# Patient Record
Sex: Female | Born: 1960 | Race: White | Hispanic: No | Marital: Married | Smoking: Never smoker
Health system: Southern US, Community
[De-identification: ages and names within clinical notes are randomized; demographics above are authoritative.]

## PROBLEM LIST (undated history)

## (undated) DIAGNOSIS — K219 Gastro-esophageal reflux disease without esophagitis: Secondary | ICD-10-CM

## (undated) DIAGNOSIS — E538 Deficiency of other specified B group vitamins: Secondary | ICD-10-CM

## (undated) DIAGNOSIS — C541 Malignant neoplasm of endometrium: Secondary | ICD-10-CM

---

## 2010-11-10 ENCOUNTER — Encounter (INDEPENDENT_AMBULATORY_CARE_PROVIDER_SITE_OTHER): Payer: BC Managed Care – PPO | Admitting: Ophthalmology

## 2010-11-10 DIAGNOSIS — H35439 Paving stone degeneration of retina, unspecified eye: Secondary | ICD-10-CM

## 2010-11-10 DIAGNOSIS — H2510 Age-related nuclear cataract, unspecified eye: Secondary | ICD-10-CM

## 2010-11-10 DIAGNOSIS — H43819 Vitreous degeneration, unspecified eye: Secondary | ICD-10-CM

## 2013-10-28 DIAGNOSIS — C541 Malignant neoplasm of endometrium: Secondary | ICD-10-CM | POA: Insufficient documentation

## 2017-10-04 ENCOUNTER — Encounter: Payer: Self-pay | Admitting: Emergency Medicine

## 2017-10-04 ENCOUNTER — Other Ambulatory Visit: Payer: Self-pay

## 2017-10-04 ENCOUNTER — Emergency Department
Admission: EM | Admit: 2017-10-04 | Discharge: 2017-10-04 | Disposition: A | Discharge status: Home / Self Care | Payer: Worker's Compensation | Attending: Emergency Medicine | Admitting: Emergency Medicine

## 2017-10-04 ENCOUNTER — Emergency Department: Payer: Worker's Compensation

## 2017-10-04 DIAGNOSIS — S8991XA Unspecified injury of right lower leg, initial encounter: Secondary | ICD-10-CM | POA: Diagnosis present

## 2017-10-04 DIAGNOSIS — W010XXA Fall on same level from slipping, tripping and stumbling without subsequent striking against object, initial encounter: Secondary | ICD-10-CM | POA: Insufficient documentation

## 2017-10-04 DIAGNOSIS — Y939 Activity, unspecified: Secondary | ICD-10-CM | POA: Diagnosis not present

## 2017-10-04 DIAGNOSIS — Y999 Unspecified external cause status: Secondary | ICD-10-CM | POA: Insufficient documentation

## 2017-10-04 DIAGNOSIS — G8911 Acute pain due to trauma: Secondary | ICD-10-CM | POA: Insufficient documentation

## 2017-10-04 DIAGNOSIS — S86911A Strain of unspecified muscle(s) and tendon(s) at lower leg level, right leg, initial encounter: Secondary | ICD-10-CM

## 2017-10-04 DIAGNOSIS — Z79899 Other long term (current) drug therapy: Secondary | ICD-10-CM | POA: Insufficient documentation

## 2017-10-04 DIAGNOSIS — S8391XA Sprain of unspecified site of right knee, initial encounter: Secondary | ICD-10-CM | POA: Diagnosis not present

## 2017-10-04 DIAGNOSIS — Y929 Unspecified place or not applicable: Secondary | ICD-10-CM | POA: Insufficient documentation

## 2017-10-04 DIAGNOSIS — M79651 Pain in right thigh: Secondary | ICD-10-CM | POA: Diagnosis not present

## 2017-10-04 DIAGNOSIS — R52 Pain, unspecified: Secondary | ICD-10-CM

## 2017-10-04 MED ORDER — TRAMADOL HCL 50 MG PO TABS: 50.0000 mg | ORAL_TABLET | Freq: Four times a day (QID) | ORAL | 0 refills | Status: DC | PRN

## 2017-10-04 NOTE — ED Triage Notes (Signed)
Brought in via ems s/p fall    States she developed a pulled a muscle in July  States was feel better  this am her right leg gave out and she fell  States pain is posterior and  Moves from hip into knee   Took 2 ibu PTA

## 2017-10-04 NOTE — ED Provider Notes (Signed)
[facility] Emergency Department Provider Note  ____________________________________________   First MD Initiated Contact with Patient 10/04/17 0900     (approximate)  I have reviewed the triage vital signs and the nursing notes.   HISTORY  Chief Complaint No chief complaint on file.    HPI Emily Soto is a 57 y.o. female presents to the emergency department via EMS after a fall.  Patient states that she pulled a muscle in July to her right leg.  She states that today her leg gave out from under her causing her to fall.  She states that she has pain posteriorly that moves from her hip into her knee.  Patient took 2 ibuprofen prior to arrival.  She denies any head injury or loss of consciousness during this event.  She rates her pain as a 10/10 and states she is unable to bear weight.  Reports that she was sitting in a chair on their arrival to the scene.   History reviewed. No pertinent past medical history.  There are no active problems to display for this patient.   History reviewed. No pertinent surgical history.  Prior to Admission medications   Medication Sig Start Date End Date Taking? Authorizing Provider  cholecalciferol (VITAMIN D) 1000 units tablet Take 1,000 Units by mouth daily.   Yes [provider]  esomeprazole (NEXIUM) 40 MG capsule Take 40 mg by mouth daily at 12 noon.   Yes [provider]  ranitidine (ZANTAC) 150 MG tablet Take 150 mg by mouth 2 (two) times daily.   Yes [provider]  traMADol (ULTRAM) 50 MG tablet Take 1 tablet (50 mg total) by mouth every 6 (six) hours as needed for moderate pain. 10/04/17   Johnn Hai, PA-C    Allergies Prednisone; Avelox [moxifloxacin hcl]; Epinephrine; and Morphine and related  No family history on file.  Social History Social History   Tobacco Use  . Smoking status: Never Smoker  . Smokeless tobacco: Never Used  Substance Use Topics  . Alcohol use:  Never    Frequency: Never  . Drug use: Never    Review of Systems Constitutional: No fever/chills Eyes: No visual changes. ENT: No sore throat. Cardiovascular: Denies chest pain. Respiratory: Denies shortness of breath. Gastrointestinal: No abdominal pain.  No nausea, no vomiting.  No diarrhea.  No constipation. Genitourinary: Negative for dysuria. Musculoskeletal: Negative for back pain. Skin: Negative for rash. Neurological: Negative for headaches, focal weakness or numbness.   ____________________________________________   PHYSICAL EXAM:  VITAL SIGNS: ED Triage Vitals  Enc Vitals Group     BP      Pulse      Resp      Temp      Temp src      SpO2      Weight      Height      Head Circumference      Peak Flow      Pain Score      Pain Loc      Pain Edu?      Excl. in [place]?    Constitutional: Alert and oriented. Well appearing and in no acute distress. Eyes: Conjunctivae are normal. PERRL. EOMI. Head: Atraumatic. Neck: No stridor.   Cardiovascular: Normal rate, regular rhythm. Grossly normal heart sounds.  Good peripheral circulation. Respiratory: Normal respiratory effort.  No retractions. Lungs CTAB. Musculoskeletal: On examination of the right hip and knee there is no gross deformity however there is large  body habitus which makes it difficult to completely evaluate.  There is no appreciated effusion of her right knee.  Range of motion is difficult to evaluate secondary to patient's pain.  Ligaments appear to be stable bilaterally.  No edema or deformity noted of the ankle and palpation of the anterior tib-fib is unremarkable.  Gait was not tested secondary to patient's pain. Neurologic:  Normal speech and language. No gross focal neurologic deficits are appreciated.  Skin:  Skin is warm, dry and intact.  No ecchymosis or abrasions were seen. Psychiatric: Mood and affect are normal. Speech and behavior are normal.  ____________________________________________     LABS (all labs ordered are listed, but only abnormal results are displayed)  Labs Reviewed - No data to display  RADIOLOGY   Official radiology report(s): No results found.  ____________________________________________   PROCEDURES  Procedure(s) performed: None  Procedures  Critical Care performed: No  ____________________________________________   INITIAL IMPRESSION / ASSESSMENT AND PLAN / ED COURSE  As part of my medical decision making, I reviewed the following data within the electronic MEDICAL RECORD NUMBER Notes from prior ED visits and [place] Controlled Substance Database  Patient was placed in a knee immobilizer and given crutches.  She was offered a tramadol in the emergency department which she refused stating that she has strange reactions to medications and would prefer to take the first 1 at home.  Her husband also tried to encourage her to take it here in the emergency room as she appeared to be in a great deal of pain but patient still refused.  Patient is to follow-up with Dr. Marry Guan who is on-call for orthopedics if any continued problems.  Is encouraged to ice and elevate and wear the knee immobilizer.  ____________________________________________   FINAL CLINICAL IMPRESSION(S) / ED DIAGNOSES  Final diagnoses:  Strain of right knee and leg, initial encounter  Acute pain     ED Discharge Orders         Ordered    traMADol (ULTRAM) 50 MG tablet  Every 6 hours PRN     10/04/17 1015           Note:  This document was prepared using Dragon voice recognition software and may include unintentional dictation errors.    Johnn Hai, PA-C 10/07/17 1032    Darel Hong, MD 10/07/17 1743

## 2017-10-04 NOTE — Discharge Instructions (Addendum)
Ice and elevate as needed for pain.  You may also continue taking ibuprofen/Advil 3 times a day with food.  Tylenol can be used for additional pain relief.  Wear knee immobilizer for support and use crutches when walking to prevent further injury. Follow-up with Dr. Marry Guan who is the orthopedist on call.  You will need to call make an appointment with his office.

## 2018-12-09 ENCOUNTER — Inpatient Hospital Stay: Payer: BC Managed Care – PPO

## 2018-12-09 ENCOUNTER — Observation Stay (HOSPITAL_BASED_OUTPATIENT_CLINIC_OR_DEPARTMENT_OTHER)
Admit: 2018-12-09 | Discharge: 2018-12-09 | Disposition: A | Discharge status: Home / Self Care | Payer: BC Managed Care – PPO | Attending: Internal Medicine | Admitting: Internal Medicine

## 2018-12-09 ENCOUNTER — Emergency Department: Payer: BC Managed Care – PPO

## 2018-12-09 ENCOUNTER — Other Ambulatory Visit: Payer: Self-pay

## 2018-12-09 ENCOUNTER — Observation Stay: Payer: BC Managed Care – PPO

## 2018-12-09 ENCOUNTER — Encounter: Payer: Self-pay | Admitting: Internal Medicine

## 2018-12-09 ENCOUNTER — Inpatient Hospital Stay
Admission: EM | Admit: 2018-12-09 | Discharge: 2018-12-10 | DRG: 065 | Disposition: A | Discharge status: Home / Self Care | Payer: BC Managed Care – PPO | Attending: Internal Medicine | Admitting: Internal Medicine

## 2018-12-09 DIAGNOSIS — E785 Hyperlipidemia, unspecified: Secondary | ICD-10-CM | POA: Diagnosis present

## 2018-12-09 DIAGNOSIS — K219 Gastro-esophageal reflux disease without esophagitis: Secondary | ICD-10-CM | POA: Diagnosis present

## 2018-12-09 DIAGNOSIS — Z20828 Contact with and (suspected) exposure to other viral communicable diseases: Secondary | ICD-10-CM | POA: Diagnosis present

## 2018-12-09 DIAGNOSIS — Z6841 Body Mass Index (BMI) 40.0 and over, adult: Secondary | ICD-10-CM | POA: Diagnosis not present

## 2018-12-09 DIAGNOSIS — G8194 Hemiplegia, unspecified affecting left nondominant side: Secondary | ICD-10-CM | POA: Diagnosis present

## 2018-12-09 DIAGNOSIS — E876 Hypokalemia: Secondary | ICD-10-CM | POA: Diagnosis present

## 2018-12-09 DIAGNOSIS — R29898 Other symptoms and signs involving the musculoskeletal system: Secondary | ICD-10-CM | POA: Diagnosis present

## 2018-12-09 DIAGNOSIS — R29700 NIHSS score 0: Secondary | ICD-10-CM | POA: Diagnosis present

## 2018-12-09 DIAGNOSIS — R918 Other nonspecific abnormal finding of lung field: Secondary | ICD-10-CM | POA: Diagnosis present

## 2018-12-09 DIAGNOSIS — Z8542 Personal history of malignant neoplasm of other parts of uterus: Secondary | ICD-10-CM

## 2018-12-09 DIAGNOSIS — I6389 Other cerebral infarction: Secondary | ICD-10-CM | POA: Diagnosis not present

## 2018-12-09 DIAGNOSIS — Z880 Allergy status to penicillin: Secondary | ICD-10-CM | POA: Diagnosis not present

## 2018-12-09 DIAGNOSIS — Z801 Family history of malignant neoplasm of trachea, bronchus and lung: Secondary | ICD-10-CM | POA: Diagnosis not present

## 2018-12-09 DIAGNOSIS — E669 Obesity, unspecified: Secondary | ICD-10-CM | POA: Diagnosis present

## 2018-12-09 DIAGNOSIS — I639 Cerebral infarction, unspecified: Secondary | ICD-10-CM | POA: Diagnosis present

## 2018-12-09 HISTORY — DX: Deficiency of other specified B group vitamins: E53.8

## 2018-12-09 HISTORY — DX: Gastro-esophageal reflux disease without esophagitis: K21.9

## 2018-12-09 HISTORY — DX: Malignant neoplasm of endometrium: C54.1

## 2018-12-09 LAB — LIPID PANEL
Cholesterol: 223 mg/dL — ABNORMAL HIGH (ref 0–200)
HDL: 51 mg/dL (ref >40)
LDL Cholesterol: 160 mg/dL — ABNORMAL HIGH (ref 0–99)
Total CHOL/HDL Ratio: 4.4 RATIO
Triglycerides: 60 mg/dL (ref <150)
VLDL: 12 mg/dL (ref 0–40)

## 2018-12-09 LAB — URINALYSIS, COMPLETE (UACMP) WITH MICROSCOPIC
Bilirubin Urine: NEGATIVE
Glucose, UA: NEGATIVE mg/dL
Hgb urine dipstick: NEGATIVE
Ketones, ur: NEGATIVE mg/dL
Nitrite: NEGATIVE
Protein, ur: NEGATIVE mg/dL
Specific Gravity, Urine: 1.004 — ABNORMAL LOW (ref 1.005–1.030)
pH: 6 (ref 5.0–8.0)

## 2018-12-09 LAB — ETHANOL: Alcohol, Ethyl (B): <10 mg/dL (ref <10)

## 2018-12-09 LAB — CBC WITH DIFFERENTIAL/PLATELET
Abs Immature Granulocytes: 0.02 10*3/uL (ref 0.00–0.07)
Basophils Absolute: 0.1 10*3/uL (ref 0.0–0.1)
Basophils Relative: 1 %
Eosinophils Absolute: 0.3 10*3/uL (ref 0.0–0.5)
Eosinophils Relative: 3 %
HCT: 43.4 % (ref 36.0–46.0)
Hemoglobin: 14.3 g/dL (ref 12.0–15.0)
Immature Granulocytes: 0 %
Lymphocytes Relative: 40 %
Lymphs Abs: 3.5 10*3/uL (ref 0.7–4.0)
MCH: 29.2 pg (ref 26.0–34.0)
MCHC: 32.9 g/dL (ref 30.0–36.0)
MCV: 88.8 fL (ref 80.0–100.0)
Monocytes Absolute: 0.5 10*3/uL (ref 0.1–1.0)
Monocytes Relative: 6 %
Neutro Abs: 4.6 10*3/uL (ref 1.7–7.7)
Neutrophils Relative %: 50 %
Platelets: 211 10*3/uL (ref 150–400)
RBC: 4.89 MIL/uL (ref 3.87–5.11)
RDW: 13.6 % (ref 11.5–15.5)
WBC: 8.9 10*3/uL (ref 4.0–10.5)
nRBC: 0 % (ref 0.0–0.2)

## 2018-12-09 LAB — URINE DRUG SCREEN, QUALITATIVE (ARMC ONLY)
Amphetamines, Ur Screen: NOT DETECTED
Barbiturates, Ur Screen: NOT DETECTED
Benzodiazepine, Ur Scrn: NOT DETECTED
Cannabinoid 50 Ng, Ur ~~LOC~~: NOT DETECTED
Cocaine Metabolite,Ur ~~LOC~~: NOT DETECTED
MDMA (Ecstasy)Ur Screen: NOT DETECTED
Methadone Scn, Ur: NOT DETECTED
Opiate, Ur Screen: NOT DETECTED
Phencyclidine (PCP) Ur S: NOT DETECTED
Tricyclic, Ur Screen: NOT DETECTED

## 2018-12-09 LAB — PROTIME-INR
INR: 0.9 (ref 0.8–1.2)
Prothrombin Time: 12.4 seconds (ref 11.4–15.2)

## 2018-12-09 LAB — SARS CORONAVIRUS 2 (TAT 6-24 HRS): SARS Coronavirus 2: NEGATIVE

## 2018-12-09 LAB — COMPREHENSIVE METABOLIC PANEL
ALT: 10 U/L (ref 0–44)
AST: 19 U/L (ref 15–41)
Albumin: 4.2 g/dL (ref 3.5–5.0)
Alkaline Phosphatase: 71 U/L (ref 38–126)
Anion gap: 11 (ref 5–15)
BUN: 16 mg/dL (ref 6–20)
CO2: 24 mmol/L (ref 22–32)
Calcium: 9 mg/dL (ref 8.9–10.3)
Chloride: 107 mmol/L (ref 98–111)
Creatinine, Ser: 0.87 mg/dL (ref 0.44–1.00)
GFR calc Af Amer: >60 mL/min (ref >60)
GFR calc non Af Amer: >60 mL/min (ref >60)
Glucose, Bld: 131 mg/dL — ABNORMAL HIGH (ref 70–99)
Potassium: 3.2 mmol/L — ABNORMAL LOW (ref 3.5–5.1)
Sodium: 142 mmol/L (ref 135–145)
Total Bilirubin: 0.6 mg/dL (ref 0.3–1.2)
Total Protein: 7.2 g/dL (ref 6.5–8.1)

## 2018-12-09 LAB — HEMOGLOBIN A1C
Hgb A1c MFr Bld: 5.5 % (ref 4.8–5.6)
Mean Plasma Glucose: 111.15 mg/dL

## 2018-12-09 LAB — ECHOCARDIOGRAM COMPLETE
Height: 67 in
Weight: 4257.6 oz

## 2018-12-09 LAB — HIV ANTIBODY (ROUTINE TESTING W REFLEX): HIV Screen 4th Generation wRfx: NONREACTIVE

## 2018-12-09 MED ORDER — ATORVASTATIN CALCIUM 20 MG PO TABS
40.0000 mg | ORAL_TABLET | Freq: Every day | ORAL | Status: DC
  Administered 2018-12-09: 40 mg via ORAL
  Filled 2018-12-09: qty 2

## 2018-12-09 MED ORDER — PANTOPRAZOLE SODIUM 40 MG PO TBEC
40.0000 mg | DELAYED_RELEASE_TABLET | Freq: Every day | ORAL | Status: DC
  Administered 2018-12-09 – 2018-12-10 (×2): 40 mg via ORAL
  Filled 2018-12-09 (×2): qty 1

## 2018-12-09 MED ORDER — ACETAMINOPHEN 325 MG PO TABS: 650.0000 mg | ORAL_TABLET | ORAL | Status: DC | PRN

## 2018-12-09 MED ORDER — ASPIRIN 325 MG PO TABS
325.0000 mg | ORAL_TABLET | Freq: Every day | ORAL | Status: DC
  Administered 2018-12-10: 325 mg via ORAL
  Filled 2018-12-09: qty 1

## 2018-12-09 MED ORDER — IOHEXOL 350 MG/ML SOLN
75.0000 mL | Freq: Once | INTRAVENOUS | Status: AC | PRN
  Administered 2018-12-09: 16:00:00 75 mL via INTRAVENOUS

## 2018-12-09 MED ORDER — STROKE: EARLY STAGES OF RECOVERY BOOK
Freq: Once | Status: AC
  Administered 2018-12-09: 09:00:00

## 2018-12-09 MED ORDER — ENOXAPARIN SODIUM 40 MG/0.4ML ~~LOC~~ SOLN
40.0000 mg | Freq: Two times a day (BID) | SUBCUTANEOUS | Status: DC
  Administered 2018-12-09 – 2018-12-10 (×2): 40 mg via SUBCUTANEOUS
  Filled 2018-12-09 (×3): qty 0.4

## 2018-12-09 MED ORDER — ASPIRIN 81 MG PO CHEW
324.0000 mg | CHEWABLE_TABLET | Freq: Once | ORAL | Status: AC
  Administered 2018-12-09: 324 mg via ORAL
  Filled 2018-12-09: qty 4

## 2018-12-09 MED ORDER — ONDANSETRON HCL 4 MG/2ML IJ SOLN: 4.0000 mg | Freq: Four times a day (QID) | INTRAMUSCULAR | Status: DC | PRN

## 2018-12-09 MED ORDER — POTASSIUM CHLORIDE CRYS ER 20 MEQ PO TBCR
40.0000 meq | EXTENDED_RELEASE_TABLET | Freq: Once | ORAL | Status: AC
  Administered 2018-12-09: 40 meq via ORAL
  Filled 2018-12-09: qty 2

## 2018-12-09 MED ORDER — ACETAMINOPHEN 650 MG RE SUPP: 650.0000 mg | RECTAL | Status: DC | PRN

## 2018-12-09 MED ORDER — ASPIRIN 300 MG RE SUPP: 300.0000 mg | Freq: Every day | RECTAL | Status: DC

## 2018-12-09 MED ORDER — ACETAMINOPHEN 160 MG/5ML PO SOLN
650.0000 mg | ORAL | Status: DC | PRN
  Filled 2018-12-09: qty 20.3

## 2018-12-09 NOTE — Evaluation (Signed)
Occupational Therapy Evaluation Patient Details Name: Emily Soto MRN: [id number] DOB: Jul 30, 1960 Today's Date: 12/09/2018    History of Present Illness 58 y.o. female noticed left-sided numbness when she got up this morning, mild. MRI pending.   Clinical Impression   Pt seen for OT evaluation this date. 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 Currently pt endorses LUE feeling "not right" and demonstrates mild impairments in fine motor coordination impacting her ADL and IADL ability. Pt also noted that over the past 3 months or so, she has experienced increased fatigue and intermittent weakness of bilat UEs and LEs along with associated tingling in the back of her neck. 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 RN notified. Functional mobility assessment limited 2/2 staff in room to take pt down for MRI. Per SLP who saw pt prior to OT, pt ambulating without difficulty. Will continue to assess.  Pt would benefit from skilled OT to address noted impairments and functional limitations (see below for any additional details) in order to maximize safety and independence while minimizing falls risk and caregiver burden.  Upon hospital discharge, recommend pt discharge with OP OT services to address LUE. Will continue to assess.     Follow Up Recommendations  Outpatient OT    Equipment Recommendations  None recommended by OT    Recommendations for Other Services       Precautions / Restrictions Precautions Precautions: None Restrictions Weight Bearing Restrictions: No      Mobility Bed Mobility               General bed mobility comments: deferred, staff came to take pt for MRI  Transfers                 General transfer comment: deferred, staff came to take pt for MRI    Balance Overall balance  assessment: (unable to formally assess 2/2 being taken for MRI, per SLP who saw pt, pt was ambulating without difficulty in room prior to OT's arrival)                                         ADL either performed or assessed with clinical judgement   ADL Overall ADL's : Needs assistance/impaired                                       General ADL Comments: supervision assist when performing while in standing for safety, but generally near baseline (assessment shortened 2/2 being taken out for MRI)     Vision Baseline Vision/History: Wears glasses Wears Glasses: At all times Patient Visual Report: No change from baseline Vision Assessment?: No apparent visual deficits     Perception     Praxis      Pertinent Vitals/Pain Pain Assessment: No/denies pain     Hand Dominance Right   Extremity/Trunk Assessment Upper Extremity Assessment Upper Extremity Assessment: LUE deficits/detail(RUE WFL) LUE Deficits / Details: strength 5/5, mild FMC deficits noted with testing, no drift, no sensory deficits LUE Sensation: WNL LUE Coordination: decreased fine motor   Lower Extremity Assessment Lower Extremity Assessment: Defer to PT evaluation(unable to formally test 2/2  pt going down for MRI)   Cervical / Trunk Assessment Cervical / Trunk Assessment: Other exceptions Cervical / Trunk Exceptions: pt endorses intermittent tingling on the back of her neck over the past 3 months with intermittent bilateral mild weakness noted; RN notified   Communication Communication Communication: No difficulties   Cognition Arousal/Alertness: Awake/alert Behavior During Therapy: WFL for tasks assessed/performed Overall Cognitive Status: Within Functional Limits for tasks assessed                                     General Comments       Exercises Other Exercises Other Exercises: Pt/spouse briefly instructed in [facility] ex for LUE; will bringh [facility] ex  handout for pt   Shoulder Instructions      Home Living Family/patient expects to be discharged to:: Private residence Living Arrangements: Spouse/significant other Available Help at Discharge: Family;Available 24 hours/day Type of Home: House Home Access: Stairs to enter [employer] of Steps: 5-7 Entrance Stairs-Rails: Right;Left Home Layout: Two level;Bed/bath upstairs     Bathroom Shower/Tub: Tub only;Walk-in shower   Bathroom Toilet: Standard     Home Equipment: None          Prior Functioning/Environment Level of Independence: Independent        Comments: Pt indep, working as a Pharmacist, 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, writing, white board work, Data processing manager work), 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        OT Problem List: Decreased coordination      OT Treatment/Interventions: Field seismologist;Therapeutic exercise;Therapeutic activities;Neuromuscular education;DME and/or AE instruction;Patient/family education    OT Goals(Current goals can be found in the care plan section) Acute Rehab OT Goals Patient Stated Goal: 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 Goal Formulation: With patient/family Time For Goal Achievement: 12/23/18 Potential to Achieve Goals: Good ADL Goals Pt/caregiver will Perform Home Exercise Program: Left upper extremity;With written HEP provided;Independently(LUE FMC)  OT Frequency: Min 1X/week   Barriers to D/C:            Co-evaluation              AM-PAC OT "6 Clicks" Daily Activity     Outcome Measure Help from another person eating meals?: None Help from another person taking care of personal grooming?: None Help from another person toileting, which includes using toliet, bedpan, or urinal?: None Help from another person bathing (including washing, rinsing, drying)?: None Help from another person to put on and taking off regular upper body clothing?: None Help from another person to put on and taking off regular lower  body clothing?: None 6 Click Score: 24   End of Session    Activity Tolerance: Patient tolerated treatment well Patient left: in bed;with call bell/phone within reach;with family/visitor present;with nursing/sitter in room  OT Visit Diagnosis: Other abnormalities of gait and mobility (R26.89);Hemiplegia and hemiparesis Hemiplegia - Right/Left: Left Hemiplegia - dominant/non-dominant: Non-Dominant Hemiplegia - caused by: Unspecified                Time: [id number] OT Time Calculation (min): 10 min Charges:  OT General Charges $OT Visit: 1 Visit OT Evaluation $OT Eval Low Complexity: 1 Low  Jeni Salles, MPH, MS, OTR/L ascom [phone] 12/09/18, 10:51 AM

## 2018-12-09 NOTE — Progress Notes (Signed)
*  PRELIMINARY RESULTS* Echocardiogram 2D Echocardiogram has been performed.  Sherrie Sport 12/09/2018, 1:56 PM

## 2018-12-09 NOTE — Evaluation (Signed)
Physical Therapy Evaluation Patient Details Name: Emily Soto MRN: [id number] DOB: [birth year] Today's Date: 12/09/2018   History of Present Illness  From MD H&P:  Pt is a 58 y.o. female with a known history of GERD, B12 deficiency presented with left arm and leg weakness that she noticed on waking up.  MD assessment includes: small right frontoparietal acute infarct, left subclavian artery stenosis, Hypokalemia, and HLD.    Clinical Impression  Pt presented with min deficits in LLE functional strength, gait, balance, and activity tolerance.  No deficits noted during MMT LLE vs RLE but pt reported feeling slightly weaker on the LLE during gait, transfers, and stair training.  No deficits with VOR, visual tracking, or peripheral vision.  Sensation to light touch intact to all extremities. Coordination nearly equal L vs R to UEs and LEs with pt noting that fine motor felt more challenging on the LUE compared to the RUE.  Pt was Ind with all bed mobility tasks and demonstrated good control during transfers.  Pt was able to amb 200' with a combination of using a RW initially and then progressing to using one UE on a rail in the hall.  Pt reported feeling steadier and safer with the RW and ambulated with slow cadence and short B step length with both.  Pt did show some functional weakness descending stairs with only fair eccentric control but was safe with the use of the rails.  Will see patient 2x/wk secondary to pt being at a high level functionally.  Pt will benefit from OPPT services upon discharge to safely address above deficits for decreased caregiver assistance and eventual return to PLOF.        Follow Up Recommendations Outpatient PT    Equipment Recommendations  Rolling walker with 5" wheels    Recommendations for Other Services       Precautions / Restrictions Precautions Precautions: None Restrictions Weight Bearing Restrictions: No      Mobility  Bed Mobility Overal bed  mobility: Independent             General bed mobility comments: Good speed and effort with all bed mobility tasks  Transfers Overall transfer level: Independent               General transfer comment: Good eccentric and concentric control with sit to/from stand transfers  Ambulation/Gait Ambulation/Gait assistance: Supervision Gait Distance (Feet): 200 Feet Assistive device: Rolling walker (2 wheeled) Gait Pattern/deviations: Step-through pattern;Decreased step length - right;Decreased step length - left Gait velocity: decreased   General Gait Details: Cautious cadence with decreased BLE step length but steady without LOB both with the using a RW and with only one UE on a hand rail in the hall  Stairs Stairs: Yes Stairs assistance: Min guard;Supervision Stair Management: Two rails Number of Stairs: 4 General stair comments: Good concentric control ascending stairs and fair eccentric control desending; min verbal and visual cues for proper sequencing  Wheelchair Mobility    Modified Rankin (Stroke Patients Only)       Balance Overall balance assessment: Needs assistance   Sitting balance-Leahy Scale: Normal     Standing balance support: Bilateral upper extremity supported;No upper extremity supported;During functional activity Standing balance-Leahy Scale: Good Standing balance comment: (-) Romberg sign               High Level Balance Comments: Pt steady with feet apart and together with combinations of head turns/head still and eyes open/shut  Pertinent Vitals/Pain Pain Assessment: No/denies pain    Home Living Family/patient expects to be discharged to:: Private residence Living Arrangements: Spouse/significant other Available Help at Discharge: Family;Available 24 hours/day Type of Home: House Home Access: Stairs to enter Entrance Stairs-Rails: Right;Left(can not reach both) Entrance Stairs-Number of Steps: 5-7 Home Layout:  Two level;Bed/bath upstairs Home Equipment: None Additional Comments: Pt said could stay on 1st floor but would only have a couch to sleep on and a 1/2 bath    Prior Function Level of Independence: Independent         Comments: Pt indep, working as a Pharmacist, 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, writing, white board work, Data processing manager work), 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     Hand Dominance   Dominant Hand: Right    Extremity/Trunk Assessment   Upper Extremity Assessment Upper Extremity Assessment: Overall WFL for tasks assessed;RUE deficits/detail;LUE deficits/detail RUE Deficits / Details: Strenth 5/5 RUE Sensation: WNL RUE Coordination: WNL LUE Deficits / Details: Strength 5/5; Pt reports feeling that finger to nose test was more challenging on the LUE but visually was WNL and only minimally slower than her dominant R hand LUE Sensation: WNL LUE Coordination: WNL    Lower Extremity Assessment Lower Extremity Assessment: RLE deficits/detail;LLE deficits/detail RLE Deficits / Details: Strength 5/5 RLE Sensation: WNL RLE Coordination: WNL LLE Deficits / Details: Strength 5/5 LLE Sensation: WNL LLE Coordination: WNL       Communication   Communication: No difficulties  Cognition Arousal/Alertness: Awake/alert Behavior During Therapy: WFL for tasks assessed/performed Overall Cognitive Status: Within Functional Limits for tasks assessed                                        General Comments      Exercises Other Exercises Other Exercises: Pt steady with feet apart and together with combinations of head turns/head still and eyes open/shut   Assessment/Plan    PT Assessment Patient needs continued PT services  PT Problem List Decreased knowledge of use of DME;Decreased balance       PT Treatment Interventions DME instruction;Gait training;Stair training;Functional mobility training;Therapeutic activities;Therapeutic exercise;Balance training;Patient/family education    PT  Goals (Current goals can be found in the Care Plan section)  Acute Rehab PT Goals Patient Stated Goal: "I want my left arm and leg to get back to normal" PT Goal Formulation: With patient Time For Goal Achievement: 12/22/18 Potential to Achieve Goals: Good    Frequency Min 2X/week   Barriers to discharge        Co-evaluation               AM-PAC PT "6 Clicks" Mobility  Outcome Measure Help needed turning from your back to your side while in a flat bed without using bedrails?: None Help needed moving from lying on your back to sitting on the side of a flat bed without using bedrails?: None Help needed moving to and from a bed to a chair (including a wheelchair)?: None Help needed standing up from a chair using your arms (e.g., wheelchair or bedside chair)?: None Help needed to walk in hospital room?: A Little Help needed climbing 3-5 steps with a railing? : A Little 6 Click Score: 22    End of Session Equipment Utilized During Treatment: Gait belt Activity Tolerance: Patient tolerated treatment well Patient left: in bed;with nursing/sitter in room;with bed alarm set;with call bell/phone within reach Nurse Communication: Mobility status  PT Visit Diagnosis: Difficulty in walking, not elsewhere classified (R26.2);Unsteadiness on feet (R26.81)    Time: [id number] PT Time Calculation (min) (ACUTE ONLY): 46 min   Charges:   PT Evaluation $PT Eval Moderate Complexity: 1 Mod PT Treatments $Gait Training: 8-22 mins $Therapeutic Exercise: 8-22 mins        D. Scott Filippo Puls PT, DPT 12/09/18, 5:20 PM

## 2018-12-09 NOTE — Progress Notes (Signed)
SLP Cancellation Note  Patient Details Name: Emily Soto MRN: [id number] DOB: 1960-12-16   Cancelled treatment:       Reason Eval/Treat Not Completed: SLP screened, no needs identified, will sign off(chart reviewed; consulted NSG then met w/ pt/spouse). Pt denied any difficulty swallowing and is currently on a regular diet; tolerates swallowing pills w/ water per NSG. Pt conversed at conversational level w/out deficits noted; pt and spouse denied any speech-language deficits. Pt independently used personal cell phone to call [employer] for a breakfast meal while in room.  No further skilled ST services indicated as pt appears at her baseline. Pt agreed. NSG to reconsult if any change in status while admitted.    Orinda Kenner, MS, CCC-SLP Queena Monrreal 12/09/2018, 11:05 AM

## 2018-12-09 NOTE — Consult Note (Signed)
Referring Physician: Sudini    Chief Complaint: Left arm weakness  HPI: Emily Soto is an 58 y.o. female with a history of B12 deficiency who reports going to bed at baseline on yesterday.  Awakened today and noted difficulty using the left hand and difficulty walking.  Symptoms did not resolve and patient presented for evaluation.  Initial NIHSS of 0.    Date last known well: Date: 12/08/2018 Time last known well: Time: 22:30 tPA Given: No: Outside time window  Past Medical History:  Diagnosis Date  . B12 deficiency   . Endometrial cancer ([place])   . GERD (gastroesophageal reflux disease)     History reviewed. No pertinent surgical history.  Family History  Problem Relation Age of Onset  . Lung cancer Father    Social History:  reports that she has never smoked. She has never used smokeless tobacco. She reports that she does not drink alcohol or use drugs.  Allergies:  Allergies  Allergen Reactions  . Penicillins Hives  . Prednisone Other (See Comments)    Causes tingling to face and increased heart rate  . Avelox [Moxifloxacin Hcl] Palpitations  . Epinephrine Palpitations  . Morphine And Related Palpitations    Medications:  I have reviewed the patient's current medications. Prior to Admission:  Medications Prior to Admission  Medication Sig Dispense Refill Last Dose  . esomeprazole (NEXIUM) 40 MG capsule Take 40 mg by mouth daily at 12 noon.   Past Week at Unknown time  . VITAMIN D-VITAMIN K PO Take 2,000 Units by mouth daily.   12/08/2018 at 1000   Scheduled: . [START ON 12/10/2018] aspirin  300 mg Rectal Daily   Or  . [START ON 12/10/2018] aspirin  325 mg Oral Daily  . atorvastatin  40 mg Oral q1800  . enoxaparin (LOVENOX) injection  40 mg Subcutaneous Q12H  . pantoprazole  40 mg Oral Daily    ROS: History obtained from the patient  General ROS: negative for - chills, fatigue, fever, night sweats, weight gain or weight loss Psychological ROS: negative for  - behavioral disorder, hallucinations, memory difficulties, mood swings or suicidal ideation Ophthalmic ROS: negative for - blurry vision, double vision, eye pain or loss of vision ENT ROS: negative for - epistaxis, nasal discharge, oral lesions, sore throat, tinnitus or vertigo Allergy and Immunology ROS: negative for - hives or itchy/watery eyes Hematological and Lymphatic ROS: negative for - bleeding problems, bruising or swollen lymph nodes Endocrine ROS: negative for - galactorrhea, hair pattern changes, polydipsia/polyuria or temperature intolerance Respiratory ROS: negative for - cough, hemoptysis, shortness of breath or wheezing Cardiovascular ROS: negative for - chest pain, dyspnea on exertion, edema or irregular heartbeat Gastrointestinal ROS: negative for - abdominal pain, diarrhea, hematemesis, nausea/vomiting or stool incontinence Genito-Urinary ROS: negative for - dysuria, hematuria, incontinence or urinary frequency/urgency Musculoskeletal ROS: negative for - joint swelling or muscular weakness Neurological ROS: as noted in HPI Dermatological ROS: negative for rash and skin lesion changes  Physical Examination: Blood pressure 113/71, pulse 73, temperature 98.1 F (36.7 C), temperature source Oral, resp. rate 18, height 5\' 7"  (1.702 m), weight 120.7 kg, SpO2 97 %.  HEENT-  Normocephalic, no lesions, without obvious abnormality.  Normal external eye and conjunctiva.  Normal TM's bilaterally.  Normal auditory canals and external ears. Normal external nose, mucus membranes and septum.  Normal pharynx. Cardiovascular- S1, S2 normal, pulses palpable throughout   Lungs- chest clear, no wheezing, rales, normal symmetric air entry Abdomen- soft, non-tender; bowel sounds normal;  no masses,  no organomegaly Extremities- no edema Lymph-no adenopathy palpable Musculoskeletal-no joint tenderness, deformity or swelling Skin-warm and dry, no hyperpigmentation, vitiligo, or suspicious  lesions  Neurological Examination   Mental Status: Alert, oriented, thought content appropriate.  Speech fluent without evidence of aphasia.  Able to follow 3 step commands without difficulty. Cranial Nerves: II: Discs flat bilaterally; Visual fields grossly normal, pupils equal, round, reactive to light and accommodation III,IV, VI: ptosis not present, extra-ocular motions intact bilaterally V,VII: smile symmetric, facial light touch sensation normal bilaterally VIII: hearing normal bilaterally IX,X: gag reflex present XI: bilateral shoulder shrug XII: midline tongue extension Motor: Right : Upper extremity   5/5    Left:     Upper extremity   5-/5 with RUE drift  Lower extremity   5/5     Lower extremity   5/5 Tone and bulk:normal tone throughout; no atrophy noted Sensory: Pinprick and light touch intact throughout, bilaterally Deep Tendon Reflexes: Symmetric throughout Plantars: Right: downgoing   Left: downgoing Cerebellar: Normal finger-to-nose and normal heel-to-shin testing bilaterally Gait: not tested due to safety concerns  Laboratory Studies:  Basic Metabolic Panel: Recent Labs  Lab 12/09/18 0625  NA 142  K 3.2*  CL 107  CO2 24  GLUCOSE 131*  BUN 16  CREATININE 0.87  CALCIUM 9.0    Liver Function Tests: Recent Labs  Lab 12/09/18 0625  AST 19  ALT 10  ALKPHOS 71  BILITOT 0.6  PROT 7.2  ALBUMIN 4.2   No results for input(s): LIPASE, AMYLASE in the last 168 hours. No results for input(s): AMMONIA in the last 168 hours.  CBC: Recent Labs  Lab 12/09/18 0625  WBC 8.9  NEUTROABS 4.6  HGB 14.3  HCT 43.4  MCV 88.8  PLT 211    Cardiac Enzymes: No results for input(s): CKTOTAL, CKMB, CKMBINDEX, TROPONINI in the last 168 hours.  BNP: Invalid input(s): POCBNP  CBG: No results for input(s): GLUCAP in the last 168 hours.  Microbiology: No results found for this or any previous visit.  Coagulation Studies: Recent Labs    12/09/18 0625   LABPROT 12.4  INR 0.9    Urinalysis:  Recent Labs  Lab 12/09/18 0748  COLORURINE STRAW*  LABSPEC 1.004*  PHURINE 6.0  GLUCOSEU NEGATIVE  HGBUR NEGATIVE  BILIRUBINUR NEGATIVE  KETONESUR NEGATIVE  PROTEINUR NEGATIVE  NITRITE NEGATIVE  LEUKOCYTESUR TRACE*    Lipid Panel:    Component Value Date/Time   CHOL 223 (H) 12/09/2018 0816   TRIG 60 12/09/2018 0816   HDL 51 12/09/2018 0816   CHOLHDL 4.4 12/09/2018 0816   VLDL 12 12/09/2018 0816   LDLCALC 160 (H) 12/09/2018 0816    HgbA1C:  Lab Results  Component Value Date   HGBA1C 5.5 12/09/2018    Urine Drug Screen:      Component Value Date/Time   LABOPIA NONE DETECTED 12/09/2018 0748   COCAINSCRNUR NONE DETECTED 12/09/2018 0748   LABBENZ NONE DETECTED 12/09/2018 0748   AMPHETMU NONE DETECTED 12/09/2018 0748   THCU NONE DETECTED 12/09/2018 0748   LABBARB NONE DETECTED 12/09/2018 0748    Alcohol Level:  Recent Labs  Lab 12/09/18 0625  ETH <10    Other results: EKG: sinus rhythm at 87 bpm.  Imaging: Ct Head Wo Contrast  Result Date: 12/09/2018 CLINICAL DATA:  New onset extremity numbness and weakness during the night. EXAM: CT HEAD WITHOUT CONTRAST TECHNIQUE: Contiguous axial images were obtained from the base of the skull through the vertex without intravenous  contrast. COMPARISON:  None. FINDINGS: Brain: No evidence of acute infarction, hemorrhage, hydrocephalus, extra-axial collection or mass lesion/mass effect. Mild patchy low-density in the cerebral white matter. No specific demyelinating pattern. Normal brain volume. Vascular: No hyperdense vessel or unexpected calcification. Skull: Normal. Negative for fracture or focal lesion. Sinuses/Orbits: No acute finding. IMPRESSION: 1. No acute finding. 2. Mild cerebral white matter disease without specific pattern. Electronically Signed   By: Monte Fantasia M.D.   On: 12/09/2018 07:10   Mr Angio Neck Wo Contrast  Result Date: 12/09/2018 CLINICAL DATA:  Ataxia.  Stroke suspected. Left-sided numbness beginning today. Negative head CT. EXAM: MRI HEAD WITHOUT CONTRAST MRA NECK WITHOUT  CONTRAST TECHNIQUE: Multiplanar, multiecho pulse sequences of the brain and surrounding structures were obtained without intravenous contrast. Angiographic images of the neck were obtained using MRA technique without intravenous contrast. Carotid stenosis measurements (when applicable) are obtained utilizing NASCET criteria, using the distal internal carotid diameter as the denominator. COMPARISON:  Head CT same day FINDINGS: MRI HEAD FINDINGS Brain: Diffusion imaging shows a 1 x 1.5 cm acute infarction in the deep white matter of the right frontoparietal region, possibly serving the precentral gyrus. No large acute infarction. Elsewhere, the brainstem and cerebellum are normal. Cerebral hemispheres show moderate chronic small-vessel ischemic changes the white matter. No mass, hemorrhage, hydrocephalus or extra-axial collection. Vascular: Major vessels at the base of the brain show flow. 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 Both internal carotid arteries are patent to the bifurcation regions. Both carotid bifurcations appear normal without stenosis or irregularity. Both cervical internal carotid arteries are normal. The right vertebral artery is a large vessel widely patent through the cervical region, through the foramen magnum to the basilar. Left vertebral artery is not visible with antegrade flow. This could be due to either occlusion or retrograde flow due to stenosis or occlusion of the left subclavian artery. IMPRESSION: Background pattern of moderate chronic small-vessel ischemic change of the white matter. 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 No swelling or  hemorrhage. Noncontrast neck MR angiography was ordered and performed. I think there is a right aortic arch with mirror image branching. No carotid bifurcation disease is seen. No flow is seen in the left subclavian artery or the left vertebral artery. This could be due to occlusion or severe stenosis/retrograde flow. This would probably be better evaluated by CT angiography with contrast. Alternatively, MR angiography with contrast could be utilized. Electronically Signed   By: Nelson Chimes M.D.   On: 12/09/2018 11:29   Mr Brain Wo Contrast  Result Date: 12/09/2018 CLINICAL DATA:  Ataxia. Stroke suspected. Left-sided numbness beginning today. Negative head CT. EXAM: MRI HEAD WITHOUT CONTRAST MRA NECK WITHOUT  CONTRAST TECHNIQUE: Multiplanar, multiecho pulse sequences of the brain and surrounding structures were obtained without intravenous contrast. Angiographic images of the neck were obtained using MRA technique without intravenous contrast. Carotid stenosis measurements (when applicable) are obtained utilizing NASCET criteria, using the distal internal carotid diameter as the denominator. COMPARISON:  Head CT same day FINDINGS: MRI HEAD FINDINGS Brain: Diffusion imaging shows a 1 x 1.5 cm acute infarction in the deep white matter of the right frontoparietal region, possibly serving the precentral gyrus. No large acute infarction. Elsewhere, the brainstem and cerebellum are normal. Cerebral hemispheres show moderate chronic small-vessel ischemic changes the white matter.  No mass, hemorrhage, hydrocephalus or extra-axial collection. Vascular: Major vessels at the base of the brain show flow. 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 Both internal carotid arteries are patent to the bifurcation regions. Both carotid bifurcations appear normal without stenosis or irregularity. Both cervical internal  carotid arteries are normal. The right vertebral artery is a large vessel widely patent through the cervical region, through the foramen magnum to the basilar. Left vertebral artery is not visible with antegrade flow. This could be due to either occlusion or retrograde flow due to stenosis or occlusion of the left subclavian artery. IMPRESSION: Background pattern of moderate chronic small-vessel ischemic change of the white matter. 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 No swelling or hemorrhage. Noncontrast neck MR angiography was ordered and performed. I think there is a right aortic arch with mirror image branching. No carotid bifurcation disease is seen. No flow is seen in the left subclavian artery or the left vertebral artery. This could be due to occlusion or severe stenosis/retrograde flow. This would probably be better evaluated by CT angiography with contrast. Alternatively, MR angiography with contrast could be utilized. Electronically Signed   By: Nelson Chimes M.D.   On: 12/09/2018 11:29    Assessment: 58 y.o. female with history of B12 deficiency presenting with LUE weakness.  MRI of the brain reviewed and shows a small right frontoparietal, acute infarct.  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  Infarct may be secondary to emboli from more proximal disease.  Further work up recommended.  A1c 5.5.  LDL 160  Stroke Risk Factors - hyperlipidemia  Plan: 1. CTA of the head and neck 2. PT consult, OT consult, Speech consult 3. Echocardiogram with bubble study pending 4. Prophylactic therapy-Dual antiplatelet therapy with ASA 81mg  and Plavix 75mg  for three weeks with change to ASA 81mg  alone as monotherapy after that time. 5. NPO until RN stroke swallow screen 6. Telemetry monitoring 7. Frequent neuro checks 8.  Statin for lipid management with target LDL<70.   Alexis Goodell, MD Neurology [phone] 12/09/2018, 3:19 PM

## 2018-12-09 NOTE — ED Triage Notes (Signed)
Patient reports went to sleep around 10:30 pm feeling fine, woke at 5:15 am with numb feel left arm and left leg.  Reports thought it was because that was the side she slept on so took shower and later realized feeling not going away and that left arm and left felt weaker than right and that grip was different.  Patient denies facial droop or other concerns.

## 2018-12-09 NOTE — H&P (Signed)
[place] [facility] [place] NAME: Emily Soto    MR#:  [id number]  DATE OF BIRTH:  03-Oct-1960  DATE OF ADMISSION:  12/09/2018  PRIMARY CARE PHYSICIAN: Rusty Aus, MD   REQUESTING/REFERRING PHYSICIAN: Dr. Jacqualine Code  CHIEF COMPLAINT:   Chief Complaint  Patient presents with  . Numbness    HISTORY OF PRESENT ILLNESS:  Emily Soto  is a 58 y.o. female with a known history of GERD, B12 deficiency presented with left arm and leg weakness that she noticed on waking up at 5.30 AM. Slept at 10.30 PM . Difficulty walking. Afebrile Ct head normal. No pain or fever  PAST MEDICAL HISTORY:   Past Medical History:  Diagnosis Date  . B12 deficiency   . Endometrial cancer ([place])   . GERD (gastroesophageal reflux disease)     PAST SURGICAL HISTORY:  No past surgical history on file.  SOCIAL HISTORY:   Social History   Tobacco Use  . Smoking status: Never Smoker  . Smokeless tobacco: Never Used  Substance Use Topics  . Alcohol use: Never    Frequency: Never    FAMILY HISTORY:   Family History  Problem Relation Age of Onset  . Lung cancer Father     DRUG ALLERGIES:   Allergies  Allergen Reactions  . Penicillins Hives  . Prednisone Other (See Comments)    Causes tingling to face and increased heart rate  . Avelox [Moxifloxacin Hcl] Palpitations  . Epinephrine Palpitations  . Morphine And Related Palpitations    REVIEW OF SYSTEMS:   Review of Systems  Constitutional: Positive for malaise/fatigue. Negative for chills and fever.  HENT: Negative for sore throat.   Eyes: Negative for blurred vision, double vision and pain.  Respiratory: Negative for cough, hemoptysis, shortness of breath and wheezing.   Cardiovascular: Negative for chest pain, palpitations, orthopnea and leg swelling.  Gastrointestinal: Negative for abdominal pain, constipation, diarrhea, heartburn, nausea and vomiting.  Genitourinary: Negative for dysuria and  hematuria.  Musculoskeletal: Negative for back pain and joint pain.  Skin: Negative for rash.  Neurological: Positive for focal weakness. Negative for sensory change, speech change and headaches.  Endo/Heme/Allergies: Does not bruise/bleed easily.  Psychiatric/Behavioral: Negative for depression. The patient is not nervous/anxious.     MEDICATIONS AT HOME:   Prior to Admission medications   Medication Sig Start Date End Date Taking? Authorizing Provider  esomeprazole (NEXIUM) 40 MG capsule Take 40 mg by mouth daily at 12 noon.   Yes [provider]  VITAMIN D-VITAMIN K PO Take 2,000 Units by mouth daily.   Yes [provider]     VITAL SIGNS:  Blood pressure 105/70, pulse 67, temperature 98.3 F (36.8 C), temperature source Oral, resp. rate 18, height 5\' 7"  (1.702 m), weight 120.7 kg, SpO2 99 %.  PHYSICAL EXAMINATION:  Physical Exam  GENERAL:  58 y.o.-year-old patient lying in the bed with no acute distress.  EYES: Pupils equal, round, reactive to light and accommodation. No scleral icterus. Extraocular muscles intact.  HEENT: Head atraumatic, normocephalic. Oropharynx and nasopharynx clear. No oropharyngeal erythema, moist oral mucosa  NECK:  Supple, no jugular venous distention. No thyroid enlargement, no tenderness.  LUNGS: Normal breath sounds bilaterally, no wheezing, rales, rhonchi. No use of accessory muscles of respiration.  CARDIOVASCULAR: S1, S2 normal. No murmurs, rubs, or gallops.  ABDOMEN: Soft, nontender, nondistended. Bowel sounds present. No organomegaly or mass.  EXTREMITIES: No pedal edema, cyanosis, or clubbing. + 2 pedal &  radial pulses b/l.   NEUROLOGIC: Cranial nerves II through XII are intact. No focal Motor or sensory deficits appreciated b/l PSYCHIATRIC: The patient is alert and oriented x 3. Good affect.  SKIN: No obvious rash, lesion, or ulcer.   LABORATORY PANEL:   CBC Recent Labs  Lab 12/09/18 0625  WBC 8.9  HGB 14.3  HCT 43.4   PLT 211   ------------------------------------------------------------------------------------------------------------------  Chemistries  Recent Labs  Lab 12/09/18 0625  NA 142  K 3.2*  CL 107  CO2 24  GLUCOSE 131*  BUN 16  CREATININE 0.87  CALCIUM 9.0  AST 19  ALT 10  ALKPHOS 71  BILITOT 0.6   ------------------------------------------------------------------------------------------------------------------  Cardiac Enzymes No results for input(s): TROPONINI in the last 168 hours. ------------------------------------------------------------------------------------------------------------------  RADIOLOGY:  Ct Head Wo Contrast  Result Date: 12/09/2018 CLINICAL DATA:  New onset extremity numbness and weakness during the night. EXAM: CT HEAD WITHOUT CONTRAST TECHNIQUE: Contiguous axial images were obtained from the base of the skull through the vertex without intravenous contrast. COMPARISON:  None. FINDINGS: Brain: No evidence of acute infarction, hemorrhage, hydrocephalus, extra-axial collection or mass lesion/mass effect. Mild patchy low-density in the cerebral white matter. No specific demyelinating pattern. Normal brain volume. Vascular: No hyperdense vessel or unexpected calcification. Skull: Normal. Negative for fracture or focal lesion. Sinuses/Orbits: No acute finding. IMPRESSION: 1. No acute finding. 2. Mild cerebral white matter disease without specific pattern. Electronically Signed   By: Monte Fantasia M.D.   On: 12/09/2018 07:10   IMPRESSION AND PLAN:   * Acute right frontal CVA Start ASA, lipitor Check ECHO. MRA neck with nothing acute PT/OT/Speech eval Neuro consulted  *Left subclavian vein occlusion?  Findings concerning on MRA of the neck.  Will need CT a with contrast.  Patient tells me she is very sensitive to medications and has to think about contrast.  Advised to let us know if she wants to get the testing patient will discuss with her primary care  physician for outpatient eval.  *Hypokalemia.  Replace orally.  *Hyperlipidemia.  LDL 160.  Start Lipitor.  *DVT prophylaxis with Lovenox  All the records are reviewed and case discussed with ED provider. Management plans discussed with the patient, family and they are in agreement.  CODE STATUS: FULL CODE  TOTAL TIME TAKING CARE OF THIS PATIENT: 40 minutes.   Leia Alf Arnett Galindez M.D on 12/09/2018 at 9:48 AM  Between 7am to 6pm - Pager - [phone]  After 6pm go to [email or web address] - password [email or web address] [facility]  Office  [phone]  CC: Primary care physician; Rusty Aus, MD  Note: This dictation was prepared with Dragon dictation along with smaller phrase technology. Any transcriptional errors that result from this process are unintentional.

## 2018-12-09 NOTE — ED Provider Notes (Signed)
[facility] Emergency Department Provider Note ____________________________________________   First MD Initiated Contact with Patient 12/09/18 0719     (approximate)  I have reviewed the triage vital signs and the nursing notes.   HISTORY  Chief Complaint Numbness  HPI Emily Soto is a 58 y.o. female noticed left-sided numbness when she got up this morning, mild.  Also some difficulty with the left hand little bit of difficulty with walking.  She last felt well about 1030 last night when she went to bed.  She did go to the bathroom at some point during the night but she is not really sure at all what time.  She knows she was well last at 10:30 PM.  Symptoms were noticed when she woke up.  No pain.  No fevers no chills.  No nausea or vomiting.  No recent illness.  Past Medical History:  Diagnosis Date  . B12 deficiency   . Endometrial cancer ([place])   . GERD (gastroesophageal reflux disease)     There are no active problems to display for this patient.   No past surgical history on file.  Prior to Admission medications   Medication Sig Start Date End Date Taking? Authorizing Provider  cholecalciferol (VITAMIN D) 1000 units tablet Take 1,000 Units by mouth daily.    [provider]  esomeprazole (NEXIUM) 40 MG capsule Take 40 mg by mouth daily at 12 noon.    [provider]  ranitidine (ZANTAC) 150 MG tablet Take 150 mg by mouth 2 (two) times daily.    [provider]  traMADol (ULTRAM) 50 MG tablet Take 1 tablet (50 mg total) by mouth every 6 (six) hours as needed for moderate pain. 10/04/17   Johnn Hai, PA-C    Allergies Penicillins, Prednisone, Avelox [moxifloxacin hcl], Epinephrine, and Morphine and related  Family History  Problem Relation Age of Onset  . Lung cancer Father     Social History Social History   Tobacco Use  . Smoking status: Never Smoker  . Smokeless tobacco: Never Used  Substance Use  Topics  . Alcohol use: Never    Frequency: Never  . Drug use: Never    Review of Systems Constitutional: No fever/chills Eyes: No visual changes. ENT: No sore throat.  Little bit of a tingling in the back of her neck has been present off and on in the past Cardiovascular: Denies chest pain. Respiratory: Denies shortness of breath. Gastrointestinal: No abdominal pain.   Genitourinary: Negative for dysuria. Musculoskeletal: Negative for back pain. Skin: Negative for rash. Neurological: Negative for headaches, areas of focal weakness or numbness.    ____________________________________________   PHYSICAL EXAM:  VITAL SIGNS: ED Triage Vitals [12/09/18 0623]  Enc Vitals Group     BP 138/61     Pulse Rate 89     Resp 12     Temp      Temp src      SpO2 100 %     Weight 260 lb (117.9 kg)     Height 5\' 7"  (1.702 m)     Head Circumference      Peak Flow      Pain Score 0     Pain Loc      Pain Edu?      Excl. in [place]?     Constitutional: Alert and oriented. Well appearing and in no acute distress. Eyes: Conjunctivae are normal. Head: Atraumatic. Nose: No congestion/rhinnorhea. Mouth/Throat: Mucous membranes are moist. Neck: No  stridor.  Cardiovascular: Normal rate, regular rhythm. Grossly normal heart sounds.  Good peripheral circulation. Respiratory: Normal respiratory effort.  No retractions. Lungs CTAB. Gastrointestinal: Soft and nontender. No distention. Musculoskeletal: No lower extremity tenderness nor edema. Neurologic:  Normal speech and language. No gross focal neurologic deficits are appreciated except for slight weakness left grip strength slight discoordination of left hand also severely minimal potential weakness in the left lower extremity leg raise compared to the right but somewhat disc felt to be certain.  Cranial nerves normal.  Extraocular movements normal.  Fields normal. Skin:  Skin is warm, dry and intact. No rash noted. Psychiatric: Mood and affect  are normal. Speech and behavior are normal.  Lucianne Lei negative for LVO ____________________________________________   LABS (all labs ordered are listed, but only abnormal results are displayed)  Labs Reviewed  COMPREHENSIVE METABOLIC PANEL - Abnormal; Notable for the following components:      Result Value   Potassium 3.2 (*)    Glucose, Bld 131 (*)    All other components within normal limits  SARS CORONAVIRUS 2 (TAT 6-24 HRS)  CBC WITH DIFFERENTIAL/PLATELET  ETHANOL  PROTIME-INR  URINE DRUG SCREEN, QUALITATIVE ([facility] ONLY)  URINALYSIS, COMPLETE ([facility]) WITH MICROSCOPIC   ____________________________________________  EKG  Reviewed entered by me at 630 Heart rate 99 QRS 100 QTc 450 Normal sinus rhythm, minimal nonspecific ST depression is extremely minimal seen in precordial leads only, nonspecific ____________________________________________  RADIOLOGY  Ct Head Wo Contrast  Result Date: 12/09/2018 CLINICAL DATA:  New onset extremity numbness and weakness during the night. EXAM: CT HEAD WITHOUT CONTRAST TECHNIQUE: Contiguous axial images were obtained from the base of the skull through the vertex without intravenous contrast. COMPARISON:  None. FINDINGS: Brain: No evidence of acute infarction, hemorrhage, hydrocephalus, extra-axial collection or mass lesion/mass effect. Mild patchy low-density in the cerebral white matter. No specific demyelinating pattern. Normal brain volume. Vascular: No hyperdense vessel or unexpected calcification. Skull: Normal. Negative for fracture or focal lesion. Sinuses/Orbits: No acute finding. IMPRESSION: 1. No acute finding. 2. Mild cerebral white matter disease without specific pattern. Electronically Signed   By: Monte Fantasia M.D.   On: 12/09/2018 07:10    CT reviewed negative for acute findings ____________________________________________   PROCEDURES  Procedure(s) performed: None  Procedures  Critical Care performed: No   ____________________________________________   INITIAL IMPRESSION / ASSESSMENT AND PLAN / ED COURSE  Pertinent labs & imaging results that were available during my care of the patient were reviewed by me and considered in my medical decision making (see chart for details).   Patient last well 1030 last night.  Woke up this morning with left arm slight weakness, slight discoordination, feeling off.  Examination demonstrates slight weakness in the left hand, also a little bit of difficulty with coordination utilizing the left hand.  Also very slight left lower leg weakness compared to the right.  Clinical examination reveals slight discoordination involving the left hand and just slightly perceived weakness in the left grip strength versus right.     Admission discussed with Dr. Darvin Neighbours.  Anticipate patient will need further work-up and neurology consultation, etiology is not yet 100% clear, but I suspect a potentially very small stroke without large vessel occlusion could be a potential possibility. ____________________________________________   FINAL CLINICAL IMPRESSION(S) / ED DIAGNOSES  Final diagnoses:  Left arm weakness        Note:  This document was prepared using Dragon voice recognition software and may include unintentional dictation errors  Delman Kitten, MD 12/09/18 [phone]

## 2018-12-09 NOTE — ED Notes (Signed)
Patient transported to CT 

## 2018-12-10 ENCOUNTER — Inpatient Hospital Stay: Payer: BC Managed Care – PPO

## 2018-12-10 MED ORDER — ASPIRIN EC 81 MG PO TBEC: 81.0000 mg | DELAYED_RELEASE_TABLET | Freq: Every day | ORAL | Status: DC

## 2018-12-10 MED ORDER — CLOPIDOGREL BISULFATE 75 MG PO TABS
75.0000 mg | ORAL_TABLET | Freq: Every day | ORAL | Status: DC
  Administered 2018-12-10: 15:00:00 75 mg via ORAL
  Filled 2018-12-10: qty 1

## 2018-12-10 MED ORDER — ASPIRIN 81 MG PO TBEC: 81.0000 mg | DELAYED_RELEASE_TABLET | Freq: Every day | ORAL | 0 refills | Status: AC

## 2018-12-10 MED ORDER — CLOPIDOGREL BISULFATE 75 MG PO TABS: 75.0000 mg | ORAL_TABLET | Freq: Every day | ORAL | 0 refills | Status: DC

## 2018-12-10 MED ORDER — ATORVASTATIN CALCIUM 40 MG PO TABS: 40.0000 mg | ORAL_TABLET | Freq: Every day | ORAL | 0 refills | Status: DC

## 2018-12-10 MED ORDER — ALPRAZOLAM 0.25 MG PO TABS: 0.2500 mg | ORAL_TABLET | Freq: Two times a day (BID) | ORAL | 0 refills | Status: DC | PRN

## 2018-12-10 MED ORDER — ALPRAZOLAM 0.25 MG PO TABS: 0.2500 mg | ORAL_TABLET | Freq: Two times a day (BID) | ORAL | Status: DC | PRN

## 2018-12-10 NOTE — Progress Notes (Addendum)
Subjective: No new neurological complaints.    Objective: Current vital signs: BP 130/74 (BP Location: Right Arm)   Pulse 85   Temp 98.5 F (36.9 C) (Oral)   Resp 17   Ht 5\' 7"  (1.702 m)   Wt 120.7 kg   SpO2 100%   BMI 41.68 kg/m  Vital signs in last 24 hours: Temp:  [97.7 F (36.5 C)-98.5 F (36.9 C)] 98.5 F (36.9 C) (10/27 1200) Pulse Rate:  [61-85] 85 (10/27 1200) Resp:  [17-18] 17 (10/27 1200) BP: (104-136)/(56-99) 130/74 (10/27 1200) SpO2:  [97 %-100 %] 100 % (10/27 1200)  Intake/Output from previous day: 10/26 0701 - 10/27 0700 In: 240 [P.O.:240] Out: -  Intake/Output this shift: No intake/output data recorded. Nutritional status:  Diet Order            Diet - low sodium heart healthy        Diet Heart Room service appropriate? Yes; Fluid consistency: Thin  Diet effective now              Neurologic Exam: Mental Status: Alert, oriented, thought content appropriate.  Speech fluent without evidence of aphasia.  Able to follow 3 step commands without difficulty. Cranial Nerves: II: Discs flat bilaterally; Visual fields grossly normal, pupils equal, round, reactive to light and accommodation III,IV, VI: ptosis not present, extra-ocular motions intact bilaterally V,VII: smile symmetric, facial light touch sensation normal bilaterally VIII: hearing normal bilaterally IX,X: gag reflex present XI: bilateral shoulder shrug XII: midline tongue extension Motor: Right :  Upper extremity   5/5                                      Left:     Upper extremity   5-/5 with RUE drift             Lower extremity   5/5                                                  Lower extremity   5/5 Tone and bulk:normal tone throughout; no atrophy noted Sensory: Pinprick and light touch intact throughout, bilaterally   Lab Results: Basic Metabolic Panel: Recent Labs  Lab 12/09/18 0625  NA 142  K 3.2*  CL 107  CO2 24  GLUCOSE 131*  BUN 16  CREATININE 0.87  CALCIUM 9.0     Liver Function Tests: Recent Labs  Lab 12/09/18 0625  AST 19  ALT 10  ALKPHOS 71  BILITOT 0.6  PROT 7.2  ALBUMIN 4.2   No results for input(s): LIPASE, AMYLASE in the last 168 hours. No results for input(s): AMMONIA in the last 168 hours.  CBC: Recent Labs  Lab 12/09/18 0625  WBC 8.9  NEUTROABS 4.6  HGB 14.3  HCT 43.4  MCV 88.8  PLT 211    Cardiac Enzymes: No results for input(s): CKTOTAL, CKMB, CKMBINDEX, TROPONINI in the last 168 hours.  Lipid Panel: Recent Labs  Lab 12/09/18 0816  CHOL 223*  TRIG 60  HDL 51  CHOLHDL 4.4  VLDL 12  LDLCALC 160*    CBG: No results for input(s): GLUCAP in the last 168 hours.  Microbiology: Results for orders placed or performed during the hospital encounter of 12/09/18  SARS CORONAVIRUS 2 (TAT  6-24 HRS) Nasopharyngeal Nasopharyngeal Swab     Status: None   Collection Time: 12/09/18  7:48 AM   Specimen: Nasopharyngeal Swab  Result Value Ref Range Status   SARS Coronavirus 2 NEGATIVE NEGATIVE Final    Comment: (NOTE) SARS-CoV-2 target nucleic acids are NOT DETECTED. The SARS-CoV-2 RNA is generally detectable in upper and lower respiratory specimens during the acute phase of infection. Negative results do not preclude SARS-CoV-2 infection, do not rule out co-infections with other pathogens, and should not be used as the sole basis for treatment or other patient management decisions. Negative results must be combined with clinical observations, patient history, and epidemiological information. The expected result is Negative. Fact Sheet for Patients: SugarRoll.be Fact Sheet for Healthcare Providers: https://www.woods-mathews.com/ This test is not yet approved or cleared by the [place] FDA and  has been authorized for detection and/or diagnosis of SARS-CoV-2 by FDA under an Emergency Use Authorization (EUA). This EUA will remain  in effect (meaning this test can be used)  for the duration of the COVID-19 declaration under Section 56 4(b)(1) of the Act, 21 U.S.C. section 360bbb-3(b)(1), unless the authorization is terminated or revoked sooner. Performed at [facility], [place] [address], [place], [place] [address]     Coagulation Studies: Recent Labs    12/09/18 [id number]  LABPROT 12.4  INR 0.9    Imaging: Ct Angio Head W Or Wo Contrast  Addendum Date: 12/09/2018   ADDENDUM REPORT: 12/09/2018 18:14 ADDENDUM: These results were called by telephone at the time of interpretation on 12/09/2018 at 6:13 pm to provider Dr. Mindi Slicker, who verbally acknowledged these results. Electronically Signed   By: Kellie Simmering DO   On: 12/09/2018 18:14   Result Date: 12/09/2018 CLINICAL DATA:  Stroke, follow-up EXAM: CT ANGIOGRAPHY HEAD AND NECK TECHNIQUE: Multidetector CT imaging of the head and neck was performed using the standard protocol during bolus administration of intravenous contrast. Multiplanar CT image reconstructions and MIPs were obtained to evaluate the vascular anatomy. Carotid stenosis measurements (when applicable) are obtained utilizing NASCET criteria, using the distal internal carotid diameter as the denominator. CONTRAST:  68mL OMNIPAQUE IOHEXOL 350 MG/ML SOLN COMPARISON:  Noncontrast head CT 12/09/2018, MRI and MRA neck 12/09/2018 FINDINGS: CT HEAD FINDINGS An acute right frontoparietal white matter infarct was better appreciated on brain MRI performed earlier the same day. No new acute demarcated cortical infarction is identified. No evidence of intracranial hemorrhage. No midline shift or extra-axial fluid collection. Redemonstrated chronic small vessel ischemic disease. Vascular: Reported separately Skull: Normal. Negative for fracture or focal lesion. Sinuses: No significant paranasal sinus disease or mastoid effusion. Orbits: Visualized orbits demonstrate no acute abnormality. Review of the MIP images confirms the above findings CTA NECK FINDINGS Aortic  arch: A right aortic arch is present. Streak artifact from a dense contrast bolus limits assessment of aortic branching. There appears to be a right subclavian artery which gives rise to the right vertebral. There may be a common origin of the common carotid arteries. The proximal left subclavian artery is poorly delineated and may be occluded. There are collateral vessels extending from the distal left subclavian artery inferiorly within the medial left thorax. The left vertebral artery arises from the left subclavian artery. Right carotid system: CCA and ICA widely patent within the neck without stenosis. No significant atherosclerotic disease. Left carotid system: Streak artifact obscures the origin of the left common carotid artery. Otherwise, the CCA and ICA are widely patent within the neck without stenosis Vertebral arteries:  Right vertebral artery dominant. The vertebral arteries are patent within the neck bilaterally without stenosis. Skeleton: No acute bony abnormality. Cervical spondylosis greatest at C4-C5 and C5-C6 where there is moderate/severe disc degeneration and small posterior disc osteophytes. Other neck: No definite cervical lymphadenopathy. Several small calcifications Upper chest: 1.3 cm left upper lobe nodule (series 10, image 72). Additionally, there is an incompletely imaged left upper lobe mass (series 10, images 32-36). 9 mm right thyroid lobe nodule with peripheral calcifications. Review of the MIP images confirms the above findings CTA HEAD FINDINGS Anterior circulation: Mild mixed plaque within the intracranial internal carotid arteries without stenosis. The bilateral middle and anterior cerebral arteries are patent without significant proximal stenosis. No intracranial aneurysm is identified. Posterior circulation: The intracranial vertebral arteries are patent without significant stenosis. Mild atherosclerotic irregularity of the basilar artery without significant stenosis.  Predominantly fetal origin of the bilateral posterior cerebral arteries. The posterior cerebral arteries are patent bilaterally without significant stenosis. Venous sinuses: Within limitations of contrast timing, no convincing thrombus. Dominant right transverse and sigmoid dural venous sinuses. Anatomic variants: As described Review of the MIP images confirms the above findings IMPRESSION: CT head: 1. An acute right frontoparietal white matter infarct was better appreciated on brain MRI performed earlier the same day. 2. Chronic small vessel ischemic disease. CTA head: 1. No intracranial large vessel occlusion or high-grade proximal arterial stenosis. Mild intracranial atherosclerotic disease. 2. Retrograde flow within the non dominant left vertebral artery as detailed under the CTA neck impression section. CTA neck: 1. Right-sided aortic arch. Streak artifact from a dense contrast bolus limits assessment of aortic branching. Suspected aortic branching as described. 2. The proximal left subclavian artery is poorly delineated and may be occluded. Collateral vessels extend from the distal left subclavian artery inferiorly within the medial left thorax. 3. The bilateral vertebral arteries are patent within the neck without stenosis. Given findings on same-day MRA neck, there is retrograde flow within the non dominant left vertebral artery (which arises from the left subclavian artery). 4. Streak artifact limits evaluation of the origin of the left common carotid artery. Otherwise, the common carotid and internal carotid arteries are patent within the neck bilaterally without stenosis. 5. 1.3 cm left upper lobe nodule. Additional incompletely imaged left upper lobe mass. Dedicated chest CT is recommended for further evaluation. Electronically Signed: By: Kellie Simmering DO On: 12/09/2018 17:58   Ct Head Wo Contrast  Result Date: 12/09/2018 CLINICAL DATA:  New onset extremity numbness and weakness during the night.  EXAM: CT HEAD WITHOUT CONTRAST TECHNIQUE: Contiguous axial images were obtained from the base of the skull through the vertex without intravenous contrast. COMPARISON:  None. FINDINGS: Brain: No evidence of acute infarction, hemorrhage, hydrocephalus, extra-axial collection or mass lesion/mass effect. Mild patchy low-density in the cerebral white matter. No specific demyelinating pattern. Normal brain volume. Vascular: No hyperdense vessel or unexpected calcification. Skull: Normal. Negative for fracture or focal lesion. Sinuses/Orbits: No acute finding. IMPRESSION: 1. No acute finding. 2. Mild cerebral white matter disease without specific pattern. Electronically Signed   By: Monte Fantasia M.D.   On: 12/09/2018 07:10   Ct Angio Neck W Or Wo Contrast  Addendum Date: 12/09/2018   ADDENDUM REPORT: 12/09/2018 18:14 ADDENDUM: These results were called by telephone at the time of interpretation on 12/09/2018 at 6:13 pm to provider Dr. Mindi Slicker, who verbally acknowledged these results. Electronically Signed   By: Kellie Simmering DO   On: 12/09/2018 18:14   Result Date: 12/09/2018 CLINICAL  DATA:  Stroke, follow-up EXAM: CT ANGIOGRAPHY HEAD AND NECK TECHNIQUE: Multidetector CT imaging of the head and neck was performed using the standard protocol during bolus administration of intravenous contrast. Multiplanar CT image reconstructions and MIPs were obtained to evaluate the vascular anatomy. Carotid stenosis measurements (when applicable) are obtained utilizing NASCET criteria, using the distal internal carotid diameter as the denominator. CONTRAST:  68mL OMNIPAQUE IOHEXOL 350 MG/ML SOLN COMPARISON:  Noncontrast head CT 12/09/2018, MRI and MRA neck 12/09/2018 FINDINGS: CT HEAD FINDINGS An acute right frontoparietal white matter infarct was better appreciated on brain MRI performed earlier the same day. No new acute demarcated cortical infarction is identified. No evidence of intracranial hemorrhage. No midline shift or  extra-axial fluid collection. Redemonstrated chronic small vessel ischemic disease. Vascular: Reported separately Skull: Normal. Negative for fracture or focal lesion. Sinuses: No significant paranasal sinus disease or mastoid effusion. Orbits: Visualized orbits demonstrate no acute abnormality. Review of the MIP images confirms the above findings CTA NECK FINDINGS Aortic arch: A right aortic arch is present. Streak artifact from a dense contrast bolus limits assessment of aortic branching. There appears to be a right subclavian artery which gives rise to the right vertebral. There may be a common origin of the common carotid arteries. The proximal left subclavian artery is poorly delineated and may be occluded. There are collateral vessels extending from the distal left subclavian artery inferiorly within the medial left thorax. The left vertebral artery arises from the left subclavian artery. Right carotid system: CCA and ICA widely patent within the neck without stenosis. No significant atherosclerotic disease. Left carotid system: Streak artifact obscures the origin of the left common carotid artery. Otherwise, the CCA and ICA are widely patent within the neck without stenosis Vertebral arteries: Right vertebral artery dominant. The vertebral arteries are patent within the neck bilaterally without stenosis. Skeleton: No acute bony abnormality. Cervical spondylosis greatest at C4-C5 and C5-C6 where there is moderate/severe disc degeneration and small posterior disc osteophytes. Other neck: No definite cervical lymphadenopathy. Several small calcifications Upper chest: 1.3 cm left upper lobe nodule (series 10, image 72). Additionally, there is an incompletely imaged left upper lobe mass (series 10, images 32-36). 9 mm right thyroid lobe nodule with peripheral calcifications. Review of the MIP images confirms the above findings CTA HEAD FINDINGS Anterior circulation: Mild mixed plaque within the intracranial  internal carotid arteries without stenosis. The bilateral middle and anterior cerebral arteries are patent without significant proximal stenosis. No intracranial aneurysm is identified. Posterior circulation: The intracranial vertebral arteries are patent without significant stenosis. Mild atherosclerotic irregularity of the basilar artery without significant stenosis. Predominantly fetal origin of the bilateral posterior cerebral arteries. The posterior cerebral arteries are patent bilaterally without significant stenosis. Venous sinuses: Within limitations of contrast timing, no convincing thrombus. Dominant right transverse and sigmoid dural venous sinuses. Anatomic variants: As described Review of the MIP images confirms the above findings IMPRESSION: CT head: 1. An acute right frontoparietal white matter infarct was better appreciated on brain MRI performed earlier the same day. 2. Chronic small vessel ischemic disease. CTA head: 1. No intracranial large vessel occlusion or high-grade proximal arterial stenosis. Mild intracranial atherosclerotic disease. 2. Retrograde flow within the non dominant left vertebral artery as detailed under the CTA neck impression section. CTA neck: 1. Right-sided aortic arch. Streak artifact from a dense contrast bolus limits assessment of aortic branching. Suspected aortic branching as described. 2. The proximal left subclavian artery is poorly delineated and may be occluded. Collateral vessels extend  from the distal left subclavian artery inferiorly within the medial left thorax. 3. The bilateral vertebral arteries are patent within the neck without stenosis. Given findings on same-day MRA neck, there is retrograde flow within the non dominant left vertebral artery (which arises from the left subclavian artery). 4. Streak artifact limits evaluation of the origin of the left common carotid artery. Otherwise, the common carotid and internal carotid arteries are patent within the  neck bilaterally without stenosis. 5. 1.3 cm left upper lobe nodule. Additional incompletely imaged left upper lobe mass. Dedicated chest CT is recommended for further evaluation. Electronically Signed: By: Kellie Simmering DO On: 12/09/2018 17:58   Ct Chest Wo Contrast  Result Date: 12/10/2018 CLINICAL DATA:  Lung nodule on CTA of the neck. History of endometrial cancer. Nonsmoker. EXAM: CT CHEST WITHOUT CONTRAST TECHNIQUE: Multidetector CT imaging of the chest was performed following the standard protocol without IV contrast. 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 Aortic atherosclerosis. Normal heart size, without pericardial effusion. Mediastinum/Nodes: No supraclavicular adenopathy. No axillary adenopathy. No mediastinal or definite hilar adenopathy, given limitations of unenhanced CT. Lungs/Pleura: No pleural fluid. Pleural-based right lower lung mass, including at 4.4 x 4.3 cm on 103/3. Left apical pleural-based pulmonary nodule measures 1.1 cm on 21/3. A relatively well circumscribed, rounded left upper lobe lung mass measures 4.0 x 3.7 cm on 53/3. Upper Abdomen: Cholecystectomy. Normal imaged portions of the spleen, liver, stomach, pancreas, adrenal glands, kidneys. Musculoskeletal: Thoracic spondylosis. IMPRESSION: 1. Bilateral pulmonary lesions, including left upper and right lower lobe dominant lung masses. Given morphology and clinical history, favored to represent metastatic disease. One or more primary bronchogenic carcinomas felt less likely. 2. No thoracic adenopathy. 3. Right aortic arch. 4.  Aortic Atherosclerosis (ICD10-I70.0). Electronically Signed   By: Abigail Miyamoto M.D.   On: 12/10/2018 12:54   Mr Angio Neck Wo Contrast  Result Date: 12/09/2018 CLINICAL DATA:  Ataxia. Stroke suspected. Left-sided numbness beginning today.  Negative head CT. EXAM: MRI HEAD WITHOUT CONTRAST MRA NECK WITHOUT  CONTRAST TECHNIQUE: Multiplanar, multiecho pulse sequences of the brain and surrounding structures were obtained without intravenous contrast. Angiographic images of the neck were obtained using MRA technique without intravenous contrast. Carotid stenosis measurements (when applicable) are obtained utilizing NASCET criteria, using the distal internal carotid diameter as the denominator. COMPARISON:  Head CT same day FINDINGS: MRI HEAD FINDINGS Brain: Diffusion imaging shows a 1 x 1.5 cm acute infarction in the deep white matter of the right frontoparietal region, possibly serving the precentral gyrus. No large acute infarction. Elsewhere, the brainstem and cerebellum are normal. Cerebral hemispheres show moderate chronic small-vessel ischemic changes the white matter. No mass, hemorrhage, hydrocephalus or extra-axial collection. Vascular: Major vessels at the base of the brain show flow. 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 Both internal carotid arteries are patent to the bifurcation regions. Both carotid bifurcations appear normal without stenosis or irregularity. Both cervical internal carotid arteries are normal. The right vertebral artery is a large vessel widely patent through the cervical region, through the foramen magnum to the basilar. Left vertebral artery is not visible with antegrade flow. This could be due to either occlusion or retrograde flow due to stenosis or occlusion of the left subclavian artery. IMPRESSION: Background pattern of moderate chronic small-vessel ischemic change of  the white matter. 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 No swelling or hemorrhage. Noncontrast neck MR angiography was ordered  and performed. I think there is a right aortic arch with mirror image branching. No carotid bifurcation disease is seen. No flow is seen in the left subclavian artery or the left vertebral artery. This could be due to occlusion or severe stenosis/retrograde flow. This would probably be better evaluated by CT angiography with contrast. Alternatively, MR angiography with contrast could be utilized. Electronically Signed   By: Nelson Chimes M.D.   On: 12/09/2018 11:29   Mr Brain Wo Contrast  Result Date: 12/09/2018 CLINICAL DATA:  Ataxia. Stroke suspected. Left-sided numbness beginning today. Negative head CT. EXAM: MRI HEAD WITHOUT CONTRAST MRA NECK WITHOUT  CONTRAST TECHNIQUE: Multiplanar, multiecho pulse sequences of the brain and surrounding structures were obtained without intravenous contrast. Angiographic images of the neck were obtained using MRA technique without intravenous contrast. Carotid stenosis measurements (when applicable) are obtained utilizing NASCET criteria, using the distal internal carotid diameter as the denominator. COMPARISON:  Head CT same day FINDINGS: MRI HEAD FINDINGS Brain: Diffusion imaging shows a 1 x 1.5 cm acute infarction in the deep white matter of the right frontoparietal region, possibly serving the precentral gyrus. No large acute infarction. Elsewhere, the brainstem and cerebellum are normal. Cerebral hemispheres show moderate chronic small-vessel ischemic changes the white matter. No mass, hemorrhage, hydrocephalus or extra-axial collection. Vascular: Major vessels at the base of the brain show flow. 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 Both internal carotid arteries are patent to the bifurcation regions. Both carotid bifurcations appear normal without stenosis or irregularity. Both cervical internal carotid arteries are normal. The right vertebral artery  is a large vessel widely patent through the cervical region, through the foramen magnum to the basilar. Left vertebral artery is not visible with antegrade flow. This could be due to either occlusion or retrograde flow due to stenosis or occlusion of the left subclavian artery. IMPRESSION: Background pattern of moderate chronic small-vessel ischemic change of the white matter. 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 No swelling or hemorrhage. Noncontrast neck MR angiography was ordered and performed. I think there is a right aortic arch with mirror image branching. No carotid bifurcation disease is seen. No flow is seen in the left subclavian artery or the left vertebral artery. This could be due to occlusion or severe stenosis/retrograde flow. This would probably be better evaluated by CT angiography with contrast. Alternatively, MR angiography with contrast could be utilized. Electronically Signed   By: Nelson Chimes M.D.   On: 12/09/2018 11:29    Medications:  I have reviewed the patient's current medications. Scheduled: . [START ON 12/11/2018] aspirin EC  81 mg Oral Daily  . atorvastatin  40 mg Oral q1800  . clopidogrel  75 mg Oral Daily  . enoxaparin (LOVENOX) injection  40 mg Subcutaneous Q12H  . pantoprazole  40 mg Oral Daily    Assessment/Plan: 58 y.o. female with history of B12 deficiency presenting with LUE weakness.  MRI of the brain reviewed and shows a small right frontoparietal, acute infarct.  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  CTA  of the head and neck confirms this stenosis with no other areas of hemodynamically significant stenosis noted.  Lung lesions were seen that prompted a CT of the chest showing multiple lung lesions, high likelihood for metastasis.  Hypercoagulability from malignancy may very  well be etiology for acute infarct.  Echocardiogram shows no cardiac source of emboli with an EF of 60-65%.    Recommendations: 1. PT consult, OT consult, Speech consult 2. Prophylactic therapy-Dual antiplatelet therapy with ASA 81mg  and Plavix 75mg  for three weeks with change to ASA 81mg  alone as monotherapy after that time. 3. Statin for lipid management with target LDL<70. 4. Follow up being scheduled with [facility] as an outpatient    LOS: 1 day   Alexis Goodell, MD Neurology [phone] 12/10/2018  2:41 PM

## 2018-12-10 NOTE — Progress Notes (Signed)
Occupational Therapy Treatment Patient Details Name: Emily Soto MRN: [id number] DOB: 02-29-1960 Today's Date: 12/10/2018    History of present illness From MD H&P:  Pt is a 58 y.o. female with a known history of GERD, B12 deficiency presented with left arm and leg weakness that she noticed on waking up.  MD assessment includes: small right frontoparietal acute infarct, left subclavian artery stenosis, Hypokalemia, and HLD.   OT comments  Pt seen for OT tx this date. MD in at start of session. Pt reported feeling mildly anxious after hearing that she will need a chest CT scan. Encouragement, active listening, and emotional support provided. Pt ambulated to bathroom with supervision to CGA with no LOB and no AD required. Pt independent with toileting task. Once EOB, pt instructed in [facility] ex and activities for LUE with handout provided to support recall and carryover; pt able to return demo technique. Pt reporting continued improvement with [facility] and functional use of LUE. Continues to benefit from skilled OT services and continue to recommend OP OT.    Follow Up Recommendations  Outpatient OT    Equipment Recommendations  None recommended by OT    Recommendations for Other Services      Precautions / Restrictions Precautions Precautions: None Restrictions Weight Bearing Restrictions: No       Mobility Bed Mobility Overal bed mobility: Independent                Transfers Overall transfer level: Independent                    Balance Overall balance assessment: Mild deficits observed, not formally tested                                         ADL either performed or assessed with clinical judgement   ADL Overall ADL's : Needs assistance/impaired Eating/Feeding: Modified independent   Grooming: Modified independent   Upper Body Bathing: Modified independent   Lower Body Bathing: Sit to/from stand;Supervison/ safety;Set up   Upper Body  Dressing : Sitting;Modified independent   Lower Body Dressing: Sitting/lateral leans;Supervision/safety;Set up   Toilet Transfer: Ambulation;Regular Toilet;Min guard   Toileting- Clothing Manipulation and Hygiene: Modified independent       Functional mobility during ADLs: Min guard       Vision Baseline Vision/History: Wears glasses Wears Glasses: At all times Patient Visual Report: No change from baseline     Perception     Praxis      Cognition Arousal/Alertness: Awake/alert Behavior During Therapy: WFL for tasks assessed/performed Overall Cognitive Status: Within Functional Limits for tasks assessed                                          Exercises Other Exercises Other Exercises: Pt instructed in [facility] ex and activities for LUE with handout provided to support recall and carryover; pt able to return demo technique   Shoulder Instructions       General Comments      Pertinent Vitals/ Pain       Pain Assessment: No/denies pain  Home Living  Prior Functioning/Environment              Frequency  Min 1X/week        Progress Toward Goals  OT Goals(current goals can now be found in the care plan section)  Progress towards OT goals: Progressing toward goals  Acute Rehab OT Goals Patient Stated Goal: "I want my left arm and leg to get back to normal" OT Goal Formulation: With patient/family Time For Goal Achievement: 12/23/18 Potential to Achieve Goals: Good  Plan Discharge plan remains appropriate;Frequency remains appropriate    Co-evaluation                 AM-PAC OT "6 Clicks" Daily Activity     Outcome Measure   Help from another person eating meals?: None Help from another person taking care of personal grooming?: None Help from another person toileting, which includes using toliet, bedpan, or urinal?: None Help from another person bathing (including  washing, rinsing, drying)?: None Help from another person to put on and taking off regular upper body clothing?: None Help from another person to put on and taking off regular lower body clothing?: None 6 Click Score: 24    End of Session Equipment Utilized During Treatment: Gait belt  OT Visit Diagnosis: Other abnormalities of gait and mobility (R26.89);Hemiplegia and hemiparesis Hemiplegia - Right/Left: Left Hemiplegia - dominant/non-dominant: Non-Dominant Hemiplegia - caused by: Cerebral infarction   Activity Tolerance Patient tolerated treatment well   Patient Left in bed;with call bell/phone within reach   Nurse Communication          Time: [id number] OT Time Calculation (min): 26 min  Charges: OT General Charges $OT Visit: 1 Visit OT Treatments $Neuromuscular Re-education: 23-37 mins  Jeni Salles, MPH, MS, OTR/L ascom [phone] 12/10/18, 11:10 AM

## 2018-12-10 NOTE — Discharge Summary (Signed)
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 B12 deficiency   . Endometrial cancer ([place])   . GERD (gastroesophageal reflux disease)     HOSPITAL COURSE:  Emily Soto  is a 58 y.o. female with a known history of GERD, B12 deficiency presented with left arm and leg weakness that she noticed on waking up at 5.30 AM.  * Acute right frontalpareital CVA -per neurology recommendation patient will continue aspirin and Plavix for three weeks thereafter she will stop aspirin and continue Plavix. - ECHO overall looks normal. No acute abnormality -MRA neck with nothing acute PT/OT/Speech eval appreciated. Neuro consulted with Dr. Doy Mince appreciated  *Left subclavian vein occlusion?  Findings concerning on MRA of the neck.   -patient CT scan and MRI was reviewed by vascular surgery. She has an abnormal anatomy of her subclavian blood vessel. Nothing emergent needs to be done at this point. She will follow-up with vascular surgery as outpatient.  *Bilateral pulmonary lung masses noted on CT of the chest. Patient has history of endometrial carcinoma follows with Dr. Mellody Drown at [facility] -patient wishes to follow-up with [facility]. Appointment made for October 30 at 2:30 PM. -CT scan results were discussed with patient  and her husband.   *Hypokalemia.  Replace orally.  *Hyperlipidemia.  LDL 160.  Start Lipitor.  *DVT prophylaxis with Lovenox  Overall hemodynamically stable  discharge home CONSULTS OBTAINED:  Treatment Team:  Catarina Hartshorn, MD Sindy Guadeloupe, MD  DRUG ALLERGIES:   Allergies  Allergen Reactions  . Penicillins Hives  . Prednisone Other (See Comments)    Causes tingling to face and increased heart rate  . Avelox [Moxifloxacin Hcl] Palpitations  . Epinephrine Palpitations  . Morphine And Related Palpitations    DISCHARGE MEDICATIONS:   Allergies as of 12/10/2018      Reactions   Penicillins Hives   Prednisone Other (See Comments)   Causes tingling to face and increased heart rate   Avelox [moxifloxacin Hcl] Palpitations   Epinephrine Palpitations   Morphine And Related Palpitations      Medication List    TAKE these medications   ALPRAZolam 0.25 MG tablet Commonly known as: XANAX Take 1 tablet (0.25 mg total) by mouth 2 (two) times daily as needed for anxiety.   aspirin 81 MG EC tablet Take 1 tablet (81 mg total) by mouth daily. Stop after 3 weeks Start taking on: December 11, 2018   atorvastatin 40 MG tablet Commonly known as: LIPITOR Take 1 tablet (40 mg total) by mouth daily at 6 PM.   clopidogrel 75 MG tablet Commonly known as: PLAVIX Take 1 tablet (75 mg total) by mouth daily.   esomeprazole 40 MG capsule Commonly known as: NEXIUM Take 40 mg by mouth daily at 12 noon.   VITAMIN D-VITAMIN K PO Take 2,000 Units by mouth daily.  If you experience worsening of your admission symptoms, develop shortness of breath, life threatening emergency, suicidal or homicidal thoughts you must seek medical attention immediately by calling 911 or calling your MD immediately  if symptoms less severe.  You Must read complete instructions/literature along with all the possible adverse reactions/side effects for all the Medicines you take and that have been  prescribed to you. Take any new Medicines after you have completely understood and accept all the possible adverse reactions/side effects.   Please note  You were cared for by a hospitalist during your hospital stay. If you have any questions about your discharge medications or the care you received while you were in the hospital after you are discharged, you can call the unit and asked to speak with the hospitalist on call if the hospitalist that took care of you is not available. Once you are discharged, your primary care physician will handle any further medical issues. Please note that NO REFILLS for any discharge medications will be authorized once you are discharged, as it is imperative that you return to your primary care physician (or establish a relationship with a primary care physician if you do not have one) for your aftercare needs so that they can reassess your need for medications and monitor your lab values. Today   SUBJECTIVE   Anxious about her CT scan result  VITAL SIGNS:  Blood pressure 130/74, pulse 85, temperature 98.5 F (36.9 C), temperature source Oral, resp. rate 17, height 5\' 7"  (1.702 m), weight 120.7 kg, SpO2 100 %.  I/O:    Intake/Output Summary (Last 24 hours) at 12/10/2018 1427 Last data filed at 12/09/2018 1700 Gross per 24 hour  Intake 240 ml  Output -  Net 240 ml    PHYSICAL EXAMINATION:  GENERAL:  58 y.o.-year-old patient lying in the bed with no acute distress. obese EYES: Pupils equal, round, reactive to light and accommodation. No scleral icterus. Extraocular muscles intact.  HEENT: Head atraumatic, normocephalic. Oropharynx and nasopharynx clear.  NECK:  Supple, no jugular venous distention. No thyroid enlargement, no tenderness.  LUNGS: Normal breath sounds bilaterally, no wheezing, rales,rhonchi or crepitation. No use of accessory muscles of respiration.  CARDIOVASCULAR: S1, S2 normal. No murmurs, rubs, or gallops.  ABDOMEN: Soft, non-tender,  non-distended. Bowel sounds present. No organomegaly or mass.  EXTREMITIES: No pedal edema, cyanosis, or clubbing.  NEUROLOGIC: Cranial nerves II through XII are intact. Muscle strength 5/5 in all extremities. Sensation intact. Gait not checked. No motor deficits PSYCHIATRIC: The patient is alert and oriented x 3.  SKIN: No obvious rash, lesion, or ulcer.   DATA REVIEW:   CBC  Recent Labs  Lab 12/09/18 0625  WBC 8.9  HGB 14.3  HCT 43.4  PLT 211    Chemistries  Recent Labs  Lab 12/09/18 0625  NA 142  K 3.2*  CL 107  CO2 24  GLUCOSE 131*  BUN 16  CREATININE 0.87  CALCIUM 9.0  AST 19  ALT 10  ALKPHOS 71  BILITOT 0.6    Microbiology Results   Recent Results (from the past 240 hour(s))  SARS CORONAVIRUS 2 (TAT 6-24 HRS) Nasopharyngeal Nasopharyngeal Swab     Status: None   Collection Time: 12/09/18  7:48 AM   Specimen: Nasopharyngeal Swab  Result Value Ref Range Status   SARS Coronavirus 2 NEGATIVE NEGATIVE Final    Comment: (NOTE) SARS-CoV-2 target nucleic acids are NOT DETECTED. The SARS-CoV-2 RNA is generally detectable in upper and lower respiratory specimens  during the acute phase of infection. Negative results do not preclude SARS-CoV-2 infection, do not rule out co-infections with other pathogens, and should not be used as the sole basis for treatment or other patient management decisions. Negative results must be combined with clinical observations, patient history, and epidemiological information. The expected result is Negative. Fact Sheet for Patients: SugarRoll.be Fact Sheet for Healthcare Providers: https://www.woods-mathews.com/ This test is not yet approved or cleared by the [place] FDA and  has been authorized for detection and/or diagnosis of SARS-CoV-2 by FDA under an Emergency Use Authorization (EUA). This EUA will remain  in effect (meaning this test can be used) for the duration of the COVID-19  declaration under Section 56 4(b)(1) of the Act, 21 U.S.C. section 360bbb-3(b)(1), unless the authorization is terminated or revoked sooner. Performed at [facility], [place] [address], [place], [place] [address]     RADIOLOGY:  Ct Angio Head W Or Wo Contrast  Addendum Date: 12/09/2018   ADDENDUM REPORT: 12/09/2018 18:14 ADDENDUM: These results were called by telephone at the time of interpretation on 12/09/2018 at 6:13 pm to provider Dr. Mindi Slicker, who verbally acknowledged these results. Electronically Signed   By: Kellie Simmering DO   On: 12/09/2018 18:14   Result Date: 12/09/2018 CLINICAL DATA:  Stroke, follow-up EXAM: CT ANGIOGRAPHY HEAD AND NECK TECHNIQUE: Multidetector CT imaging of the head and neck was performed using the standard protocol during bolus administration of intravenous contrast. Multiplanar CT image reconstructions and MIPs were obtained to evaluate the vascular anatomy. Carotid stenosis measurements (when applicable) are obtained utilizing NASCET criteria, using the distal internal carotid diameter as the denominator. CONTRAST:  41mL OMNIPAQUE IOHEXOL 350 MG/ML SOLN COMPARISON:  Noncontrast head CT 12/09/2018, MRI and MRA neck 12/09/2018 FINDINGS: CT HEAD FINDINGS An acute right frontoparietal white matter infarct was better appreciated on brain MRI performed earlier the same day. No new acute demarcated cortical infarction is identified. No evidence of intracranial hemorrhage. No midline shift or extra-axial fluid collection. Redemonstrated chronic small vessel ischemic disease. Vascular: Reported separately Skull: Normal. Negative for fracture or focal lesion. Sinuses: No significant paranasal sinus disease or mastoid effusion. Orbits: Visualized orbits demonstrate no acute abnormality. Review of the MIP images confirms the above findings CTA NECK FINDINGS Aortic arch: A right aortic arch is present. Streak artifact from a dense contrast bolus limits assessment of aortic  branching. There appears to be a right subclavian artery which gives rise to the right vertebral. There may be a common origin of the common carotid arteries. The proximal left subclavian artery is poorly delineated and may be occluded. There are collateral vessels extending from the distal left subclavian artery inferiorly within the medial left thorax. The left vertebral artery arises from the left subclavian artery. Right carotid system: CCA and ICA widely patent within the neck without stenosis. No significant atherosclerotic disease. Left carotid system: Streak artifact obscures the origin of the left common carotid artery. Otherwise, the CCA and ICA are widely patent within the neck without stenosis Vertebral arteries: Right vertebral artery dominant. The vertebral arteries are patent within the neck bilaterally without stenosis. Skeleton: No acute bony abnormality. Cervical spondylosis greatest at C4-C5 and C5-C6 where there is moderate/severe disc degeneration and small posterior disc osteophytes. Other neck: No definite cervical lymphadenopathy. Several small calcifications Upper chest: 1.3 cm left upper lobe nodule (series 10, image 72). Additionally, there is an incompletely imaged left upper lobe mass (series 10, images 32-36). 9 mm right thyroid lobe nodule  with peripheral calcifications. Review of the MIP images confirms the above findings CTA HEAD FINDINGS Anterior circulation: Mild mixed plaque within the intracranial internal carotid arteries without stenosis. The bilateral middle and anterior cerebral arteries are patent without significant proximal stenosis. No intracranial aneurysm is identified. Posterior circulation: The intracranial vertebral arteries are patent without significant stenosis. Mild atherosclerotic irregularity of the basilar artery without significant stenosis. Predominantly fetal origin of the bilateral posterior cerebral arteries. The posterior cerebral arteries are patent  bilaterally without significant stenosis. Venous sinuses: Within limitations of contrast timing, no convincing thrombus. Dominant right transverse and sigmoid dural venous sinuses. Anatomic variants: As described Review of the MIP images confirms the above findings IMPRESSION: CT head: 1. An acute right frontoparietal white matter infarct was better appreciated on brain MRI performed earlier the same day. 2. Chronic small vessel ischemic disease. CTA head: 1. No intracranial large vessel occlusion or high-grade proximal arterial stenosis. Mild intracranial atherosclerotic disease. 2. Retrograde flow within the non dominant left vertebral artery as detailed under the CTA neck impression section. CTA neck: 1. Right-sided aortic arch. Streak artifact from a dense contrast bolus limits assessment of aortic branching. Suspected aortic branching as described. 2. The proximal left subclavian artery is poorly delineated and may be occluded. Collateral vessels extend from the distal left subclavian artery inferiorly within the medial left thorax. 3. The bilateral vertebral arteries are patent within the neck without stenosis. Given findings on same-day MRA neck, there is retrograde flow within the non dominant left vertebral artery (which arises from the left subclavian artery). 4. Streak artifact limits evaluation of the origin of the left common carotid artery. Otherwise, the common carotid and internal carotid arteries are patent within the neck bilaterally without stenosis. 5. 1.3 cm left upper lobe nodule. Additional incompletely imaged left upper lobe mass. Dedicated chest CT is recommended for further evaluation. Electronically Signed: By: Kellie Simmering DO On: 12/09/2018 17:58   Ct Head Wo Contrast  Result Date: 12/09/2018 CLINICAL DATA:  New onset extremity numbness and weakness during the night. EXAM: CT HEAD WITHOUT CONTRAST TECHNIQUE: Contiguous axial images were obtained from the base of the skull through the  vertex without intravenous contrast. COMPARISON:  None. FINDINGS: Brain: No evidence of acute infarction, hemorrhage, hydrocephalus, extra-axial collection or mass lesion/mass effect. Mild patchy low-density in the cerebral white matter. No specific demyelinating pattern. Normal brain volume. Vascular: No hyperdense vessel or unexpected calcification. Skull: Normal. Negative for fracture or focal lesion. Sinuses/Orbits: No acute finding. IMPRESSION: 1. No acute finding. 2. Mild cerebral white matter disease without specific pattern. Electronically Signed   By: Monte Fantasia M.D.   On: 12/09/2018 07:10   Ct Angio Neck W Or Wo Contrast  Addendum Date: 12/09/2018   ADDENDUM REPORT: 12/09/2018 18:14 ADDENDUM: These results were called by telephone at the time of interpretation on 12/09/2018 at 6:13 pm to provider Dr. Mindi Slicker, who verbally acknowledged these results. Electronically Signed   By: Kellie Simmering DO   On: 12/09/2018 18:14   Result Date: 12/09/2018 CLINICAL DATA:  Stroke, follow-up EXAM: CT ANGIOGRAPHY HEAD AND NECK TECHNIQUE: Multidetector CT imaging of the head and neck was performed using the standard protocol during bolus administration of intravenous contrast. Multiplanar CT image reconstructions and MIPs were obtained to evaluate the vascular anatomy. Carotid stenosis measurements (when applicable) are obtained utilizing NASCET criteria, using the distal internal carotid diameter as the denominator. CONTRAST:  62mL OMNIPAQUE IOHEXOL 350 MG/ML SOLN COMPARISON:  Noncontrast head CT 12/09/2018, MRI and  MRA neck 12/09/2018 FINDINGS: CT HEAD FINDINGS An acute right frontoparietal white matter infarct was better appreciated on brain MRI performed earlier the same day. No new acute demarcated cortical infarction is identified. No evidence of intracranial hemorrhage. No midline shift or extra-axial fluid collection. Redemonstrated chronic small vessel ischemic disease. Vascular: Reported separately  Skull: Normal. Negative for fracture or focal lesion. Sinuses: No significant paranasal sinus disease or mastoid effusion. Orbits: Visualized orbits demonstrate no acute abnormality. Review of the MIP images confirms the above findings CTA NECK FINDINGS Aortic arch: A right aortic arch is present. Streak artifact from a dense contrast bolus limits assessment of aortic branching. There appears to be a right subclavian artery which gives rise to the right vertebral. There may be a common origin of the common carotid arteries. The proximal left subclavian artery is poorly delineated and may be occluded. There are collateral vessels extending from the distal left subclavian artery inferiorly within the medial left thorax. The left vertebral artery arises from the left subclavian artery. Right carotid system: CCA and ICA widely patent within the neck without stenosis. No significant atherosclerotic disease. Left carotid system: Streak artifact obscures the origin of the left common carotid artery. Otherwise, the CCA and ICA are widely patent within the neck without stenosis Vertebral arteries: Right vertebral artery dominant. The vertebral arteries are patent within the neck bilaterally without stenosis. Skeleton: No acute bony abnormality. Cervical spondylosis greatest at C4-C5 and C5-C6 where there is moderate/severe disc degeneration and small posterior disc osteophytes. Other neck: No definite cervical lymphadenopathy. Several small calcifications Upper chest: 1.3 cm left upper lobe nodule (series 10, image 72). Additionally, there is an incompletely imaged left upper lobe mass (series 10, images 32-36). 9 mm right thyroid lobe nodule with peripheral calcifications. Review of the MIP images confirms the above findings CTA HEAD FINDINGS Anterior circulation: Mild mixed plaque within the intracranial internal carotid arteries without stenosis. The bilateral middle and anterior cerebral arteries are patent without  significant proximal stenosis. No intracranial aneurysm is identified. Posterior circulation: The intracranial vertebral arteries are patent without significant stenosis. Mild atherosclerotic irregularity of the basilar artery without significant stenosis. Predominantly fetal origin of the bilateral posterior cerebral arteries. The posterior cerebral arteries are patent bilaterally without significant stenosis. Venous sinuses: Within limitations of contrast timing, no convincing thrombus. Dominant right transverse and sigmoid dural venous sinuses. Anatomic variants: As described Review of the MIP images confirms the above findings IMPRESSION: CT head: 1. An acute right frontoparietal white matter infarct was better appreciated on brain MRI performed earlier the same day. 2. Chronic small vessel ischemic disease. CTA head: 1. No intracranial large vessel occlusion or high-grade proximal arterial stenosis. Mild intracranial atherosclerotic disease. 2. Retrograde flow within the non dominant left vertebral artery as detailed under the CTA neck impression section. CTA neck: 1. Right-sided aortic arch. Streak artifact from a dense contrast bolus limits assessment of aortic branching. Suspected aortic branching as described. 2. The proximal left subclavian artery is poorly delineated and may be occluded. Collateral vessels extend from the distal left subclavian artery inferiorly within the medial left thorax. 3. The bilateral vertebral arteries are patent within the neck without stenosis. Given findings on same-day MRA neck, there is retrograde flow within the non dominant left vertebral artery (which arises from the left subclavian artery). 4. Streak artifact limits evaluation of the origin of the left common carotid artery. Otherwise, the common carotid and internal carotid arteries are patent within the neck bilaterally without stenosis. 5.  1.3 cm left upper lobe nodule. Additional incompletely imaged left upper lobe  mass. Dedicated chest CT is recommended for further evaluation. Electronically Signed: By: Kellie Simmering DO On: 12/09/2018 17:58   Ct Chest Wo Contrast  Result Date: 12/10/2018 CLINICAL DATA:  Lung nodule on CTA of the neck. History of endometrial cancer. Nonsmoker. EXAM: CT CHEST WITHOUT CONTRAST TECHNIQUE: Multidetector CT imaging of the chest was performed following the standard protocol without IV contrast. 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 Aortic atherosclerosis. Normal heart size, without pericardial effusion. Mediastinum/Nodes: No supraclavicular adenopathy. No axillary adenopathy. No mediastinal or definite hilar adenopathy, given limitations of unenhanced CT. Lungs/Pleura: No pleural fluid. Pleural-based right lower lung mass, including at 4.4 x 4.3 cm on 103/3. Left apical pleural-based pulmonary nodule measures 1.1 cm on 21/3. A relatively well circumscribed, rounded left upper lobe lung mass measures 4.0 x 3.7 cm on 53/3. Upper Abdomen: Cholecystectomy. Normal imaged portions of the spleen, liver, stomach, pancreas, adrenal glands, kidneys. Musculoskeletal: Thoracic spondylosis. IMPRESSION: 1. Bilateral pulmonary lesions, including left upper and right lower lobe dominant lung masses. Given morphology and clinical history, favored to represent metastatic disease. One or more primary bronchogenic carcinomas felt less likely. 2. No thoracic adenopathy. 3. Right aortic arch. 4.  Aortic Atherosclerosis (ICD10-I70.0). Electronically Signed   By: Abigail Miyamoto M.D.   On: 12/10/2018 12:54   Mr Angio Neck Wo Contrast  Result Date: 12/09/2018 CLINICAL DATA:  Ataxia. Stroke suspected. Left-sided numbness beginning today. Negative head CT. EXAM: MRI HEAD WITHOUT CONTRAST MRA NECK WITHOUT  CONTRAST TECHNIQUE: Multiplanar, multiecho pulse  sequences of the brain and surrounding structures were obtained without intravenous contrast. Angiographic images of the neck were obtained using MRA technique without intravenous contrast. Carotid stenosis measurements (when applicable) are obtained utilizing NASCET criteria, using the distal internal carotid diameter as the denominator. COMPARISON:  Head CT same day FINDINGS: MRI HEAD FINDINGS Brain: Diffusion imaging shows a 1 x 1.5 cm acute infarction in the deep white matter of the right frontoparietal region, possibly serving the precentral gyrus. No large acute infarction. Elsewhere, the brainstem and cerebellum are normal. Cerebral hemispheres show moderate chronic small-vessel ischemic changes the white matter. No mass, hemorrhage, hydrocephalus or extra-axial collection. Vascular: Major vessels at the base of the brain show flow. 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 Both internal carotid arteries are patent to the bifurcation regions. Both carotid bifurcations appear normal without stenosis or irregularity. Both cervical internal carotid arteries are normal. The right vertebral artery is a large vessel widely patent through the cervical region, through the foramen magnum to the basilar. Left vertebral artery is not visible with antegrade flow. This could be due to either occlusion or retrograde flow due to stenosis or occlusion of the left subclavian artery. IMPRESSION: Background pattern of moderate chronic small-vessel ischemic change of the white matter. 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 No swelling or hemorrhage. Noncontrast neck MR angiography was ordered and performed. I think there is a right aortic arch with mirror image branching. No carotid bifurcation disease is  seen. No flow is seen in the left subclavian artery or the left vertebral artery. This could be due to occlusion or severe  stenosis/retrograde flow. This would probably be better evaluated by CT angiography with contrast. Alternatively, MR angiography with contrast could be utilized. Electronically Signed   By: Nelson Chimes M.D.   On: 12/09/2018 11:29   Mr Brain Wo Contrast  Result Date: 12/09/2018 CLINICAL DATA:  Ataxia. Stroke suspected. Left-sided numbness beginning today. Negative head CT. EXAM: MRI HEAD WITHOUT CONTRAST MRA NECK WITHOUT  CONTRAST TECHNIQUE: Multiplanar, multiecho pulse sequences of the brain and surrounding structures were obtained without intravenous contrast. Angiographic images of the neck were obtained using MRA technique without intravenous contrast. Carotid stenosis measurements (when applicable) are obtained utilizing NASCET criteria, using the distal internal carotid diameter as the denominator. COMPARISON:  Head CT same day FINDINGS: MRI HEAD FINDINGS Brain: Diffusion imaging shows a 1 x 1.5 cm acute infarction in the deep white matter of the right frontoparietal region, possibly serving the precentral gyrus. No large acute infarction. Elsewhere, the brainstem and cerebellum are normal. Cerebral hemispheres show moderate chronic small-vessel ischemic changes the white matter. No mass, hemorrhage, hydrocephalus or extra-axial collection. Vascular: Major vessels at the base of the brain show flow. 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 Both internal carotid arteries are patent to the bifurcation regions. Both carotid bifurcations appear normal without stenosis or irregularity. Both cervical internal carotid arteries are normal. The right vertebral artery is a large vessel widely patent through the cervical region, through the foramen magnum to the basilar. Left  vertebral artery is not visible with antegrade flow. This could be due to either occlusion or retrograde flow due to stenosis or occlusion of the left subclavian artery. IMPRESSION: Background pattern of moderate chronic small-vessel ischemic change of the white matter. 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 No swelling or hemorrhage. Noncontrast neck MR angiography was ordered and performed. I think there is a right aortic arch with mirror image branching. No carotid bifurcation disease is seen. No flow is seen in the left subclavian artery or the left vertebral artery. This could be due to occlusion or severe stenosis/retrograde flow. This would probably be better evaluated by CT angiography with contrast. Alternatively, MR angiography with contrast could be utilized. Electronically Signed   By: Nelson Chimes M.D.   On: 12/09/2018 11:29     CODE STATUS:     Code Status Orders  (From admission, onward)         Start     Ordered   12/09/18 0743  Full code  Continuous     12/09/18 0745        Code Status History    This patient has a current code status but no historical code status.   Advance Care Planning Activity      TOTAL TIME TAKING CARE OF THIS PATIENT: *40* minutes.    Fritzi Mandes M.D on 12/10/2018 at 2:27 PM  Between 7am to 6pm - Pager - [phone] After 6pm go to [email or web address] - password [email or web address] [facility]  Office  [phone]  CC: Primary care physician; Rusty Aus, MD

## 2018-12-11 ENCOUNTER — Other Ambulatory Visit: Payer: Self-pay | Admitting: Internal Medicine

## 2018-12-11 ENCOUNTER — Ambulatory Visit
Admission: RE | Admit: 2018-12-11 | Discharge: 2018-12-11 | Disposition: A | Discharge status: Home / Self Care | Payer: BC Managed Care – PPO | Source: Ambulatory Visit | Attending: Internal Medicine | Admitting: Internal Medicine

## 2018-12-11 ENCOUNTER — Other Ambulatory Visit: Payer: Self-pay

## 2018-12-11 DIAGNOSIS — R918 Other nonspecific abnormal finding of lung field: Secondary | ICD-10-CM | POA: Diagnosis not present

## 2018-12-11 DIAGNOSIS — C786 Secondary malignant neoplasm of retroperitoneum and peritoneum: Secondary | ICD-10-CM | POA: Insufficient documentation

## 2018-12-11 DIAGNOSIS — R1084 Generalized abdominal pain: Secondary | ICD-10-CM | POA: Diagnosis present

## 2018-12-11 DIAGNOSIS — I679 Cerebrovascular disease, unspecified: Secondary | ICD-10-CM | POA: Insufficient documentation

## 2018-12-11 LAB — POCT I-STAT CREATININE: Creatinine, Ser: 0.9 mg/dL (ref 0.44–1.00)

## 2018-12-11 MED ORDER — IOHEXOL 350 MG/ML SOLN
100.0000 mL | Freq: Once | INTRAVENOUS | Status: AC | PRN
  Administered 2018-12-11: 100 mL via INTRAVENOUS

## 2019-01-17 ENCOUNTER — Other Ambulatory Visit: Payer: Self-pay

## 2019-01-17 DIAGNOSIS — C541 Malignant neoplasm of endometrium: Secondary | ICD-10-CM

## 2019-01-17 DIAGNOSIS — C786 Secondary malignant neoplasm of retroperitoneum and peritoneum: Secondary | ICD-10-CM

## 2019-01-17 DIAGNOSIS — I679 Cerebrovascular disease, unspecified: Secondary | ICD-10-CM

## 2019-01-17 NOTE — Progress Notes (Signed)
Emily Soto has a  recent right frontoparietal infarct. She is undergoing biopsy today for possible recurrent endometrial cancer. She is being followed by Dr. Fransisca Connors and has seen [facility]. She is being referred to Hematology to manage anticoagulation.   Note from Dr. Sabra Heck 12-4 regarding medication.  Patient post CVA from hypercoagulable state from malignancy, and endometrial   Neurologist suggested Eliquis, higher risk of bleeding with endometrial CA with Eliquis and after discussing with patient will use 2.5 mg twice daily with baby aspirin daily, start 3 days post biopsy

## 2019-06-17 ENCOUNTER — Ambulatory Visit: Payer: BC Managed Care – PPO | Admitting: Physical Therapy

## 2019-06-19 ENCOUNTER — Ambulatory Visit: Payer: BC Managed Care – PPO | Admitting: Physical Therapy

## 2019-06-24 ENCOUNTER — Ambulatory Visit: Payer: BC Managed Care – PPO

## 2019-06-26 ENCOUNTER — Encounter: Payer: BC Managed Care – PPO | Admitting: Physical Therapy

## 2019-06-30 ENCOUNTER — Ambulatory Visit: Payer: BC Managed Care – PPO | Admitting: Physical Therapy

## 2019-06-30 ENCOUNTER — Encounter: Payer: BC Managed Care – PPO | Admitting: Physical Therapy

## 2019-07-02 ENCOUNTER — Ambulatory Visit: Payer: BC Managed Care – PPO | Admitting: Physical Therapy

## 2019-07-08 ENCOUNTER — Ambulatory Visit: Payer: BC Managed Care – PPO | Admitting: Physical Therapy

## 2019-07-10 ENCOUNTER — Ambulatory Visit: Payer: BC Managed Care – PPO | Admitting: Physical Therapy

## 2019-07-16 ENCOUNTER — Ambulatory Visit: Payer: BC Managed Care – PPO | Admitting: Physical Therapy

## 2019-07-29 ENCOUNTER — Encounter: Payer: BC Managed Care – PPO | Admitting: Physical Therapy

## 2019-07-31 ENCOUNTER — Encounter: Payer: BC Managed Care – PPO | Admitting: Physical Therapy

## 2019-08-05 ENCOUNTER — Encounter: Payer: BC Managed Care – PPO | Admitting: Physical Therapy

## 2019-08-07 ENCOUNTER — Encounter: Payer: BC Managed Care – PPO | Admitting: Physical Therapy

## 2019-08-12 ENCOUNTER — Encounter: Payer: BC Managed Care – PPO | Admitting: Physical Therapy

## 2019-08-13 ENCOUNTER — Ambulatory Visit: Payer: BC Managed Care – PPO | Admitting: Physical Therapy

## 2019-08-13 ENCOUNTER — Ambulatory Visit: Payer: BC Managed Care – PPO | Attending: Physician Assistant | Admitting: Physical Therapy

## 2019-08-13 ENCOUNTER — Other Ambulatory Visit: Payer: Self-pay

## 2019-08-13 DIAGNOSIS — M6281 Muscle weakness (generalized): Secondary | ICD-10-CM | POA: Insufficient documentation

## 2019-08-13 NOTE — Patient Instructions (Signed)
Hip Adduction: Leg Lift (Eccentric) - Side-Lying    Lie on side with top leg bent, foot flat behind lower leg. Quickly lift lower leg. Slowly lower for 3-5 seconds. _5__ reps per set, __2_ sets per day, __7_ days per week.   http://ecce.exer.us/75   Copyright  VHI. All rights reserved.  Bridge    Lie back, legs bent. Inhale, pressing hips up. Keeping ribs in, lengthen lower back. Exhale, rolling down along spine from top. Repeat _10___ times. Do __2__ sessions per day.  http://pm.exer.us/55   Copyright  VHI. All rights reserved.  Hip Extension: Standing (Single Leg)    Hold onto counter and extend one leg back with knee straight 10  Repetitions x 2 sets , 7 days / week  http://tub.exer.us/194   Copyright  VHI. All rights reserved.  HIP: Abduction - Standing (Band)    Place band around legs. . Raise leg out and slightly back. . Use __theraband______ band. __10_ reps per set, __2_ sets per day, _7__ days per week Hold onto a support.  Copyright  VHI. All rights reserved.  Heel Raises    Stand with support. Tighten pelvic floor and hold. With knees straight, raise heels off ground. Hold ___2 seconds. Repeat _20__ times. Do _2__ times a day.  Copyright  VHI. All rights reserved.

## 2019-08-13 NOTE — Therapy (Signed)
[place] MAIN [facility] SERVICES [address] [place], [place], [address] Phone: [phone]   Fax:  [phone]  Physical Therapy Evaluation  Patient Details  Name: Emily Soto MRN: [id number] Date of Birth: [birth year] Referring Provider (PT): nolte, Alinda Dooms   Encounter Date: 08/13/2019   PT End of Session - 08/13/19 1629    Visit Number 1    Number of Visits 17    Date for PT Re-Evaluation 10/08/19    PT Start Time 0415    PT Stop Time 0515    PT Time Calculation (min) 60 min    Equipment Utilized During Treatment Gait belt    Activity Tolerance Patient tolerated treatment well    Behavior During Therapy [facility] for tasks assessed/performed           Past Medical History:  Diagnosis Date  . B12 deficiency   . Endometrial cancer ([place])   . GERD (gastroesophageal reflux disease)     No past surgical history on file.  There were no vitals filed for this visit.    Subjective Assessment - 08/13/19 1619    Pertinent History She was found to have a small CVA 12/09/18.  She was having LUE motor control difficulty that got better. 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 She has had chemo dec 28- April 23. She has had no PT for this epsode.    How long can you stand comfortably? 20 minute walk    How long can you walk comfortably? 15 mintues    Patient Stated Goals to get stronger    Currently in Pain? No/denies    Multiple Pain Sites No              [facility] PT Assessment - 08/13/19 1622      Assessment   Medical Diagnosis lung cancer    Referring Provider (PT) nolte, Alinda Dooms    Onset Date/Surgical Date 12/09/18    Prior Therapy no      Precautions   Precautions None      Restrictions   Weight Bearing Restrictions No      Balance Screen   Has the patient fallen in the past 6 months No    Has the patient had a decrease in activity level because of a fear of falling?  Yes    Is the patient reluctant to leave  their home because of a fear of falling?  No      Home Ecologist residence    Living Arrangements Spouse/significant other    Available Help at Discharge Family    Type of [place] to enter    Entrance Stairs-Number of Steps 6    Entrance Stairs-Rails Right    Home Equipment Grab bars - tub/shower      Prior Function   Level of Independence Independent    Vocation Full time employment    Vocation Requirements sitting, walking,     Leisure play with grandchildren      Cognition   Overall Cognitive Status Within Functional Limits for tasks assessed             PAIN: no pain  POSTURE: WNL   PROM/AROM: PROM BUE:WNL AROM BUEWNL PROM BLEWNL AROM BLE:WNL  STRENGTH:  Graded on a 0-5 scale Muscle Group Left Right  Hip Flex 3+/5 3+/5  Hip Abd 3/5 4/5  Hip Add 2/5 2+/5  Hip Ext 3/5 3/5      Knee Flex 5/5 5/5  Knee Ext 5/5 5/5  Ankle DF 5/5 5/5  Ankle PF 3+/5 3+/5   SENSATION:  BUE : WNL BLE : WNL  NEUROLOGICAL SCREEN: (2+ unless otherwise noted.) N=normal  Ab=abnormal   Level Dermatome R L  C3 Anterior Neck  N N  C4 Top of Shoulder N N  C5 Lateral Upper Arm  N N  C6 Lateral Arm/ Thumb  N N  C7 Middle Finger  N N  C8 4th & 5th Finger N N  T1 Medial Arm N N  L2 Medial thigh/groin N N  L3 Lower thigh/med.knee N N  L4 Medial leg/lat thigh N N  L5 Lat. leg & dorsal foot N N  S1 post/lat foot/thigh/leg N N  S2 Post./med. thigh & leg N N    SOMATOSENSORY:  Any N & T in extremities or weakness: reports :         Sensation           Intact      Diminished         Absent  Light touch LEs                              COORDINATION: BUE intact.  FUNCTIONAL MOBILITY:Independent bed mobility and transfers sit to stand   BALANCE:WNL  GAIT:Patient ambulates without AD and normal gait speed with decreased 6 MW test score  OUTCOME MEASURES: TEST Outcome Interpretation   LEFS 51/80 80/80 is WNL          6 minute walk test     1220           Feet 1000 feet is community ambulator       Treatment" Leg press 55 lbs x 20 x 3 sets  HEP: BLE Heel raises x 20 , 2 sec hold Hip abd standing with RTB x 10 x 2  Hip extension standing with RTB 10 x 2  sidelying hip add x 5 BLE Patient performed with instruction, verbal cues, tactile cues of therapist: goal: increase tissue extensibility, promote proper posture, improve mobility      Objective measurements completed on examination: See above findings.               PT Education - 08/13/19 1628    Education Details plan of care    Person(s) Educated Patient    Methods Explanation    Comprehension Verbalized understanding            PT Short Term Goals - 08/13/19 1727      PT SHORT TERM GOAL #1   Title Patient will be independent in home exercise program to improve strength/mobility for better functional independence with ADLs.    Time 4    Period Weeks    Status New    Target Date 09/10/19             PT Long Term Goals - 08/13/19 1728      PT LONG TERM GOAL #1   Title Patient will increase six minute walk test distance > 80 feet for progression to community ambulator and improve gait ability    Time 8    Period Weeks    Status New    Target Date 10/08/19      PT LONG  TERM GOAL #2   Title Patient will increase BLE gross strength to 4+/5 as to improve functional strength for independent gait, increased standing tolerance and increased ADL ability.    Time 8    Period Weeks    Status New    Target Date 10/08/19      PT LONG TERM GOAL #3   Title Patient will be able to perform household work/ chores without increase in symptoms.    Time 8    Period Weeks    Status New    Target Date 10/08/19      PT LONG TERM GOAL #4   Title Patient will increase lower extremity functional scale to >60/80 to demonstrate improved functional mobility and increased tolerance with ADLs     Baseline 08/13/19=51/80    Time 8    Period Weeks    Target Date 10/08/19                  Plan - 08/13/19 1630    Clinical Impression Statement Patient presents with decreased 6 M W  decreased BLE strength. Patient's main complaint is BLE weakness and inability to participate in desired activities.  Patient will benefit from skilled PT in order to  increase BLE strength, and improve endurance and enable patient to participate in desired activities and return to work.   Stability/Clinical Decision Making Stable/Uncomplicated    Clinical Decision Making Low    Rehab Potential Good    PT Frequency 2x / week    PT Duration 8 weeks    PT Treatment/Interventions Gait training;Therapeutic exercise;Therapeutic activities;Functional mobility training;Patient/family education;Manual techniques;Dry needling;Joint Manipulations    PT Next Visit Plan strengthenign    PT Home Exercise Plan heel raises, bridges, hip abd, hip ext, hip add    Consulted and Agree with Plan of Care Patient           Patient will benefit from skilled therapeutic intervention in order to improve the following deficits and impairments:  Abnormal gait, Difficulty walking, Decreased strength  Visit Diagnosis: Muscle weakness (generalized)     Problem List Patient Active Problem List   Diagnosis Date Noted  . Cerebrovascular disease 12/11/2018  . 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 Left hand weakness 12/09/2018  . Malignant neoplasm of endometrium ([place]) 10/28/2013    Alanson Puls, PT DPT 08/13/2019, 5:37 PM  [place] MAIN [facility] SERVICES [address] [place], [place], [address] Phone: [phone]   Fax:  [phone]  Name: Emily Soto MRN: [id number] Date of Birth: May 19, 1960

## 2019-08-14 ENCOUNTER — Encounter: Payer: BC Managed Care – PPO | Admitting: Physical Therapy

## 2019-08-19 ENCOUNTER — Ambulatory Visit: Payer: BC Managed Care – PPO | Attending: Physician Assistant

## 2019-08-19 ENCOUNTER — Other Ambulatory Visit: Payer: Self-pay

## 2019-08-19 DIAGNOSIS — M6281 Muscle weakness (generalized): Secondary | ICD-10-CM | POA: Insufficient documentation

## 2019-08-19 NOTE — Therapy (Signed)
[place] MAIN [facility] SERVICES [address] [place], [place], [address] Phone: [phone]   Fax:  [phone]  Physical Therapy Treatment  Patient Details  Name: Emily Soto MRN: [id number] Date of Birth: [birth year] Referring Provider (PT): nolte, Alinda Dooms   Encounter Date: 08/19/2019   PT End of Session - 08/19/19 1643    Visit Number 2    Number of Visits 17    Date for PT Re-Evaluation 10/08/19    Authorization Time Period 08/13/19-10/08/19    PT Start Time 6962    PT Stop Time 1718    PT Time Calculation (min) 40 min    Activity Tolerance Patient tolerated treatment well;No increased pain    Behavior During Therapy WFL for tasks assessed/performed           Past Medical History:  Diagnosis Date  . B12 deficiency   . Endometrial cancer ([place])   . GERD (gastroesophageal reflux disease)     No past surgical history on file.  There were no vitals filed for this visit.   Subjective Assessment - 08/19/19 1642    Subjective Pt doing fine today, reports pain as typical. Pt is tired from getting her windows cleaned in preparation for company.    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  She was having LUE motor control difficulty that got better. 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 She has had chemo dec 28- April 23. She has had no PT for this epsode.    Currently in Pain? No/denies          INTERVENTION THIS DATE:  -Nustep 108BPM, 95% SpO2 Level 1, Seat 8, Arms 8, 5 minutes    -STS unsupported, 2x10 (hands across chest)  -seated brace marching 2.5lb AW 2x20 alternating pattern (unsupported for core engagement)   -standing heel raises in tandem 1x20 -seated LAQ (5lb AW) 2x10  -seated band clam, ball at feet, blueTB 2x20  -quadruped hip extension 1x10 bilat (fairly fatiguing)  -paloff press @ cable machina 1x5 bilat each ditrection each stance      PT Short Term Goals - 08/13/19 1727       PT SHORT TERM GOAL #1   Title Patient will be independent in home exercise program to improve strength/mobility for better functional independence with ADLs.    Time 4    Period Weeks    Status New    Target Date 09/10/19             PT Long Term Goals - 08/13/19 1728      PT LONG TERM GOAL #1   Title Patient will increase six minute walk test distance > 80 feet for progression to community ambulator and improve gait ability    Time 8    Period Weeks    Status New    Target Date 10/08/19      PT LONG TERM GOAL #2   Title Patient will increase BLE gross strength to 4+/5 as to improve functional strength for independent gait, increased standing tolerance and increased ADL ability.    Time 8    Period Weeks    Status New    Target Date 10/08/19      PT LONG TERM GOAL #3   Title Patient will be able to perform household work/ chores without increase in symptoms.    Time 8    Period Weeks    Status New    Target  Date 10/08/19      PT LONG TERM GOAL #4   Title Patient will increase lower extremity functional scale to >60/80 to demonstrate improved functional mobility and increased tolerance with ADLs    Baseline 08/13/19=51/80    Time 8    Period Weeks    Target Date 10/08/19                 Plan - 08/19/19 1645    Clinical Impression Statement Began to establish program based on evlauation visit last session. Pt tolerates well, rest breaks given as needed, no increase in pain during session. Pt has SOB after most exercises, but no hypoxia. Pt require minimal cues for correct performance.    Stability/Clinical Decision Making Stable/Uncomplicated    Clinical Decision Making Low    Rehab Potential Good    PT Frequency 2x / week    PT Duration 8 weeks    PT Treatment/Interventions Gait training;Therapeutic exercise;Therapeutic activities;Functional mobility training;Patient/family education;Manual techniques;Dry needling;Joint Manipulations    PT Next Visit Plan  continue with LE strengthening    PT Home Exercise Plan heel raises, bridges, hip abd, hip ext, hip add    Consulted and Agree with Plan of Care Patient           Patient will benefit from skilled therapeutic intervention in order to improve the following deficits and impairments:  Abnormal gait, Difficulty walking, Decreased strength  Visit Diagnosis: Muscle weakness (generalized)     Problem List Patient Active Problem List   Diagnosis Date Noted  . Cerebrovascular disease 12/11/2018  . 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 Left hand weakness 12/09/2018  . Malignant neoplasm of endometrium ([place]) 10/28/2013   5:21 PM, 08/19/19 Etta Grandchild, PT, DPT Physical Therapist - [facility]  Outpatient Physical Therapy- [place] [phone]     Etta Grandchild 08/19/2019, 4:50 PM  [place] MAIN [facility] SERVICES [address] [place], [place], [address] Phone: [phone]   Fax:  [phone]  Name: Reigna Ruperto MRN: [id number] Date of Birth: 08-Aug-1960

## 2019-08-21 ENCOUNTER — Ambulatory Visit: Payer: BC Managed Care – PPO

## 2019-08-21 ENCOUNTER — Other Ambulatory Visit: Payer: Self-pay

## 2019-08-21 DIAGNOSIS — M6281 Muscle weakness (generalized): Secondary | ICD-10-CM

## 2019-08-21 NOTE — Therapy (Signed)
[place] MAIN [facility] SERVICES [address] [place], [place], [address] Phone: [phone]   Fax:  [phone]  Physical Therapy Treatment  Patient Details  Name: Emily Soto MRN: [id number] Date of Birth: 11/21/1960 Referring Provider (PT): nolte, Alinda Dooms   Encounter Date: 08/21/2019   PT End of Session - 08/21/19 1659    Visit Number 3    Number of Visits 17    Date for PT Re-Evaluation 10/08/19    Authorization Time Period 08/13/19-10/08/19    PT Start Time 2979    PT Stop Time 8921    PT Time Calculation (min) 40 min    Equipment Utilized During Treatment Gait belt    Activity Tolerance Patient tolerated treatment well;No increased pain    Behavior During Therapy WFL for tasks assessed/performed           Past Medical History:  Diagnosis Date   B12 deficiency    Endometrial cancer ([place])    GERD (gastroesophageal reflux disease)     No past surgical history on file.  There were no vitals filed for this visit.   Subjective Assessment - 08/21/19 1659    Subjective Pt doing well this date, had some back pain yesterday and today after last session, suspects from the bakc extension perfomred during quadruped extension.    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  She was having LUE motor control difficulty that got better. 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 She has had chemo dec 28- April 23. She has had no PT for this epsode.    Currently in Pain? No/denies          INTERVENTION THIS DATE: -NUSTEP Seat 8, Arms 8, level 1, RPM > 80 5 minutes  -NuStep HIIT: Level 5, RPM 90s, 3x30sec @ max effort, then 60 sec recovery interval at level 0  -standing heel raises in tandem 1x20 -STS from chair c orange ball 5000g at sternum  -seated brace marching 2.5lb AW 2x20 alternating pattern, chair + dynadisc (unsupported for core engagement)   -standing hip extension 1x10 bilat 2.5lb AW, pt forward  flexed 45 degrees  arms on chair armrests, heavy cues to avoid lumbar spine movement  -standing hip abduction 1x10 bilat 2.5lb AW, again difficult isolating hip joint moment from lumbar spine lateral flexion   -seated LAQ (5lb AW) 1x12       PT Short Term Goals - 08/13/19 1727      PT SHORT TERM GOAL #1   Title Patient will be independent in home exercise program to improve strength/mobility for better functional independence with ADLs.    Time 4    Period Weeks    Status New    Target Date 09/10/19             PT Long Term Goals - 08/13/19 1728      PT LONG TERM GOAL #1   Title Patient will increase six minute walk test distance > 80 feet for progression to community ambulator and improve gait ability    Time 8    Period Weeks    Status New    Target Date 10/08/19      PT LONG TERM GOAL #2   Title Patient will increase BLE gross strength to 4+/5 as to improve functional strength for independent gait, increased standing tolerance and increased ADL ability.    Time 8    Period Weeks    Status  New    Target Date 10/08/19      PT LONG TERM GOAL #3   Title Patient will be able to perform household work/ chores without increase in symptoms.    Time 8    Period Weeks    Status New    Target Date 10/08/19      PT LONG TERM GOAL #4   Title Patient will increase lower extremity functional scale to >60/80 to demonstrate improved functional mobility and increased tolerance with ADLs    Baseline 08/13/19=51/80    Time 8    Period Weeks    Target Date 10/08/19                 Plan - 08/21/19 1700    Clinical Impression Statement Continued with current plan of care, gently progressing patient's program aimed at address deficits and limitations identified in evlauation. Pt started on HIIT this date. Pt has some abdominal DOMS from last session, mild back pain from last session, both of which are monitored to avoid exacerbation. Pt continues to make steady progress  toward treatment goals in general. Author provides extensive verbal, visual, and tactile cues when needed to assure all interventions are performed with desired form and good accuracy. Extensive communicaiton to assure pt is able to perform all activities without exacerbation of pain or other symptoms.    Stability/Clinical Decision Making Stable/Uncomplicated    Clinical Decision Making Low    Rehab Potential Good    PT Frequency 2x / week    PT Duration 8 weeks    PT Treatment/Interventions Gait training;Therapeutic exercise;Therapeutic activities;Functional mobility training;Patient/family education;Manual techniques;Dry needling;Joint Manipulations    PT Next Visit Plan continue with LE strengthening    PT Home Exercise Plan heel raises, bridges, hip abd, hip ext, hip add    Consulted and Agree with Plan of Care Patient           Patient will benefit from skilled therapeutic intervention in order to improve the following deficits and impairments:  Abnormal gait, Difficulty walking, Decreased strength  Visit Diagnosis: Muscle weakness (generalized)     Problem List Patient Active Problem List   Diagnosis Date Noted   Cerebrovascular disease 12/11/2018   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 [place], [place], [address] Phone: [phone]   Fax:  [phone]  Name: Emily Soto MRN: [id number] Date of Birth: 1960/06/14

## 2019-08-25 ENCOUNTER — Encounter: Payer: Self-pay | Admitting: Physical Therapy

## 2019-08-25 ENCOUNTER — Ambulatory Visit: Payer: BC Managed Care – PPO | Admitting: Physical Therapy

## 2019-08-25 ENCOUNTER — Other Ambulatory Visit: Payer: Self-pay

## 2019-08-25 DIAGNOSIS — M6281 Muscle weakness (generalized): Secondary | ICD-10-CM | POA: Diagnosis not present

## 2019-08-25 NOTE — Therapy (Signed)
[place] MAIN [facility] SERVICES [address] [place], [place], [address] Phone: [phone]   Fax:  [phone]  Physical Therapy Treatment  Patient Details  Name: Emily Soto MRN: [id number] Date of Birth: 07-Aug-1960 Referring Provider (PT): nolte, Alinda Dooms   Encounter Date: 08/25/2019   PT End of Session - 08/25/19 1349    Visit Number 4    Number of Visits 17    Date for PT Re-Evaluation 10/08/19    Authorization Time Period 08/13/19-10/08/19    PT Start Time 0146    PT Stop Time 0225    PT Time Calculation (min) 39 min    Equipment Utilized During Treatment Gait belt    Activity Tolerance Patient tolerated treatment well;No increased pain    Behavior During Therapy WFL for tasks assessed/performed           Past Medical History:  Diagnosis Date  . B12 deficiency   . Endometrial cancer ([place])   . GERD (gastroesophageal reflux disease)     History reviewed. No pertinent surgical history.  There were no vitals filed for this visit.   Subjective Assessment - 08/25/19 1346    Subjective Pt doing well this date, No pain today.    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  She was having LUE motor control difficulty that got better. 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 She has had chemo dec 28- April 23. She has had no PT for this epsode.    How long can you stand comfortably? 20 minute walk    How long can you walk comfortably? 15 mintues    Patient Stated Goals to get stronger    Currently in Pain? No/denies    Multiple Pain Sites No           Ther-ex  Nu-step  x 5 mins  Hip flexion marches with 2# ankle weights x 10 bilateral; Hip abduction with 2# x 10 bilateral Hip extension with 2# x 10 bilateral Sit to stand without UE support from regular height chair x 10   Lunges to BOSU ball x 15 BLE Heel raises x 15 x 2 sets Step ups to 6-inch stool x 20  Eccentric step downs from 3 inch stool x  10 verbal cues to complete slow and tap heel.  BUE CGA Leg press 45 lbs x 20 x 3   Supine: SLR  With 2 intervalx 15 BLE Hookling marching x 15  Hooklying abd/ER x 15  Bridging x 15 SAQ x 15 BLE Hip abd/add x 15, BLE  Sidelying: Hip abd x 15 , BLE    Patient performed with instruction, verbal cues, tactile cues of therapist: goal: increase tissue extensibility, promote proper posture, improve mobility                  PT Education - 08/25/19 1348    Education Details HEP    Person(s) Educated Patient    Methods Explanation    Comprehension Verbalized understanding            PT Short Term Goals - 08/13/19 1727      PT SHORT TERM GOAL #1   Title Patient will be independent in home exercise program to improve strength/mobility for better functional independence with ADLs.    Time 4    Period Weeks    Status New    Target Date 09/10/19  PT Long Term Goals - 08/13/19 1728      PT LONG TERM GOAL #1   Title Patient will increase six minute walk test distance > 80 feet for progression to community ambulator and improve gait ability    Time 8    Period Weeks    Status New    Target Date 10/08/19      PT LONG TERM GOAL #2   Title Patient will increase BLE gross strength to 4+/5 as to improve functional strength for independent gait, increased standing tolerance and increased ADL ability.    Time 8    Period Weeks    Status New    Target Date 10/08/19      PT LONG TERM GOAL #3   Title Patient will be able to perform household work/ chores without increase in symptoms.    Time 8    Period Weeks    Status New    Target Date 10/08/19      PT LONG TERM GOAL #4   Title Patient will increase lower extremity functional scale to >60/80 to demonstrate improved functional mobility and increased tolerance with ADLs    Baseline 08/13/19=51/80    Time 8    Period Weeks    Target Date 10/08/19                 Plan - 08/25/19 1349     Clinical Impression Statement Pt requires direction and verbal cues for correct performance of  strengthening exercises. Patient has fatigue with endurance and difficulty with longer standing tasks.  Patient struggles with repetitions, due to weakness,  Pt encouraged continuing HEP   Patient will benefit from continued skilled PT to improve mobility and safety.   Stability/Clinical Decision Making Stable/Uncomplicated    Rehab Potential Good    PT Frequency 2x / week    PT Duration 8 weeks    PT Treatment/Interventions Gait training;Therapeutic exercise;Therapeutic activities;Functional mobility training;Patient/family education;Manual techniques;Dry needling;Joint Manipulations    PT Next Visit Plan continue with LE strengthening    PT Home Exercise Plan heel raises, bridges, hip abd, hip ext, hip add    Consulted and Agree with Plan of Care Patient           Patient will benefit from skilled therapeutic intervention in order to improve the following deficits and impairments:  Abnormal gait, Difficulty walking, Decreased strength  Visit Diagnosis: Muscle weakness (generalized)     Problem List Patient Active Problem List   Diagnosis Date Noted  . Cerebrovascular disease 12/11/2018  . 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 Left hand weakness 12/09/2018  . Malignant neoplasm of endometrium ([place]) 10/28/2013    Alanson Puls, PT DPT 08/25/2019, 1:52 PM  [place] MAIN [facility] SERVICES [address] [place], [place], [address] Phone: [phone]   Fax:  [phone]  Name: Emily Soto MRN: [id number] Date of Birth: [birth year]

## 2019-08-27 ENCOUNTER — Ambulatory Visit: Payer: BC Managed Care – PPO | Admitting: Physical Therapy

## 2019-08-27 ENCOUNTER — Encounter: Payer: Self-pay | Admitting: Physical Therapy

## 2019-08-27 ENCOUNTER — Other Ambulatory Visit: Payer: Self-pay

## 2019-08-27 DIAGNOSIS — M6281 Muscle weakness (generalized): Secondary | ICD-10-CM | POA: Diagnosis not present

## 2019-08-27 NOTE — Therapy (Signed)
[place] MAIN [facility] SERVICES [address] [place], [place], [address] Phone: [phone]   Fax:  [phone]  Physical Therapy Treatment  Patient Details  Name: Emily Soto MRN: [id number] Date of Birth: 09-19-1960 Referring Provider (PT): nolte, Alinda Dooms   Encounter Date: 08/27/2019   PT End of Session - 08/27/19 1356    Visit Number 5    Number of Visits 17    Date for PT Re-Evaluation 10/08/19    Authorization Time Period 08/13/19-10/08/19    PT Start Time 0151    PT Stop Time 0230    PT Time Calculation (min) 39 min    Equipment Utilized During Treatment Gait belt    Activity Tolerance Patient tolerated treatment well;No increased pain    Behavior During Therapy WFL for tasks assessed/performed           Past Medical History:  Diagnosis Date  . B12 deficiency   . Endometrial cancer ([place])   . GERD (gastroesophageal reflux disease)     History reviewed. No pertinent surgical history.  There were no vitals filed for this visit.   Subjective Assessment - 08/27/19 1356    Subjective Pt doing well this date, No pain today.    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  She was having LUE motor control difficulty that got better. 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 She has had chemo dec 28- April 23. She has had no PT for this epsode.    How long can you stand comfortably? 20 minute walk    How long can you walk comfortably? 15 mintues    Patient Stated Goals to get stronger    Currently in Pain? No/denies    Multiple Pain Sites No           Treatment:    Ther-ex Nu-step  x 5 mins  Hip flexion marches with 2# ankle weights x 10 bilateral Hip abduction with 2# x 10 bilateral Hip extension with 2# x 10 bilateral Lunges to BOSU ball x 15 BLE Heel raises x 15 x 2 sets Step ups to 6-inch stool x 20  Eccentric step downs from 3 inch stool x 10 verbal cues to complete slow and tap heel.  BUE CGA Leg press 40 lbs x 20 x 3      Patient performed with instruction, verbal cues, tactile cues of therapist: goal:increase tissue extensibility, promote proper posture, improve mobility                        PT Education - 08/27/19 1356    Education Details HEP    Person(s) Educated Patient    Methods Demonstration;Tactile cues;Verbal cues    Comprehension Verbal cues required;Tactile cues required;Need further instruction            PT Short Term Goals - 08/13/19 1727      PT SHORT TERM GOAL #1   Title Patient will be independent in home exercise program to improve strength/mobility for better functional independence with ADLs.    Time 4    Period Weeks    Status New    Target Date 09/10/19             PT Long Term Goals - 08/13/19 1728      PT LONG TERM GOAL #1   Title Patient will increase six minute walk test distance > 80 feet for progression to community  ambulator and improve gait ability    Time 8    Period Weeks    Status New    Target Date 10/08/19      PT LONG TERM GOAL #2   Title Patient will increase BLE gross strength to 4+/5 as to improve functional strength for independent gait, increased standing tolerance and increased ADL ability.    Time 8    Period Weeks    Status New    Target Date 10/08/19      PT LONG TERM GOAL #3   Title Patient will be able to perform household work/ chores without increase in symptoms.    Time 8    Period Weeks    Status New    Target Date 10/08/19      PT LONG TERM GOAL #4   Title Patient will increase lower extremity functional scale to >60/80 to demonstrate improved functional mobility and increased tolerance with ADLs    Baseline 08/13/19=51/80    Time 8    Period Weeks    Target Date 10/08/19                 Plan - 08/27/19 1357    Clinical Impression Statement Pt requires direction and verbal cues for correct performance of strengthening exercises. Patient  demonstrates weakness in BLE and performs open and closed chain exercises with no reports of increased pain. Pt was able to perform all exercises with min assist and VC for technique Pt encouraged continuing HEP .Follow-up as scheduled.   Stability/Clinical Decision Making Stable/Uncomplicated    Rehab Potential Good    PT Frequency 2x / week    PT Duration 8 weeks    PT Treatment/Interventions Gait training;Therapeutic exercise;Therapeutic activities;Functional mobility training;Patient/family education;Manual techniques;Dry needling;Joint Manipulations    PT Next Visit Plan continue with LE strengthening    PT Home Exercise Plan heel raises, bridges, hip abd, hip ext, hip add    Consulted and Agree with Plan of Care Patient           Patient will benefit from skilled therapeutic intervention in order to improve the following deficits and impairments:  Abnormal gait, Difficulty walking, Decreased strength  Visit Diagnosis: Muscle weakness (generalized)     Problem List Patient Active Problem List   Diagnosis Date Noted  . Cerebrovascular disease 12/11/2018  . 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 Left hand weakness 12/09/2018  . Malignant neoplasm of endometrium ([place]) 10/28/2013    Alanson Puls, PT DPT 08/27/2019, 1:58 PM  [place] MAIN [facility] SERVICES [address] [place], [place], [address] Phone: [phone]   Fax:  [phone]  Name: Emily Soto MRN: [id number] Date of Birth: 12/27/1960

## 2019-09-01 ENCOUNTER — Other Ambulatory Visit: Payer: Self-pay

## 2019-09-01 ENCOUNTER — Encounter: Payer: Self-pay | Admitting: Physical Therapy

## 2019-09-01 ENCOUNTER — Ambulatory Visit: Payer: BC Managed Care – PPO

## 2019-09-01 DIAGNOSIS — M6281 Muscle weakness (generalized): Secondary | ICD-10-CM

## 2019-09-01 NOTE — Therapy (Signed)
[place] MAIN [facility] SERVICES [address] [place], [place], [address] Phone: [phone]   Fax:  [phone]  Physical Therapy Treatment  Patient Details  Name: Emily Soto MRN: [id number] Date of Birth: Dec 31, 1960 Referring Provider (PT): nolte, Alinda Dooms   Encounter Date: 09/01/2019   PT End of Session - 09/01/19 1355    Visit Number 6    Number of Visits 17    Date for PT Re-Evaluation 10/08/19    Authorization Time Period 08/13/19-10/08/19    PT Start Time 7902    PT Stop Time 1430    PT Time Calculation (min) 43 min    Equipment Utilized During Treatment Gait belt    Activity Tolerance Patient tolerated treatment well;No increased pain    Behavior During Therapy WFL for tasks assessed/performed           Past Medical History:  Diagnosis Date  . B12 deficiency   . Endometrial cancer ([place])   . GERD (gastroesophageal reflux disease)     History reviewed. No pertinent surgical history.  There were no vitals filed for this visit.   Subjective Assessment - 09/01/19 1350    Subjective Patient reported that she is doing well today, no complaints. Stated her cardiac MRI came back clear no significant findings, currently wearing a heart monitor until Friday.    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  She was having LUE motor control difficulty that got better. 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 She has had chemo dec 28- April 23. She has had no PT for this epsode.    How long can you stand comfortably? 20 minute walk    How long can you walk comfortably? 15 mintues    Patient Stated Goals to get stronger    Currently in Pain? No/denies            Treatment:    Ther-ex   Nu-step  x 5 mins  SPM >40, level 2 Hip flexion marches with 2# ankle weights 2 x 10 bilateral  Hip abduction with 2#  2 x 10 bilateral  Hip extension with 2#  2 x 10 bilateral  Lunges to BOSU ball x 10 BLE  Heel raises x  30  with 2#AW Step ups to 6-inch stool x 20   Eccentric step downs from 3 inch stool x 10 verbal cues to complete slow and tap heel.  BUE CGA  Leg press 40 lbs x 20 x 3      Pt response/clinical impression: The patient exhibited excellent motivation during session, able to double the sets of exercises today. Some fatigue noted, and seated rest breaks needed but pt able to complete all interventions. The patient would benefit from further skilled PT intervention to continue to progress towards goals.      PT Education - 09/01/19 1353    Education Details therex form/technique    Person(s) Educated Patient    Methods Explanation;Demonstration;Tactile cues    Comprehension Verbalized understanding;Returned demonstration;Verbal cues required;Need further instruction            PT Short Term Goals - 08/13/19 1727      PT SHORT TERM GOAL #1   Title Patient will be independent in home exercise program to improve strength/mobility for better functional independence with ADLs.    Time 4    Period Weeks    Status New    Target Date 09/10/19  PT Long Term Goals - 08/13/19 1728      PT LONG TERM GOAL #1   Title Patient will increase six minute walk test distance > 80 feet for progression to community ambulator and improve gait ability    Time 8    Period Weeks    Status New    Target Date 10/08/19      PT LONG TERM GOAL #2   Title Patient will increase BLE gross strength to 4+/5 as to improve functional strength for independent gait, increased standing tolerance and increased ADL ability.    Time 8    Period Weeks    Status New    Target Date 10/08/19      PT LONG TERM GOAL #3   Title Patient will be able to perform household work/ chores without increase in symptoms.    Time 8    Period Weeks    Status New    Target Date 10/08/19      PT LONG TERM GOAL #4   Title Patient will increase lower extremity functional scale to >60/80 to demonstrate improved  functional mobility and increased tolerance with ADLs    Baseline 08/13/19=51/80    Time 8    Period Weeks    Target Date 10/08/19                 Plan - 09/01/19 1354    Clinical Impression Statement The patient exhibited excellent motivation during session, able to double the sets of exercises today. Some fatigue noted, and seated rest breaks needed but pt able to complete all interventions. The patient would benefit from further skilled PT intervention to continue to progress towards goals.    Stability/Clinical Decision Making Stable/Uncomplicated    Rehab Potential Good    PT Frequency 2x / week    PT Duration 8 weeks    PT Treatment/Interventions Gait training;Therapeutic exercise;Therapeutic activities;Functional mobility training;Patient/family education;Manual techniques;Dry needling;Joint Manipulations    PT Next Visit Plan continue with LE strengthening    PT Home Exercise Plan heel raises, bridges, hip abd, hip ext, hip add    Consulted and Agree with Plan of Care Patient           Patient will benefit from skilled therapeutic intervention in order to improve the following deficits and impairments:  Abnormal gait, Difficulty walking, Decreased strength  Visit Diagnosis: Muscle weakness (generalized)     Problem List Patient Active Problem List   Diagnosis Date Noted  . Cerebrovascular disease 12/11/2018  . 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 Left hand weakness 12/09/2018  . Malignant neoplasm of endometrium ([place]) 10/28/2013    Lieutenant Diego PT, DPT 2:22 PM,09/01/19   [place] MAIN [facility] SERVICES [address] [place], [place], [address] Phone: [phone]   Fax:  [phone]  Name: Aleisha Paone MRN: [id number] Date of Birth: 1960-09-15

## 2019-09-03 ENCOUNTER — Ambulatory Visit: Payer: BC Managed Care – PPO

## 2019-09-03 ENCOUNTER — Other Ambulatory Visit: Payer: Self-pay

## 2019-09-03 ENCOUNTER — Encounter: Payer: Self-pay | Admitting: Physical Therapy

## 2019-09-03 DIAGNOSIS — M6281 Muscle weakness (generalized): Secondary | ICD-10-CM | POA: Diagnosis not present

## 2019-09-03 NOTE — Therapy (Signed)
[place] MAIN [facility] SERVICES [address] [place], [place], [address] Phone: [phone]   Fax:  [phone]  Physical Therapy Treatment  Patient Details  Name: Emily Soto MRN: [id number] Date of Birth: Apr 09, 1960 Referring Provider (PT): nolte, Alinda Dooms   Encounter Date: 09/03/2019   PT End of Session - 09/03/19 1400    Visit Number 7    Number of Visits 17    Date for PT Re-Evaluation 10/08/19    Authorization Time Period 08/13/19-10/08/19    PT Start Time 5638    PT Stop Time 1430    PT Time Calculation (min) 43 min    Activity Tolerance Patient tolerated treatment well;No increased pain    Behavior During Therapy WFL for tasks assessed/performed           Past Medical History:  Diagnosis Date  . B12 deficiency   . Endometrial cancer ([place])   . GERD (gastroesophageal reflux disease)     History reviewed. No pertinent surgical history.  There were no vitals filed for this visit.   Subjective Assessment - 09/03/19 1354    Subjective Patient stated she was very sore yesterday. Participated in therapy and surivorship class (performed 30 minutes of exercises).    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  She was having LUE motor control difficulty that got better. 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 She has had chemo dec 28- April 23. She has had no PT for this epsode.    How long can you stand comfortably? 20 minute walk    How long can you walk comfortably? 15 mintues    Patient Stated Goals to get stronger    Currently in Pain? No/denies           Treatment:   Ther-ex   Nu-step  x 5 mins  SPM >40, level 2 Hip flexion marches with 2# ankle weights in seated 2 x 10 bilateral  LAQ 2# AW 2x10 Hip abduction with 2#  2 x 10 bilateral  Hip extension with 2#  2 x 10 bilateral  Lunges to BOSU ball x 10 BLE  Heel raises x 30  with 2#AW Step ups to 6-inch stool x 20   Step taps to 6in step x2  minutes for coordination/endurance Leg press 40 lbs x 20 x 3    Pt response/clinical impression: Several seated rest breaks due to pt fatigue, but pt very motivated to maximize performance. Close supervision for safety and intermittent cueing for exercise form needed. The patient would benefit from further intervention to continue to progress program as able and maximize pt function.      PT Education - 09/03/19 1355    Education Details therex form/technique    Person(s) Educated Patient    Methods Explanation;Demonstration;Tactile cues    Comprehension Verbalized understanding;Returned demonstration;Verbal cues required;Need further instruction            PT Short Term Goals - 08/13/19 1727      PT SHORT TERM GOAL #1   Title Patient will be independent in home exercise program to improve strength/mobility for better functional independence with ADLs.    Time 4    Period Weeks    Status New    Target Date 09/10/19             PT Long Term Goals - 08/13/19 1728      PT LONG TERM GOAL #1   Title Patient  will increase six minute walk test distance > 80 feet for progression to community ambulator and improve gait ability    Time 8    Period Weeks    Status New    Target Date 10/08/19      PT LONG TERM GOAL #2   Title Patient will increase BLE gross strength to 4+/5 as to improve functional strength for independent gait, increased standing tolerance and increased ADL ability.    Time 8    Period Weeks    Status New    Target Date 10/08/19      PT LONG TERM GOAL #3   Title Patient will be able to perform household work/ chores without increase in symptoms.    Time 8    Period Weeks    Status New    Target Date 10/08/19      PT LONG TERM GOAL #4   Title Patient will increase lower extremity functional scale to >60/80 to demonstrate improved functional mobility and increased tolerance with ADLs    Baseline 08/13/19=51/80    Time 8    Period Weeks    Target Date  10/08/19                 Plan - 09/03/19 1359    Stability/Clinical Decision Making Stable/Uncomplicated    Rehab Potential Good    PT Frequency 2x / week    PT Duration 8 weeks    PT Treatment/Interventions Gait training;Therapeutic exercise;Therapeutic activities;Functional mobility training;Patient/family education;Manual techniques;Dry needling;Joint Manipulations    PT Next Visit Plan continue with LE strengthening    PT Home Exercise Plan heel raises, bridges, hip abd, hip ext, hip add    Consulted and Agree with Plan of Care Patient           Patient will benefit from skilled therapeutic intervention in order to improve the following deficits and impairments:  Abnormal gait, Difficulty walking, Decreased strength  Visit Diagnosis: Muscle weakness (generalized)     Problem List Patient Active Problem List   Diagnosis Date Noted  . Cerebrovascular disease 12/11/2018  . 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 Left hand weakness 12/09/2018  . Malignant neoplasm of endometrium ([place]) 10/28/2013    Lieutenant Diego PT, DPT 3:27 PM,09/03/19   [place] MAIN [facility] SERVICES [address] [place], [place], [address] Phone: [phone]   Fax:  [phone]  Name: Emily Soto MRN: [id number] Date of Birth: 12-29-1960

## 2019-09-08 ENCOUNTER — Ambulatory Visit: Payer: BC Managed Care – PPO | Admitting: Physical Therapy

## 2019-09-08 ENCOUNTER — Encounter: Payer: Self-pay | Admitting: Physical Therapy

## 2019-09-08 ENCOUNTER — Other Ambulatory Visit: Payer: Self-pay

## 2019-09-08 DIAGNOSIS — M6281 Muscle weakness (generalized): Secondary | ICD-10-CM | POA: Diagnosis not present

## 2019-09-08 NOTE — Therapy (Signed)
[place] MAIN [facility] SERVICES [address] [place], [place], [address] Phone: [phone]   Fax:  [phone]  Physical Therapy Treatment  Patient Details  Name: Emily Soto MRN: [id number] Date of Birth: 28-Jun-1960 Referring Provider (PT): nolte, Alinda Dooms   Encounter Date: 09/08/2019   PT End of Session - 09/08/19 1354    Visit Number 8    Number of Visits 17    Date for PT Re-Evaluation 10/08/19    Authorization Time Period 08/13/19-10/08/19    Activity Tolerance Patient tolerated treatment well;No increased pain    Behavior During Therapy WFL for tasks assessed/performed           Past Medical History:  Diagnosis Date  . B12 deficiency   . Endometrial cancer ([place])   . GERD (gastroesophageal reflux disease)     History reviewed. No pertinent surgical history.  There were no vitals filed for this visit.   Subjective Assessment - 09/08/19 1353    Subjective Patient stated she was very sore yesterday. Participated in therapy and surivorship class (performed 30 minutes of exercises).    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  She was having LUE motor control difficulty that got better. 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 She has had chemo dec 28- April 23. She has had no PT for this epsode.    How long can you stand comfortably? 20 minute walk    How long can you walk comfortably? 15 mintues    Patient Stated Goals to get stronger           Ther-ex  Octane fitness x 5 mins , L 4  Hip flexion marches with 2# ankle weights x 10 bilateral Hip abduction with 2# x 10 bilateral HS curls with 2# AW x 10 bilateral Hip extension with 2# x 10 bilateral Quantum leg press 40 # x 20 x 3 sets  Sit to stand without UE support from regular height chair x 10   Standing 3-way hip: flex, abd, ext x 15 x 2 sets Lunges to BOSU ball with 2 1/2 lbs  x 15 BLE TM x 1. 0 x 6 mins   side stepping over hurdle x 20   High marching x 20 BLE Step ups to 6-inch stool x 20  Eccentric step downs from 3 inch stool x 10 verbal cues to complete slow and tap heel.  BUE CGA Patient needs occasional verbal cueing to improve posture and cueing to correctly perform exercises slowly, holding at end of range to increase motor firing of desired muscle to encourage fatigue.                           PT Education - 09/08/19 1353    Education Details HEP    Person(s) Educated Patient    Methods Explanation    Comprehension Verbalized understanding;Returned demonstration;Verbal cues required            PT Short Term Goals - 08/13/19 1727      PT SHORT TERM GOAL #1   Title Patient will be independent in home exercise program to improve strength/mobility for better functional independence with ADLs.    Time 4    Period Weeks    Status New    Target Date 09/10/19             PT Long Term Goals - 08/13/19 1728  PT LONG TERM GOAL #1   Title Patient will increase six minute walk test distance > 80 feet for progression to community ambulator and improve gait ability    Time 8    Period Weeks    Status New    Target Date 10/08/19      PT LONG TERM GOAL #2   Title Patient will increase BLE gross strength to 4+/5 as to improve functional strength for independent gait, increased standing tolerance and increased ADL ability.    Time 8    Period Weeks    Status New    Target Date 10/08/19      PT LONG TERM GOAL #3   Title Patient will be able to perform household work/ chores without increase in symptoms.    Time 8    Period Weeks    Status New    Target Date 10/08/19      PT LONG TERM GOAL #4   Title Patient will increase lower extremity functional scale to >60/80 to demonstrate improved functional mobility and increased tolerance with ADLs    Baseline 08/13/19=51/80    Time 8    Period Weeks    Target Date 10/08/19                 Plan - 09/08/19 1354    Clinical  Impression Statement Patient demonstrates improved stability and strength allowing patient to perform longer duration standing interventions with rest periods.  Patient performs beginning and intermediate open and closed chain exercises to shift weight and perform single leg standing activities with min assist. Patient fatigues quickly with exercises requiring rest breaks at this time. Patient will continue to benefit from skilled physical therapy to improve pain and mobility.   Stability/Clinical Decision Making Stable/Uncomplicated    Rehab Potential Good    PT Frequency 2x / week    PT Duration 8 weeks    PT Treatment/Interventions Gait training;Therapeutic exercise;Therapeutic activities;Functional mobility training;Patient/family education;Manual techniques;Dry needling;Joint Manipulations    PT Next Visit Plan continue with LE strengthening    PT Home Exercise Plan heel raises, bridges, hip abd, hip ext, hip add    Consulted and Agree with Plan of Care Patient           Patient will benefit from skilled therapeutic intervention in order to improve the following deficits and impairments:  Abnormal gait, Difficulty walking, Decreased strength  Visit Diagnosis: Muscle weakness (generalized)     Problem List Patient Active Problem List   Diagnosis Date Noted  . Cerebrovascular disease 12/11/2018  . 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 Left hand weakness 12/09/2018  . Malignant neoplasm of endometrium ([place]) 10/28/2013    Alanson Puls , PT DPT 09/08/2019, 2:07 PM  [place] MAIN [facility] SERVICES [address] [place], [place], [address] Phone: [phone]   Fax:  [phone]  Name: Rania Prothero MRN: [id number] Date of Birth: October 18, 1960

## 2019-09-10 ENCOUNTER — Encounter: Payer: Self-pay | Admitting: Physical Therapy

## 2019-09-10 ENCOUNTER — Other Ambulatory Visit: Payer: Self-pay

## 2019-09-10 ENCOUNTER — Ambulatory Visit: Payer: BC Managed Care – PPO | Admitting: Physical Therapy

## 2019-09-10 DIAGNOSIS — M6281 Muscle weakness (generalized): Secondary | ICD-10-CM

## 2019-09-10 NOTE — Therapy (Signed)
[place] MAIN [facility] SERVICES [address] [place], [place], [address] Phone: [phone]   Fax:  [phone]  Physical Therapy Treatment  Patient Details  Name: Emily Soto MRN: [id number] Date of Birth: 08-29-1960 Referring Provider (PT): nolte, Alinda Dooms   Encounter Date: 09/10/2019   PT End of Session - 09/10/19 1347    Visit Number 9    Number of Visits 17    Date for PT Re-Evaluation 10/08/19    Authorization Time Period 08/13/19-10/08/19    PT Start Time 0145    PT Stop Time 0225    PT Time Calculation (min) 40 min    Equipment Utilized During Treatment Gait belt    Activity Tolerance Patient tolerated treatment well;No increased pain    Behavior During Therapy WFL for tasks assessed/performed           Past Medical History:  Diagnosis Date  . B12 deficiency   . Endometrial cancer ([place])   . GERD (gastroesophageal reflux disease)     History reviewed. No pertinent surgical history.  There were no vitals filed for this visit.   Subjective Assessment - 09/10/19 1346    Subjective Patient stated she is tired today.    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  She was having LUE motor control difficulty that got better. 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 She has had chemo dec 28- April 23. She has had no PT for this epsode.    How long can you stand comfortably? 20 minute walk    How long can you walk comfortably? 15 mintues    Patient Stated Goals to get stronger    Currently in Pain? No/denies           Ther-ex  Octane fitness x 5 mins , L 4  Toe tapping x 2 mins from blue foam to step.  Lunges to BOSU ball x 15 BLE Step up to 6 inch stool x 20  High marching x 20 BLE Step ups to 6-inch stool x 20  TM x 12 mins , 1.2 miles / hour Eccentric step downs from 3 inch stool x 10 verbal cues to complete slow and tap heel.  BUE CGA  Patient needs occasional verbal cueing to improve  posture and cueing to correctly perform exercises slowly, holding at end of range to increase motor firing of desired muscle to encourage fatigue.                           PT Education - 09/10/19 1346    Education Details HEP    Person(s) Educated Patient    Methods Explanation    Comprehension Verbalized understanding;Tactile cues required;Need further instruction            PT Short Term Goals - 08/13/19 1727      PT SHORT TERM GOAL #1   Title Patient will be independent in home exercise program to improve strength/mobility for better functional independence with ADLs.    Time 4    Period Weeks    Status New    Target Date 09/10/19             PT Long Term Goals - 08/13/19 1728      PT LONG TERM GOAL #1   Title Patient will increase six minute walk test distance > 80 feet for progression to community ambulator and improve gait ability  Time 8    Period Weeks    Status New    Target Date 10/08/19      PT LONG TERM GOAL #2   Title Patient will increase BLE gross strength to 4+/5 as to improve functional strength for independent gait, increased standing tolerance and increased ADL ability.    Time 8    Period Weeks    Status New    Target Date 10/08/19      PT LONG TERM GOAL #3   Title Patient will be able to perform household work/ chores without increase in symptoms.    Time 8    Period Weeks    Status New    Target Date 10/08/19      PT LONG TERM GOAL #4   Title Patient will increase lower extremity functional scale to >60/80 to demonstrate improved functional mobility and increased tolerance with ADLs    Baseline 08/13/19=51/80    Time 8    Period Weeks    Target Date 10/08/19                 Plan - 09/10/19 1349    Clinical Impression Statement Patient instructed in advanced LE strengthening in open and closed chain. Patient has no pain or pain behaviors during treatment. Patient would benefit from additional skilled PT  intervention to improve strength and overall endurance.    Stability/Clinical Decision Making Stable/Uncomplicated    Rehab Potential Good    PT Frequency 2x / week    PT Duration 8 weeks    PT Treatment/Interventions Gait training;Therapeutic exercise;Therapeutic activities;Functional mobility training;Patient/family education;Manual techniques;Dry needling;Joint Manipulations    PT Next Visit Plan continue with LE strengthening    PT Home Exercise Plan heel raises, bridges, hip abd, hip ext, hip add    Consulted and Agree with Plan of Care Patient           Patient will benefit from skilled therapeutic intervention in order to improve the following deficits and impairments:  Abnormal gait, Difficulty walking, Decreased strength  Visit Diagnosis: Muscle weakness (generalized)     Problem List Patient Active Problem List   Diagnosis Date Noted  . Cerebrovascular disease 12/11/2018  . 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 Left hand weakness 12/09/2018  . Malignant neoplasm of endometrium ([place]) 10/28/2013    Alanson Puls, PT DPT 09/10/2019, 1:50 PM  [place] MAIN [facility] SERVICES [address] [place], [place], [address] Phone: [phone]   Fax:  [phone]  Name: Emily Soto MRN: [id number] Date of Birth: [birth year]

## 2019-09-15 ENCOUNTER — Encounter: Payer: BC Managed Care – PPO | Admitting: Physical Therapy

## 2019-09-17 ENCOUNTER — Encounter: Payer: BC Managed Care – PPO | Admitting: Physical Therapy

## 2019-09-22 ENCOUNTER — Ambulatory Visit: Payer: BC Managed Care – PPO | Admitting: Physical Therapy

## 2019-09-23 ENCOUNTER — Ambulatory Visit: Payer: BC Managed Care – PPO | Admitting: Physical Therapy

## 2019-09-24 ENCOUNTER — Ambulatory Visit: Payer: BC Managed Care – PPO | Admitting: Physical Therapy

## 2019-09-25 ENCOUNTER — Encounter: Payer: BC Managed Care – PPO | Admitting: Physical Therapy

## 2019-09-29 ENCOUNTER — Encounter: Payer: BC Managed Care – PPO | Admitting: Physical Therapy

## 2019-09-30 ENCOUNTER — Ambulatory Visit: Payer: BC Managed Care – PPO | Admitting: Physical Therapy

## 2019-10-01 ENCOUNTER — Encounter: Payer: BC Managed Care – PPO | Admitting: Physical Therapy

## 2019-10-02 ENCOUNTER — Encounter: Payer: BC Managed Care – PPO | Admitting: Physical Therapy

## 2019-10-08 ENCOUNTER — Encounter: Payer: BC Managed Care – PPO | Admitting: Physical Therapy

## 2019-10-13 ENCOUNTER — Encounter: Payer: BC Managed Care – PPO | Admitting: Physical Therapy

## 2019-10-15 ENCOUNTER — Encounter: Payer: BC Managed Care – PPO | Admitting: Physical Therapy

## 2019-10-22 ENCOUNTER — Encounter: Payer: BC Managed Care – PPO | Admitting: Physical Therapy

## 2019-10-28 ENCOUNTER — Encounter: Payer: BC Managed Care – PPO | Admitting: Physical Therapy

## 2019-10-30 ENCOUNTER — Encounter: Payer: BC Managed Care – PPO | Admitting: Physical Therapy

## 2019-11-04 ENCOUNTER — Encounter: Payer: BC Managed Care – PPO | Admitting: Physical Therapy

## 2019-11-06 ENCOUNTER — Encounter: Payer: BC Managed Care – PPO | Admitting: Physical Therapy

## 2019-11-11 ENCOUNTER — Encounter: Payer: BC Managed Care – PPO | Admitting: Physical Therapy

## 2019-11-13 ENCOUNTER — Encounter: Payer: BC Managed Care – PPO | Admitting: Physical Therapy

## 2019-11-18 ENCOUNTER — Encounter: Payer: BC Managed Care – PPO | Admitting: Physical Therapy

## 2019-11-20 ENCOUNTER — Encounter: Payer: BC Managed Care – PPO | Admitting: Physical Therapy

## 2019-11-25 ENCOUNTER — Encounter: Payer: BC Managed Care – PPO | Admitting: Physical Therapy

## 2019-11-27 ENCOUNTER — Encounter: Payer: BC Managed Care – PPO | Admitting: Physical Therapy

## 2019-12-02 ENCOUNTER — Encounter: Payer: BC Managed Care – PPO | Admitting: Physical Therapy

## 2019-12-04 ENCOUNTER — Encounter: Payer: BC Managed Care – PPO | Admitting: Physical Therapy

## 2019-12-09 ENCOUNTER — Encounter: Payer: BC Managed Care – PPO | Admitting: Physical Therapy

## 2019-12-11 ENCOUNTER — Encounter: Payer: BC Managed Care – PPO | Admitting: Physical Therapy

## 2020-05-03 IMAGING — CT CT ABD-PELV W/ CM
2 of 5 series · 16 of 46 positions shown, 18 images · IV contrast (APPLIED)
Comparison: None

CLINICAL DATA: Endometrial carcinoma.  Now with new lung lesions.

EXAM:
CT ABDOMEN AND PELVIS WITH CONTRAST
TECHNIQUE: Multidetector CT imaging of the abdomen and pelvis was performed
using the standard protocol following bolus administration of
intravenous contrast.
CONTRAST:  100mL OMNIPAQUE IOHEXOL 350 MG/ML SOLN

[Series 2: routine abd/pel with · axial · 0.98mm/px · z∈[-880,-385]mm · 13 of 111 slices shown, 15 images]
[im 6/111  soft-tissue]
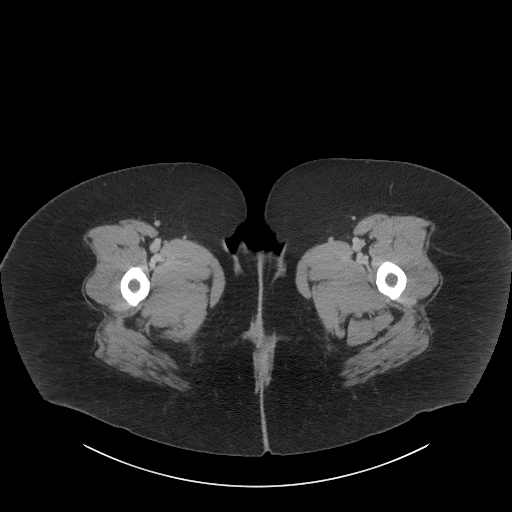
[im 6/111  bone]
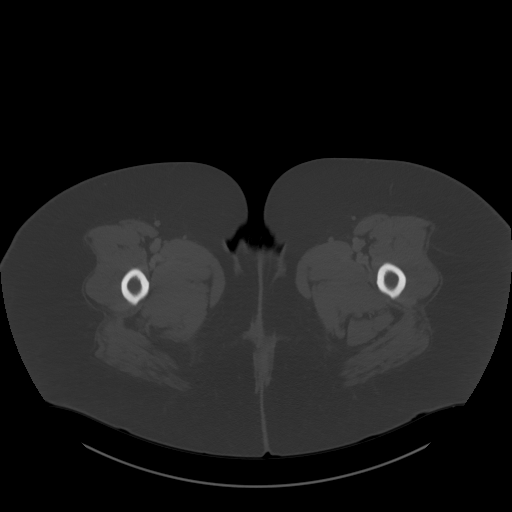
[im 18/111  soft-tissue]
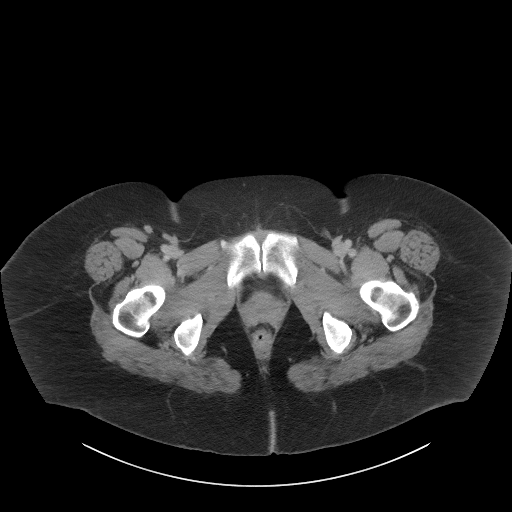
[im 24/111  soft-tissue]
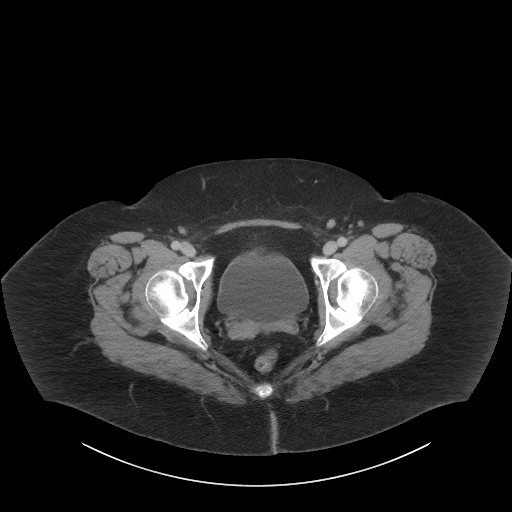
[im 29/111  soft-tissue]
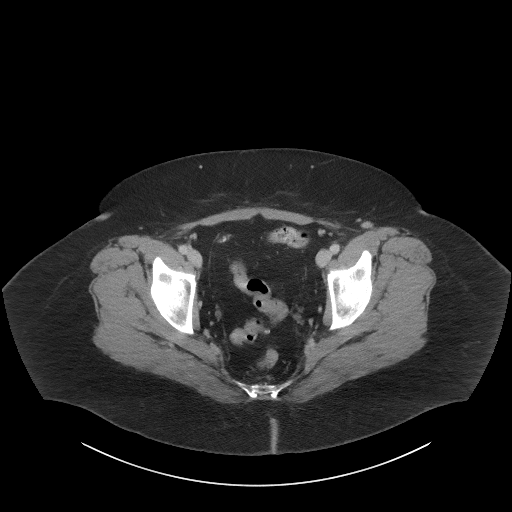
[im 41/111  soft-tissue]
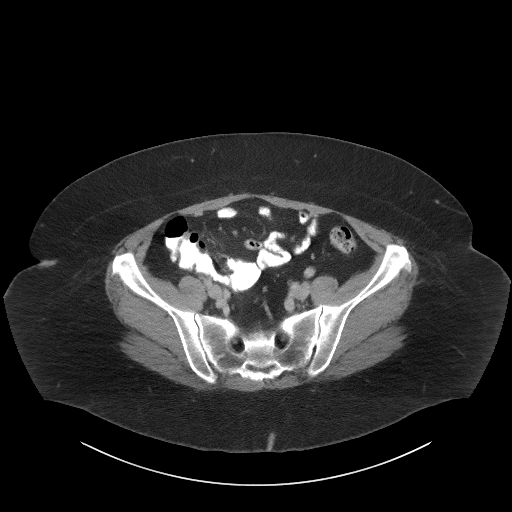
[im 47/111  soft-tissue]
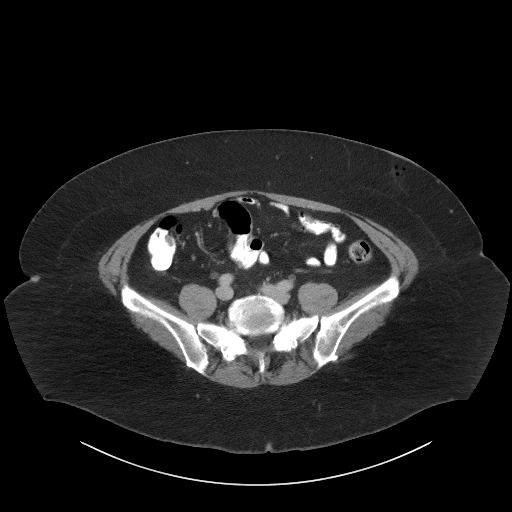
[im 58/111  soft-tissue]
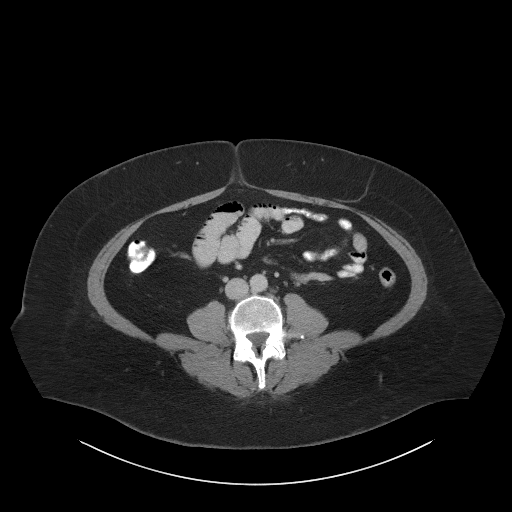
[im 64/111  soft-tissue]
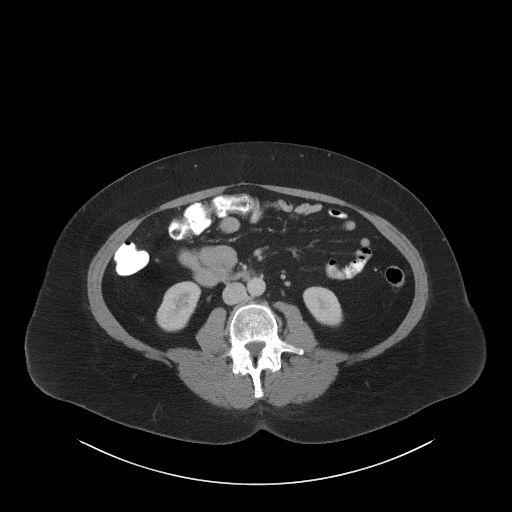
[im 70/111  soft-tissue]
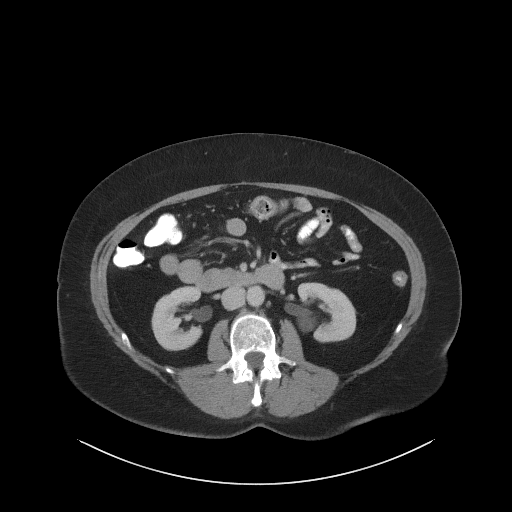
[im 70/111  bone]
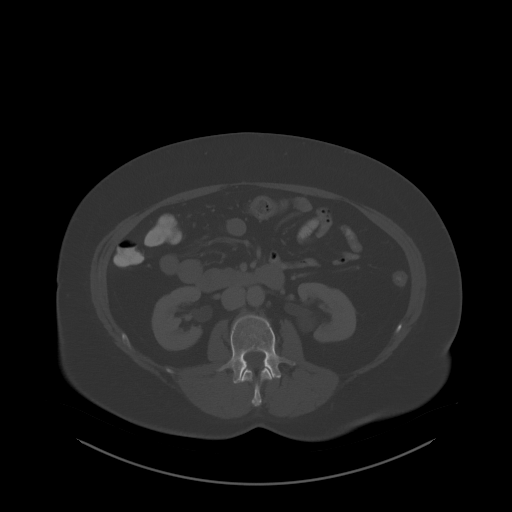
[im 82/111  soft-tissue]
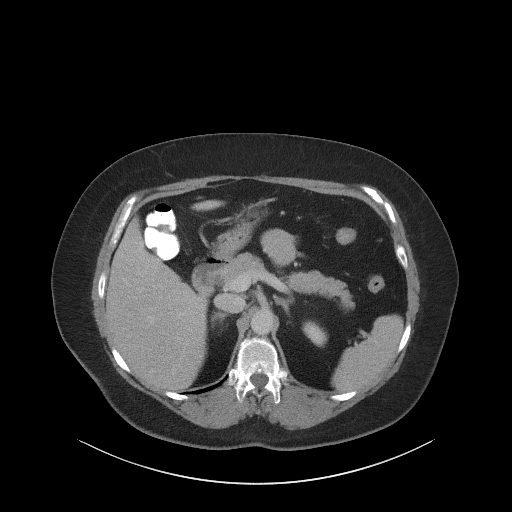
[im 87/111  soft-tissue]
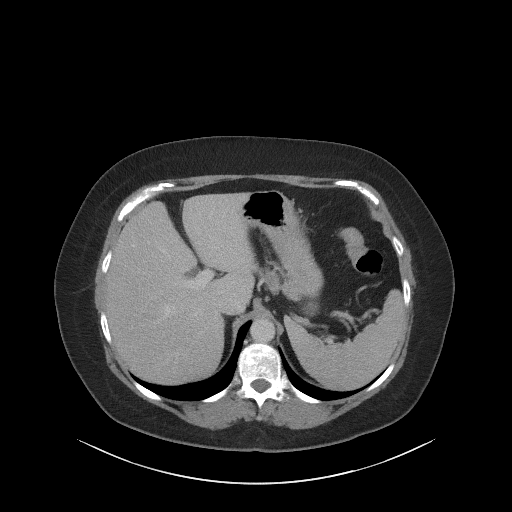
[im 93/111  soft-tissue]
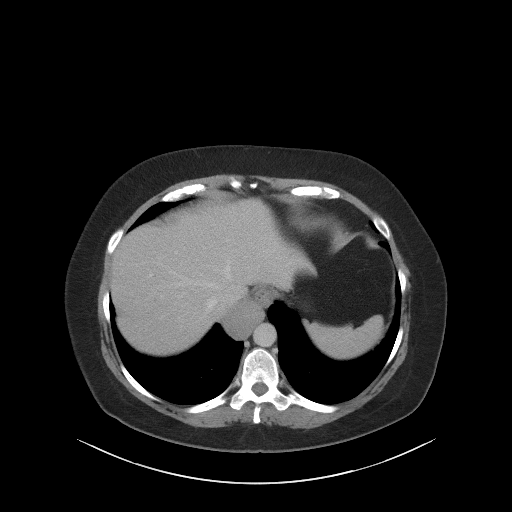
[im 105/111  soft-tissue]
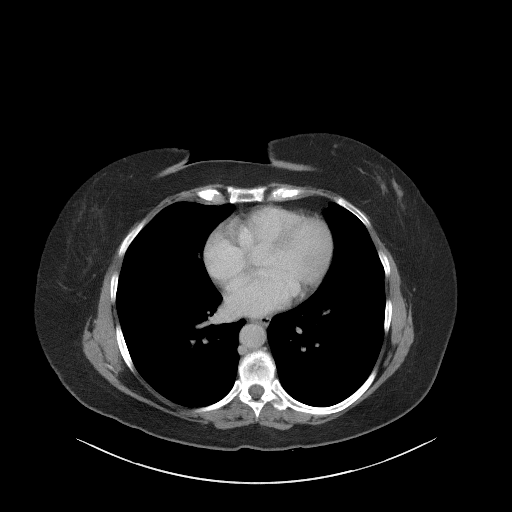

[Series 5: coronal st · coronal · 0.98mm/px · 3 of 105 slices shown]
[im 35/105  soft-tissue]
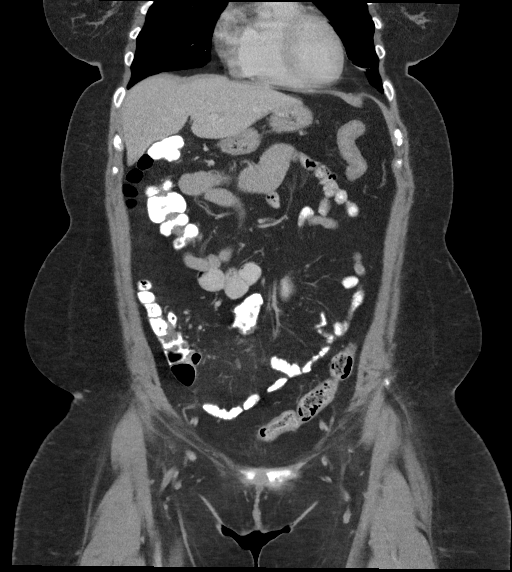
[im 47/105  soft-tissue]
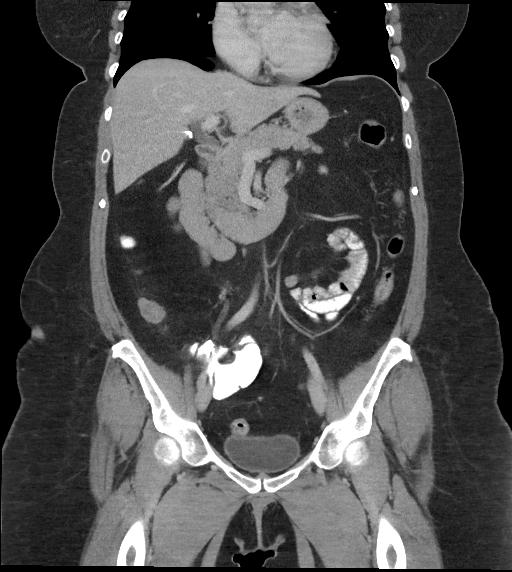
[im 58/105  soft-tissue]
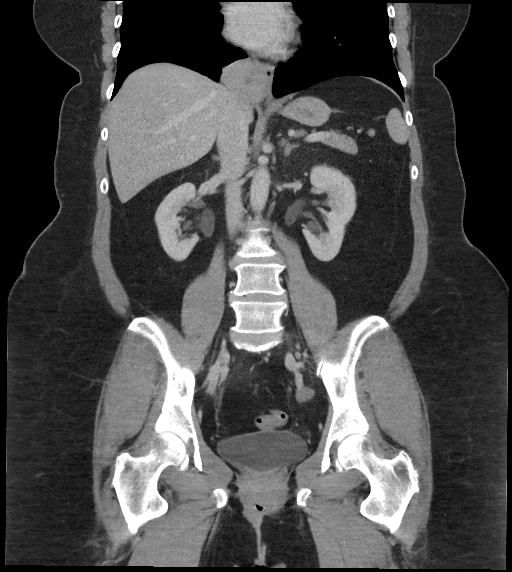

[16 of 46 positions shown; findings below may reference images not displayed]

FINDINGS: Lower chest: The right lower lung mass measures 4.5 cm, image [DATE].
No acute findings identified within the lung bases.

Hepatobiliary: Within segment 2 there is a 0.9 cm low-density
structure, image 45/5. Indeterminate. Status post cholecystectomy.
No biliary dilatation.

Pancreas: Unremarkable. No pancreatic ductal dilatation or
surrounding inflammatory changes.

Spleen: Normal in size without focal abnormality.

Adrenals/Urinary Tract: Normal appearance of the adrenal glands.
Left kidney parapelvic cyst. No mass or hydronephrosis. Bladder
normal.

Stomach/Bowel: Stomach is within normal limits. Appendix appears
normal. No evidence of bowel wall thickening, distention, or
inflammatory changes.

Vascular/Lymphatic: No significant vascular findings are present. No
enlarged abdominal or pelvic lymph nodes.

Reproductive: Status post hysterectomy. Within the left adnexa there
is a solid mass measuring 3.4 x 2.3 cm, image 77/2.

Other: Peritoneal lesion within the right lower quadrant of the
abdomen which abuts the posterior wall of the ascending colon
measures 3.2 x 2.3 cm, image 61/2.

No ascites.  No omental cake

Musculoskeletal: No acute or significant osseous findings.
IMPRESSION: 1. Right paracolic gutter peritoneal mass and left adnexal mass
identified concerning for peritoneal metastasis.
2. Indeterminate lesion within segment 2 measuring 9 mm. Cannot rule
out liver metastasis.
3. Right lung base mass is again noted, also concerning for
metastatic disease.

## 2021-08-23 ENCOUNTER — Ambulatory Visit: Payer: BC Managed Care – PPO | Attending: Physician Assistant | Admitting: Occupational Therapy

## 2021-08-23 DIAGNOSIS — M65331 Trigger finger, right middle finger: Secondary | ICD-10-CM | POA: Insufficient documentation

## 2021-08-23 DIAGNOSIS — M6281 Muscle weakness (generalized): Secondary | ICD-10-CM | POA: Insufficient documentation

## 2021-08-23 DIAGNOSIS — M65312 Trigger thumb, left thumb: Secondary | ICD-10-CM | POA: Insufficient documentation

## 2021-08-23 DIAGNOSIS — M25532 Pain in left wrist: Secondary | ICD-10-CM | POA: Insufficient documentation

## 2021-08-23 DIAGNOSIS — M79641 Pain in right hand: Secondary | ICD-10-CM | POA: Insufficient documentation

## 2021-08-23 DIAGNOSIS — M654 Radial styloid tenosynovitis [de Quervain]: Secondary | ICD-10-CM | POA: Insufficient documentation

## 2021-08-23 DIAGNOSIS — M65311 Trigger thumb, right thumb: Secondary | ICD-10-CM | POA: Insufficient documentation

## 2021-08-23 NOTE — Therapy (Signed)
[place] [facility] [address] [place], [address] Phone: [phone]   Fax:  [phone]  Occupational Therapy Screen:  Patient Details  Name: Emily Soto MRN: [id number] Date of Birth: 1960/08/29 No data recorded  Encounter Date: 08/23/2021   OT End of Session - 08/23/21 1401     Visit Number 0             Past Medical History:  Diagnosis Date   B12 deficiency    Endometrial cancer ([place])    GERD (gastroesophageal reflux disease)     No past surgical history on file.  There were no vitals filed for this visit.   Subjective Assessment - 08/23/21 1359     Subjective  I just found out about you yesterday.  I really do not want surgery in my right hand and my left.  I am going through cancer treatment.  I have trigger thumb on my right as well as my middle finger.  I am trying to get a better.  As well as I have a trigger thumb on my left but that is better.  But I have tendinitis here at the base of my thumb.  I seen surgeon in the past but I do not want to have surgery can you help me please    Currently in Pain? Yes    Pain Score 7     Pain Location Hand    Pain Orientation Right;Left    Pain Descriptors / Indicators Tender;Tightness;Aching                    So I have about for 9 months now right thumb trigger finger as well as my middle finger.  Pain in the middle finger is about a 4/10.  In the thumb about 8/10. I googled and got some exercises that I started this weekend on my thumb.  And hoping that therapy can help maybe.  The middle finger is little worse.  They both wants a trigger during the day and at night.  In the morning really stiff.  I do not want surgery.  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 I wore a splint for a few months but is so big and bulky. My thumb also is stiff.  Had a trigger thumb on the  left 2. I can get a order for my family doctor if you can help me. Patient tender over A1 pulleys of left thumb.  Tenderness over A1 pulley on the right thumb and middle finger. Painful PIP joint and stiffness on the right middle. Stiffness but able to do passive range of motion and tolerating it on the right thumb.  Left patient tenderness over first dorsal compartment/distal radius.  As well as positive Wynn [place]. Provided patient with some home program for contrast as well as ice massage and joint protection. She can ask her doctor for OT to eval and treat.                           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, [place], [address] Phone: [phone]   Fax:  [phone]  Name: Emily Soto MRN: [id number] Date of Birth: [birth year]

## 2021-08-24 ENCOUNTER — Ambulatory Visit: Payer: BC Managed Care – PPO

## 2021-08-30 ENCOUNTER — Encounter: Payer: Self-pay | Admitting: Occupational Therapy

## 2021-08-30 ENCOUNTER — Ambulatory Visit: Payer: BC Managed Care – PPO | Admitting: Occupational Therapy

## 2021-08-30 DIAGNOSIS — M79641 Pain in right hand: Secondary | ICD-10-CM | POA: Diagnosis present

## 2021-08-30 DIAGNOSIS — M65312 Trigger thumb, left thumb: Secondary | ICD-10-CM | POA: Diagnosis present

## 2021-08-30 DIAGNOSIS — M654 Radial styloid tenosynovitis [de Quervain]: Secondary | ICD-10-CM | POA: Diagnosis not present

## 2021-08-30 DIAGNOSIS — M25532 Pain in left wrist: Secondary | ICD-10-CM

## 2021-08-30 DIAGNOSIS — M65331 Trigger finger, right middle finger: Secondary | ICD-10-CM

## 2021-08-30 DIAGNOSIS — M6281 Muscle weakness (generalized): Secondary | ICD-10-CM

## 2021-08-30 DIAGNOSIS — M65311 Trigger thumb, right thumb: Secondary | ICD-10-CM

## 2021-08-30 NOTE — Therapy (Signed)
[facility] [address], [place], [address] Phone: [phone]   Fax:  [phone]  Occupational Therapy Evaluation  Patient Details  Name: Emily Soto MRN: [id number] Date of Birth: [birth year] Referring Provider (OT): Dr. Emily Filbert   Encounter Date: 08/30/2021   OT End of Session - 08/30/21 1336     Visit Number 1    Number of Visits 12    Date for OT Re-Evaluation 10/25/21    OT Start Time 1125    OT Stop Time 1217    OT Time Calculation (min) 52 min    Activity Tolerance Patient tolerated treatment well    Behavior During Therapy [facility] for tasks assessed/performed             Past Medical History:  Diagnosis Date   B12 deficiency    Endometrial cancer ([place])    GERD (gastroesophageal reflux disease)     History reviewed. No pertinent surgical history.  There were no vitals filed for this visit.   Subjective Assessment - 08/30/21 1327     Subjective  I am so glad I heard about you because I do not want surgery to my hands with my cancer.  I am going through cancer treatment.  I have trigger thumb on my right as well as my middle finger.  I am trying to get a better.  As well as I have a trigger thumb on my left but that is better.  But I have tendinitis here at the base of my L thumb.  I seen surgeon in the past but I do not want to have surgery can you help me please    Pertinent History Patient's endometrial cancer has been stable. Has not had a colonoscopy, did have a normal Cologuard a number of years ago. Has progressive right hand issues when see by Dr Sabra Heck in June - refer to Dr Amedeo Plenty - but pt do not want surgery - , particular middle finger trigger finger and thumb - as well as tenosynitivitis R thumb - pt refer to OT    Patient Stated Goals I want to avoid surgery with having cancer treatment.  I want the pain better so I can use my hands and the things I like to do.    Currently in  Pain? Yes    Pain Score 10-Worst pain ever   10/10 right thumb A1 pulley' 5/10 right third A1 pulley; 7/10 left first dorsal compartment   Pain Location Hand    Pain Orientation Right;Left    Pain Descriptors / Indicators Tender;Tightness;Aching;Throbbing    Pain Type Acute pain;Chronic pain    Pain Onset More than a month ago    Pain Frequency Constant               [facility] OT Assessment - 08/30/21 0001       Assessment   Medical Diagnosis Right thumb trigger finger and third trigger finger: Left first dorsal    Referring Provider (OT) Dr. Emily Filbert    Onset Date/Surgical Date 02/14/21    Hand Dominance Right    Prior Therapy PT in 2020 cancer      Home  Environment   Lives With Spouse      Prior Function   Vocation Full time employment    Leisure Teacher, play with grandkids, and telephone      Right Hand AROM   R Thumb MCP 0-60 55 Degrees    R  Thumb IP 0-80 5 Degrees    R Thumb Radial ABduction/ADduction 0-55 38    R Thumb Palmar ABduction/ADduction 0-45 44    R Thumb Opposition to Index --   Opposition to distal fifth unable to actively flex at IP   R Index  MCP 0-90 75 Degrees    R Index PIP 0-100 90 Degrees    R Long  MCP 0-90 75 Degrees    R Long PIP 0-100 92 Degrees    R Ring  MCP 0-90 80 Degrees    R Ring PIP 0-100 100 Degrees    R Little  MCP 0-90 85 Degrees    R Little PIP 0-100 100 Degrees      Left Hand AROM   L Thumb MCP 0-60 50 Degrees    L Thumb IP 0-80 50 Degrees    L Thumb Radial ADduction/ABduction 0-55 44   pain end range   L Thumb Palmar ADduction/ABduction 0-45 38    L Thumb Opposition to Index --   Opposition to base of fifth   L Index  MCP 0-90 80 Degrees    L Index PIP 0-100 100 Degrees    L Long  MCP 0-90 85 Degrees    L Long PIP 0-100 100 Degrees    L Ring  MCP 0-90 90 Degrees    L Ring PIP 0-100 100 Degrees    L Little  MCP 0-90 90 Degrees    L Little PIP 0-100 100 Degrees                  Recommend for patient to keep  her appointment next month or September with Dr. Amedeo Plenty hand surgeon in [place].  Patient consulted with this OT because want to avoid surgery because of her cancer treatment. Patient seen this date for evaluation initiated iontophoresis with dexamethasone with a small patch on the right thumb A1 pulley and the left first dorsal compartment and medium patch.  Patient was able to tolerate a 1.3 current on the right thumb 1.5 on the left first dorsal compartment.  No skin issues patient was educated on use and what to expect as well as skin check done afterwards.  Fabricated also a custom forearm-based thumb spica with a neoprene brace to left hand and wrist to decrease symptoms of tenosynovitis while doing iontophoresis. Patient continues to do contrast on bilateral hands as well as passive range of motion gentle to IP of right thumb and pain-free active range of motion for left wrist and thumb.  Patient to follow-up with me in 3 days for second session              OT Education - 08/30/21 1336     Education Details Findings of evaluation as well as ionto with dexamethasone use and splint wearing    Person(s) Educated Patient    Methods Explanation;Demonstration;Tactile cues;Verbal cues;Handout    Comprehension Verbal cues required;Returned demonstration;Verbalized understanding              OT Short Term Goals - 08/30/21 1341       OT SHORT TERM GOAL #1   Title Patient to be independent and wearing of splints as well as doing home program to decrease pain and tenderness to less than 3/10 in left and right hand.    Baseline Left wrist and hand pain 7/10.  Right thumb 10/10 over A1 pulley as well as third digit 5/10.  No splints she has a prefab that is  poor fitting and immobilizing the whole thumb on the left.  Very limited knowledge of home program    Time 4    Period Weeks    Status New    Target Date 09/27/21               OT Long Term Goals - 08/30/21 1342        OT LONG TERM GOAL #1   Title Patient right thumb and middle finger active range of motion improved to within functional limits with triggering less than 1 time a day to be able to perform ADLs and ADLs with pain less than 2/10    Baseline Right thumb and third digit pain is 5-10/10 with tenderness over the A1 pulleys no active range of motion at thumb IP, AROM at third digit but limited endrange.  During daily and in the morning.    Time 6    Period Weeks    Status New    Target Date 10/11/21      OT LONG TERM GOAL #2   Title Left thumb tenosynovitis pain decreased to less than 1/10 for patient to be wean out of thumb spica to a neoprene to be able to perform ADLs and IADLs with more ease.  .    Baseline Tenderness 7/10 over left thumb first dorsal compartment.  Decreased thumb palmar radial abduction with pain with radial abduction as well as endrange flexion extension    Time 8    Period Weeks    Status New    Target Date 10/25/21      OT LONG TERM GOAL #3   Title Patient's bilateral grip and prehension strength increased to within functional limits with no increase symptoms more than 2/10.    Baseline Not tested because of pain 10/10 with no IP flexion on the right and Duker vein tenderness over distal radius 7/10 and Finkelstein 3/10.    Time 8    Period Weeks    Status New    Target Date 10/25/21                   Plan - 08/30/21 1337     Clinical Impression Statement Patient presented at OT evaluation with a diagnosis of right dominant handtrigger thumb 10/10 tenderness over A1 pulley and unable to do any active range of motion of the thumb IP.  Third trigger finger with tenderness 5/10 over A1 pulley with decreased endrange with some triggering during the day.  In the left first dorsal compartment tenosynovitis with a tenderness 7/10 over distal radius with a positive Finkelstein of 3/10.  Patient with limited thumb range of motion in the right in all planes in all  joints.  Left decreased thumb active range of motion with pain in radial abduction as well as wrist flexion extension radial ulnar deviation at end range.  Thumb spica splint was fabricated this date for left to immobilize thumb MC CMC to decrease tenosynovitis.  As well as iontophoresis with dexamethasone was initiated to left thumb first dorsal compartment and a right thumb trigger.  Patient was able to tolerate a 1.3 current with some tenderness but no skin issues.  Ionto was removed patient was educated on use and what to expect.  Patient limited in use of bilateral hands and ADLs and IADLs by pain as well as decreased range of motion and strength.  Patient can benefit from skilled OT services to decrease pain and increase range of motion and strength to  be able to perform ADLs and IADLs.  Patient do want to avoid surgery.  Because of cancer treatment.    OT Occupational Profile and History Problem Focused Assessment - Including review of records relating to presenting problem    Occupational performance deficits (Please refer to evaluation for details): ADL's;IADL's;Rest and Sleep;Play;Work;Leisure;Social Participation    Body Structure / Function / Physical Skills ADL;Strength;Pain;Dexterity;UE functional use;Edema;IADL;ROM;Flexibility;FMC;Decreased knowledge of precautions    Rehab Potential Fair    Clinical Decision Making Limited treatment options, no task modification necessary    Comorbidities Affecting Occupational Performance: May have comorbidities impacting occupational performance    Modification or Assistance to Complete Evaluation  Min-Moderate modification of tasks or assist with assess necessary to complete eval    OT Frequency 2x / week    OT Duration 8 weeks    OT Treatment/Interventions Self-care/ADL training;Cryotherapy;Iontophoresis;Ultrasound;Fluidtherapy;Contrast Bath;Therapeutic exercise;Manual Therapy;Patient/family education;Passive range of motion;DME and/or AE  instruction;Splinting    Consulted and Agree with Plan of Care Patient             Patient will benefit from skilled therapeutic intervention in order to improve the following deficits and impairments:   Body Structure / Function / Physical Skills: ADL, Strength, Pain, Dexterity, UE functional use, Edema, IADL, ROM, Flexibility, FMC, Decreased knowledge of precautions       Visit Diagnosis: Radial styloid tenosynovitis - Plan: Ot plan of care cert/re-cert  Trigger thumb of both hands - Plan: Ot plan of care cert/re-cert  Trigger finger, right middle finger - Plan: Ot plan of care cert/re-cert  Muscle weakness (generalized) - Plan: Ot plan of care cert/re-cert  Pain in right hand - Plan: Ot plan of care cert/re-cert  Pain in left wrist - Plan: Ot plan of care cert/re-cert    Problem List Patient Active Problem List   Diagnosis Date Noted   Cerebrovascular disease 12/11/2018   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, [place], [address] Phone: [phone]   Fax:  [phone]  Name: Emily Soto MRN: [id number] Date of Birth: August 21, 1960

## 2021-09-02 ENCOUNTER — Ambulatory Visit: Payer: BC Managed Care – PPO | Admitting: Occupational Therapy

## 2021-09-02 DIAGNOSIS — M79641 Pain in right hand: Secondary | ICD-10-CM

## 2021-09-02 DIAGNOSIS — M65311 Trigger thumb, right thumb: Secondary | ICD-10-CM

## 2021-09-02 DIAGNOSIS — M654 Radial styloid tenosynovitis [de Quervain]: Secondary | ICD-10-CM | POA: Diagnosis not present

## 2021-09-02 DIAGNOSIS — M65331 Trigger finger, right middle finger: Secondary | ICD-10-CM

## 2021-09-02 DIAGNOSIS — M6281 Muscle weakness (generalized): Secondary | ICD-10-CM

## 2021-09-02 DIAGNOSIS — M25532 Pain in left wrist: Secondary | ICD-10-CM

## 2021-09-05 ENCOUNTER — Observation Stay
Admission: EM | Admit: 2021-09-05 | Discharge: 2021-09-06 | Disposition: A | Discharge status: Home / Self Care | Payer: BC Managed Care – PPO | Attending: Internal Medicine | Admitting: Internal Medicine

## 2021-09-05 ENCOUNTER — Observation Stay
Admit: 2021-09-05 | Discharge: 2021-09-05 | Disposition: A | Discharge status: Home / Self Care | Payer: BC Managed Care – PPO | Attending: Family Medicine | Admitting: Family Medicine

## 2021-09-05 ENCOUNTER — Other Ambulatory Visit: Payer: Self-pay

## 2021-09-05 ENCOUNTER — Emergency Department: Payer: BC Managed Care – PPO

## 2021-09-05 ENCOUNTER — Observation Stay: Payer: BC Managed Care – PPO

## 2021-09-05 DIAGNOSIS — G459 Transient cerebral ischemic attack, unspecified: Principal | ICD-10-CM | POA: Diagnosis present

## 2021-09-05 DIAGNOSIS — K219 Gastro-esophageal reflux disease without esophagitis: Secondary | ICD-10-CM | POA: Diagnosis not present

## 2021-09-05 DIAGNOSIS — C541 Malignant neoplasm of endometrium: Secondary | ICD-10-CM | POA: Diagnosis present

## 2021-09-05 DIAGNOSIS — Z79899 Other long term (current) drug therapy: Secondary | ICD-10-CM | POA: Diagnosis not present

## 2021-09-05 DIAGNOSIS — E785 Hyperlipidemia, unspecified: Secondary | ICD-10-CM | POA: Diagnosis not present

## 2021-09-05 DIAGNOSIS — Z7982 Long term (current) use of aspirin: Secondary | ICD-10-CM | POA: Insufficient documentation

## 2021-09-05 DIAGNOSIS — Z7902 Long term (current) use of antithrombotics/antiplatelets: Secondary | ICD-10-CM | POA: Insufficient documentation

## 2021-09-05 DIAGNOSIS — R531 Weakness: Secondary | ICD-10-CM | POA: Diagnosis present

## 2021-09-05 LAB — CBC
HCT: 36.9 % (ref 36.0–46.0)
Hemoglobin: 11.6 g/dL — ABNORMAL LOW (ref 12.0–15.0)
MCH: 25.1 pg — ABNORMAL LOW (ref 26.0–34.0)
MCHC: 31.4 g/dL (ref 30.0–36.0)
MCV: 79.7 fL — ABNORMAL LOW (ref 80.0–100.0)
Platelets: 154 10*3/uL (ref 150–400)
RBC: 4.63 MIL/uL (ref 3.87–5.11)
RDW: 15.5 % (ref 11.5–15.5)
WBC: 6.7 10*3/uL (ref 4.0–10.5)
nRBC: 0 % (ref 0.0–0.2)

## 2021-09-05 LAB — CBG MONITORING, ED: Glucose-Capillary: 132 mg/dL — ABNORMAL HIGH (ref 70–99)

## 2021-09-05 LAB — APTT: aPTT: 29 seconds (ref 24–36)

## 2021-09-05 LAB — COMPREHENSIVE METABOLIC PANEL
ALT: 16 U/L (ref 0–44)
AST: 23 U/L (ref 15–41)
Albumin: 3.8 g/dL (ref 3.5–5.0)
Alkaline Phosphatase: 114 U/L (ref 38–126)
Anion gap: 8 (ref 5–15)
BUN: 10 mg/dL (ref 8–23)
CO2: 24 mmol/L (ref 22–32)
Calcium: 9.2 mg/dL (ref 8.9–10.3)
Chloride: 111 mmol/L (ref 98–111)
Creatinine, Ser: 0.82 mg/dL (ref 0.44–1.00)
GFR, Estimated: >60 mL/min (ref >60)
Glucose, Bld: 130 mg/dL — ABNORMAL HIGH (ref 70–99)
Potassium: 3.9 mmol/L (ref 3.5–5.1)
Sodium: 143 mmol/L (ref 135–145)
Total Bilirubin: 0.4 mg/dL (ref 0.3–1.2)
Total Protein: 7.1 g/dL (ref 6.5–8.1)

## 2021-09-05 LAB — DIFFERENTIAL
Abs Immature Granulocytes: 0.02 10*3/uL (ref 0.00–0.07)
Basophils Absolute: 0 10*3/uL (ref 0.0–0.1)
Basophils Relative: 0 %
Eosinophils Absolute: 0 10*3/uL (ref 0.0–0.5)
Eosinophils Relative: 0 %
Immature Granulocytes: 0 %
Lymphocytes Relative: 12 %
Lymphs Abs: 0.8 10*3/uL (ref 0.7–4.0)
Monocytes Absolute: 0.2 10*3/uL (ref 0.1–1.0)
Monocytes Relative: 3 %
Neutro Abs: 5.6 10*3/uL (ref 1.7–7.7)
Neutrophils Relative %: 85 %

## 2021-09-05 LAB — PROTIME-INR
INR: 1 (ref 0.8–1.2)
Prothrombin Time: 12.9 seconds (ref 11.4–15.2)

## 2021-09-05 LAB — ETHANOL: Alcohol, Ethyl (B): <10 mg/dL (ref <10)

## 2021-09-05 LAB — HEMOGLOBIN A1C
Hgb A1c MFr Bld: 5.8 % — ABNORMAL HIGH (ref 4.8–5.6)
Mean Plasma Glucose: 119.76 mg/dL

## 2021-09-05 MED ORDER — ENOXAPARIN SODIUM 60 MG/0.6ML IJ SOSY
0.5000 mg/kg | PREFILLED_SYRINGE | INTRAMUSCULAR | Status: DC
  Filled 2021-09-05: qty 0.6

## 2021-09-05 MED ORDER — ENOXAPARIN SODIUM 40 MG/0.4ML IJ SOSY: 40.0000 mg | PREFILLED_SYRINGE | INTRAMUSCULAR | Status: DC

## 2021-09-05 MED ORDER — CLOPIDOGREL BISULFATE 75 MG PO TABS
75.0000 mg | ORAL_TABLET | Freq: Every day | ORAL | Status: DC
  Administered 2021-09-05 – 2021-09-06 (×2): 75 mg via ORAL
  Filled 2021-09-05 (×2): qty 1

## 2021-09-05 MED ORDER — FAMOTIDINE 20 MG PO TABS
40.0000 mg | ORAL_TABLET | Freq: Two times a day (BID) | ORAL | Status: DC
  Administered 2021-09-06: 40 mg via ORAL
  Filled 2021-09-05: qty 2

## 2021-09-05 MED ORDER — EVEROLIMUS 10 MG PO TABS: 10.0000 mg | ORAL_TABLET | Freq: Every day | ORAL | Status: DC

## 2021-09-05 MED ORDER — FERROUS SULFATE 220 (44 FE) MG/5ML PO ELIX
440.0000 mg | ORAL_SOLUTION | Freq: Every day | ORAL | Status: DC
  Filled 2021-09-05: qty 10

## 2021-09-05 MED ORDER — ASPIRIN 81 MG PO TBEC
81.0000 mg | DELAYED_RELEASE_TABLET | Freq: Every day | ORAL | Status: DC
  Administered 2021-09-06: 81 mg via ORAL
  Filled 2021-09-05: qty 1

## 2021-09-05 MED ORDER — TRAZODONE HCL 50 MG PO TABS: 25.0000 mg | ORAL_TABLET | Freq: Every evening | ORAL | Status: DC | PRN

## 2021-09-05 MED ORDER — SENNOSIDES-DOCUSATE SODIUM 8.6-50 MG PO TABS: 1.0000 | ORAL_TABLET | Freq: Every evening | ORAL | Status: DC | PRN

## 2021-09-05 MED ORDER — LETROZOLE 2.5 MG PO TABS
2.5000 mg | ORAL_TABLET | Freq: Every day | ORAL | Status: DC
  Administered 2021-09-06: 2.5 mg via ORAL
  Filled 2021-09-05: qty 1

## 2021-09-05 MED ORDER — ASPIRIN 81 MG PO CHEW
324.0000 mg | CHEWABLE_TABLET | Freq: Once | ORAL | Status: AC
  Administered 2021-09-05: 324 mg via ORAL
  Filled 2021-09-05: qty 4

## 2021-09-05 MED ORDER — ACETAMINOPHEN 325 MG PO TABS: 650.0000 mg | ORAL_TABLET | ORAL | Status: DC | PRN

## 2021-09-05 MED ORDER — ONDANSETRON HCL 4 MG/2ML IJ SOLN: 4.0000 mg | INTRAMUSCULAR | Status: DC | PRN

## 2021-09-05 MED ORDER — STROKE: EARLY STAGES OF RECOVERY BOOK: Freq: Once | Status: AC

## 2021-09-05 MED ORDER — PANTOPRAZOLE SODIUM 40 MG PO TBEC: 40.0000 mg | DELAYED_RELEASE_TABLET | Freq: Every day | ORAL | Status: DC

## 2021-09-05 MED ORDER — ACETAMINOPHEN 160 MG/5ML PO SOLN: 650.0000 mg | ORAL | Status: DC | PRN

## 2021-09-05 MED ORDER — PRAVASTATIN SODIUM 10 MG PO TABS
10.0000 mg | ORAL_TABLET | Freq: Every day | ORAL | Status: DC
  Administered 2021-09-06: 10 mg via ORAL
  Filled 2021-09-05 (×2): qty 1

## 2021-09-05 MED ORDER — ACETAMINOPHEN 650 MG RE SUPP: 650.0000 mg | RECTAL | Status: DC | PRN

## 2021-09-05 MED ORDER — SODIUM CHLORIDE 0.9 % IV SOLN: INTRAVENOUS | Status: DC

## 2021-09-05 NOTE — ED Provider Notes (Signed)
[facility] Provider Note    Event Date/Time   First MD Initiated Contact with Patient 09/05/21 1050     (approximate)   History   Weakness (Hx of stroke - LKW 11pm 7/23)   HPI  Emily Soto is a 61 y.o. female here with right leg weakness.  The patient states that she woke up this morning.  She felt well going to bed around 11:00.  She states she bumped into a dresser on the way to the other room, which is abnormal for her.  She then began to feel like her right leg was dragging and weak.  She was able to brush her teeth and use her right arm without difficulty.  Denies any vision changes, vertigo, speech difficulty.  She has a history of previous stroke, but states that these symptoms had completely resolved and more in the arm at that time.  She does not recall which side it was.  She has a history of breast cancer, is on aspirin but reports that she has been taken off Eliquis.  She states she was on this for her stroke history, denies known history of A-fib or DVTs.  She has not had any palpitations.     Physical Exam   Triage Vital Signs: ED Triage Vitals  Enc Vitals Group     BP 09/05/21 1011 103/89     Pulse Rate 09/05/21 1011 84     Resp 09/05/21 1011 18     Temp 09/05/21 1011 98.4 F (36.9 C)     Temp Source 09/05/21 1011 Oral     SpO2 09/05/21 1011 100 %     Weight 09/05/21 1016 260 lb (117.9 kg)     Height 09/05/21 1016 '5\' 7"'$  (1.702 m)     Head Circumference --      Peak Flow --      Pain Score 09/05/21 1016 0     Pain Loc --      Pain Edu? --      Excl. in [place]? --     Most recent vital signs: Vitals:   09/05/21 1559 09/05/21 1600  BP:  124/73  Pulse:  70  Resp:  17  Temp: 97.8 F (36.6 C)   SpO2:  99%     General: Awake, no distress.  CV:  Good peripheral perfusion.  Regular rate and rhythm. Resp:  Normal effort.  Regular rate and rhythm.  No murmurs. Abd:  No distention.  No tenderness.  No guarding or  rebound. Other:  Cranial nerves II through XII intact.  Strength out of 5 bilateral upper and lower extremities though subjectively weak in the right lower extremity.  Normal sensation to light touch.  Gait deferred.   ED Results / Procedures / Treatments   Labs (all labs ordered are listed, but only abnormal results are displayed) Labs Reviewed  CBC - Abnormal; Notable for the following components:      Result Value   Hemoglobin 11.6 (*)    MCV 79.7 (*)    MCH 25.1 (*)    All other components within normal limits  COMPREHENSIVE METABOLIC PANEL - Abnormal; Notable for the following components:   Glucose, Bld 130 (*)    All other components within normal limits  CBG MONITORING, ED - Abnormal; Notable for the following components:   Glucose-Capillary 132 (*)    All other components within normal limits  PROTIME-INR  APTT  DIFFERENTIAL  ETHANOL  HIV ANTIBODY (ROUTINE TESTING  W REFLEX)  HEMOGLOBIN A1C     EKG Normal sinus rhythm, ventricular rate 88.  PR 148, QRS 74, QTc 450.  No acute ST elevations or depressions.  No EKG evidence of acute ischemia or infarct.   RADIOLOGY CT head: No acute intracranial abnormality   I also independently reviewed and agree with radiologist interpretations.   PROCEDURES:  Critical Care performed: No  .1-3 Lead EKG Interpretation  Performed by: Duffy Bruce, MD Authorized by: Duffy Bruce, MD     Interpretation: normal     ECG rate:  60-70   ECG rate assessment: normal     Rhythm: sinus rhythm     Ectopy: none     Conduction: normal   Comments:     Indication: weakness   MEDICATIONS ORDERED IN ED: Medications  aspirin EC tablet 81 mg (has no administration in time range)  everolimus (AFINITOR) tablet 10 mg (10 mg Oral Not Given 09/05/21 1527)  letrozole [facility]) tablet 2.5 mg (has no administration in time range)  pravastatin (PRAVACHOL) tablet 10 mg (has no administration in time range)  pantoprazole (PROTONIX) EC  tablet 40 mg (40 mg Oral Patient Refused/Not Given 09/05/21 1527)  famotidine (PEPCID) tablet 40 mg (has no administration in time range)  ferrous sulfate 220 (44 Fe) MG/5ML solution 440 mg (has no administration in time range)   stroke: early stages of recovery book (has no administration in time range)  0.9 %  sodium chloride infusion ( Intravenous New Bag/Given 09/05/21 1600)  acetaminophen (TYLENOL) tablet 650 mg (has no administration in time range)    Or  acetaminophen (TYLENOL) 160 MG/5ML solution 650 mg (has no administration in time range)    Or  acetaminophen (TYLENOL) suppository 650 mg (has no administration in time range)  senna-docusate (Senokot-S) tablet 1 tablet (has no administration in time range)  ondansetron (ZOFRAN) injection 4 mg (has no administration in time range)  traZODone (DESYREL) tablet 25 mg (has no administration in time range)  enoxaparin (LOVENOX) injection 60 mg (has no administration in time range)  aspirin chewable tablet 324 mg (324 mg Oral Given 09/05/21 1257)     IMPRESSION / MDM / ASSESSMENT AND PLAN / ED COURSE  I reviewed the triage vital signs and the nursing notes.                               The patient is on the cardiac monitor to evaluate for evidence of arrhythmia and/or significant heart rate changes.   Ddx:  Differential includes the following, with pertinent life- or limb-threatening emergencies considered:  TIA, CVA, lumbar radiculopathy, peripheral neuropathy  Patient's presentation is most consistent with acute presentation with potential threat to life or bodily function.  MDM:  61 yo pleasant female here with RLE weakness, largely resolved. H/o breast CA, CVA in the past. On ASA only now per report. Pt neuro intact with exception of mild decreased subjective strength RLE on my exam. CBC with no leukocytosis or anemia. INR normal. CMP negative. CT head NAICA. MRI obtained, reviewed, and is negative. Pt high risk based on her  history with multiple risk factors and h/o CVA. Discussed with Dr. Rory Percy, will admit to Hospitalist for evaluation/work-up. Pt in agreement with this plan.   MEDICATIONS GIVEN IN ED: Medications  aspirin EC tablet 81 mg (has no administration in time range)  everolimus (AFINITOR) tablet 10 mg (10 mg Oral Not Given 09/05/21 1527)  letrozole (  FEMARA) tablet 2.5 mg (has no administration in time range)  pravastatin (PRAVACHOL) tablet 10 mg (has no administration in time range)  pantoprazole (PROTONIX) EC tablet 40 mg (40 mg Oral Patient Refused/Not Given 09/05/21 1527)  famotidine (PEPCID) tablet 40 mg (has no administration in time range)  ferrous sulfate 220 (44 Fe) MG/5ML solution 440 mg (has no administration in time range)   stroke: early stages of recovery book (has no administration in time range)  0.9 %  sodium chloride infusion ( Intravenous New Bag/Given 09/05/21 1600)  acetaminophen (TYLENOL) tablet 650 mg (has no administration in time range)    Or  acetaminophen (TYLENOL) 160 MG/5ML solution 650 mg (has no administration in time range)    Or  acetaminophen (TYLENOL) suppository 650 mg (has no administration in time range)  senna-docusate (Senokot-S) tablet 1 tablet (has no administration in time range)  ondansetron (ZOFRAN) injection 4 mg (has no administration in time range)  traZODone (DESYREL) tablet 25 mg (has no administration in time range)  enoxaparin (LOVENOX) injection 60 mg (has no administration in time range)  aspirin chewable tablet 324 mg (324 mg Oral Given 09/05/21 1257)     Consults:  Dr. Rory Percy   EMR reviewed  Prior Oncology notes, medical admission for small CVA     FINAL CLINICAL IMPRESSION(S) / ED DIAGNOSES   Final diagnoses:  TIA (transient ischemic attack)     Rx / DC Orders   ED Discharge Orders     None        Note:  This document was prepared using Dragon voice recognition software and may include unintentional dictation errors.    Duffy Bruce, MD 09/05/21 [phone]

## 2021-09-05 NOTE — Assessment & Plan Note (Signed)
-   We will continue statin therapy and check fasting lipids. 

## 2021-09-05 NOTE — H&P (Signed)
Emily Soto   [name]    MR#:  [id number]  DATE OF BIRTH:  [birth year]  DATE OF ADMISSION:  09/05/2021  PRIMARY CARE PHYSICIAN: Rusty Aus, MD   Patient is coming from: Home  REQUESTING/REFERRING PHYSICIAN: Duffy Bruce, MD  CHIEF COMPLAINT:   Chief Complaint  Patient presents with   Weakness    Hx of stroke - LKW 11pm 7/23    HISTORY OF PRESENT ILLNESS:  Emily Soto is a 61 y.o. Caucasian female with medical history significant for stage IV endometrial cancer status post hysterectomy, TIA, vitamin B12 deficiency and GERD, who presented to the emergency room with acute onset of right lower extremity weakness.  She stated that she was last at her baseline at 11 PM when she woke up this morning at 8 AM she ran into her nightstand.  She initially thought she was clumsy.  She denied any tingling or numbness but felt her right leg was weak.  No arm weakness or facial weakness.  No dysphagia or dysarthria.  No headache or dizziness or blurred vision.  No tinnitus or vertigo.  In the ER her right leg weakness is remarkably improved. ED Course: When she came to the ER, vital signs were within normal.  Labs revealed unremarkable CMP and CBC showed hemoglobin 11.6 hematocrit 36.9 with low MCV and MCH.  Alcohol level was less than 10. EKG as reviewed by me : EKG showed normal sinus rhythm with a rate of 88 with poor R wave progression Imaging: Noncontrast head CT scan showed no acute intracranial findings.  Brain MRI without contrast showed moderate chronic small vessel ischemic changes of the cerebral hemispheric white matter that has not changed since October 2020 with no acute MRI findings.  The patient was given 40 aspirin.  She will be admitted to an observation medical telemetry bed for further evaluation and management. PAST MEDICAL HISTORY:   Past Medical History:  Diagnosis Date   B12 deficiency    Endometrial cancer ([place])    GERD (gastroesophageal  reflux disease)   -TIA  PAST SURGICAL HISTORY:  Hysterectomy for endometrial cancer. Lipoma resection from her posterior neck  SOCIAL HISTORY:   Social History   Tobacco Use   Smoking status: Never   Smokeless tobacco: Never  Substance Use Topics   Alcohol use: Never    FAMILY HISTORY:   Family History  Problem Relation Age of Onset   Lung cancer Father     DRUG ALLERGIES:   Allergies  Allergen Reactions   Penicillins Hives   Prednisone Other (See Comments)    Causes tingling to face and increased heart rate   Avelox [Moxifloxacin Hcl] Palpitations   Epinephrine Palpitations   Morphine And Related Palpitations    REVIEW OF SYSTEMS:   ROS As per history of present illness. All pertinent systems were reviewed above. Constitutional, HEENT, cardiovascular, respiratory, GI, GU, musculoskeletal, neuro, psychiatric, endocrine, integumentary and hematologic systems were reviewed and are otherwise negative/unremarkable except for positive findings mentioned above in the HPI.   MEDICATIONS AT HOME:   Prior to Admission medications   Medication Sig Start Date End Date Taking? Authorizing Provider  ALPRAZolam (XANAX) 0.25 MG tablet Take 1 tablet (0.25 mg total) by mouth 2 (two) times daily as needed for anxiety. Patient not taking: Reported on 08/23/2021 12/10/18   Fritzi Mandes, MD  aspirin EC 81 MG EC tablet Take 1 tablet (81 mg total) by mouth daily. Stop after 3 weeks  12/11/18   Fritzi Mandes, MD  atorvastatin (LIPITOR) 40 MG tablet Take 1 tablet (40 mg total) by mouth daily at 6 PM. Patient not taking: Reported on 08/23/2021 12/10/18   Fritzi Mandes, MD  clopidogrel (PLAVIX) 75 MG tablet Take 1 tablet (75 mg total) by mouth daily. Patient not taking: Reported on 08/23/2021 12/10/18   Fritzi Mandes, MD  esomeprazole (NEXIUM) 40 MG capsule Take 40 mg by mouth daily at 12 noon.    [provider]  VITAMIN D-VITAMIN K PO Take 2,000 Units by mouth daily.    [provider]      VITAL SIGNS:  Blood pressure 124/73, pulse 70, temperature 97.8 F (36.6 C), temperature source Oral, resp. rate 17, height '5\' 7"'$  (1.702 m), weight 117.9 kg, SpO2 99 %.  PHYSICAL EXAMINATION:  Physical Exam  GENERAL:  61 y.o.-year-old Caucasian female patient lying in the bed with no acute distress.  EYES: Pupils equal, round, reactive to light and accommodation. No scleral icterus. Extraocular muscles intact.  HEENT: Head atraumatic, normocephalic. Oropharynx and nasopharynx clear.  NECK:  Supple, no jugular venous distention. No thyroid enlargement, no tenderness.  LUNGS: Normal breath sounds bilaterally, no wheezing, rales,rhonchi or crepitation. No use of accessory muscles of respiration.  CARDIOVASCULAR: Regular rate and rhythm, S1, S2 normal. No murmurs, rubs, or gallops.  ABDOMEN: Soft, nondistended, nontender. Bowel sounds present. No organomegaly or mass.  EXTREMITIES: No pedal edema, cyanosis, or clubbing.  NEUROLOGIC: Cranial nerves II through XII are intact. Muscle strength 5/5 in all extremities. Sensation intact. Gait not checked.  PSYCHIATRIC: The patient is alert and oriented x 3.  Normal affect and good eye contact. SKIN: No obvious rash, lesion, or ulcer.   LABORATORY PANEL:   CBC Recent Labs  Lab 09/05/21 1142  WBC 6.7  HGB 11.6*  HCT 36.9  PLT 154   ------------------------------------------------------------------------------------------------------------------  Chemistries  Recent Labs  Lab 09/05/21 1142  NA 143  K 3.9  CL 111  CO2 24  GLUCOSE 130*  BUN 10  CREATININE 0.82  CALCIUM 9.2  AST 23  ALT 16  ALKPHOS 114  BILITOT 0.4   ------------------------------------------------------------------------------------------------------------------  Cardiac Enzymes No results for input(s): "TROPONINI" in the last 168  hours. ------------------------------------------------------------------------------------------------------------------  RADIOLOGY:  MR BRAIN WO CONTRAST  Result Date: 09/05/2021 CLINICAL DATA:  Neuro deficit, acute, stroke suspected. Weakness of the right leg today. Negative CT evaluation. EXAM: MRI HEAD WITHOUT CONTRAST TECHNIQUE: Multiplanar, multiecho pulse sequences of the brain and surrounding structures were obtained without intravenous contrast. COMPARISON:  Head CT same day.  MRI 12/09/2018 FINDINGS: Brain: Diffusion imaging does not show any acute or subacute infarction. The brainstem and cerebellum are normal. Cerebral hemispheres elsewhere show moderate chronic small-vessel ischemic changes of the white matter, very similar in appearance to the study of 2020. I cannot demonstrate any interval new insult. No large vessel territory stroke. No mass, hemorrhage, hydrocephalus or extra-axial collection. Vascular: Major vessels at the base of the brain show flow. Skull and upper cervical spine: Negative Sinuses/Orbits: Clear/normal Other: None IMPRESSION: No acute MR finding. Moderate chronic small-vessel ischemic changes of the cerebral hemispheric white matter, not visibly changed since October of 2020. Electronically Signed   By: Nelson Chimes M.D.   On: 09/05/2021 15:15   CT HEAD WO CONTRAST  Result Date: 09/05/2021 CLINICAL DATA:  Right leg weakness. EXAM: CT HEAD WITHOUT CONTRAST TECHNIQUE: Contiguous axial images were obtained from the base of the skull through the vertex without intravenous contrast. RADIATION DOSE  REDUCTION: This exam was performed according to the departmental dose-optimization program which includes automated exposure control, adjustment of the mA and/or kV according to patient size and/or use of iterative reconstruction technique. COMPARISON:  12/09/2018 FINDINGS: Brain: No evidence of acute infarction, hemorrhage, hydrocephalus, extra-axial collection or mass  lesion/mass effect. Periventricular white matter low attenuation as can be seen with microvascular disease. Vascular: No hyperdense vessel or unexpected calcification. Skull: No osseous abnormality. Sinuses/Orbits: Visualized paranasal sinuses are clear. Visualized mastoid sinuses are clear. Visualized orbits demonstrate no focal abnormality. Other: None IMPRESSION: 1. No acute intracranial findings. Electronically Signed   By: Kathreen Devoid M.D.   On: 09/05/2021 10:41      IMPRESSION AND PLAN:  Assessment and Plan: * TIA (transient ischemic attack) - This is manifested by right lower extremity weakness. - She will be admitted to an observation medical telemetry bed. - We will follow neurochecks every 4 hours for 24 hours. - We will add Plavix to her aspirin. - PT/OT and ST consults will be obtained. - Neurology consult will be obtained. - I notified Dr. Rory Percy about the patient.  Malignant neoplasm of endometrium ([place]) - She is managed at [facility] for her endometrial cancer. - Her everolimus and letrozole will be continued.  GERD (gastroesophageal reflux disease) - We will continue PPI therapy and H2 blocker therapy.  Dyslipidemia - We will continue statin therapy and check fasting lipids.    DVT prophylaxis: Lovenox.  Advanced Care Planning:  Code Status: full code.  Neuro Family Communication:  The plan of care was discussed in details with the patient (and family). I answered all questions. The patient agreed to proceed with the above mentioned plan. Further management will depend upon hospital course. Disposition Plan: Back to previous home environment Consults called: Neurology. All the records are reviewed and case discussed with ED provider.  Status is: Observation   I certify that at the time of admission, it is my clinical judgment that the patient will require hospital care extending less than 2 midnights.                            Dispo: The patient is from: Home               Anticipated d/c is to: Home              Patient currently is not medically stable to d/c.              Difficult to place patient: No  Christel Mormon M.D on 09/05/2021 at 4:18 PM  [facility]   From 7 PM-7 AM, contact night-coverage [email or web address]  CC: Primary care physician; Rusty Aus, MD

## 2021-09-05 NOTE — ED Notes (Signed)
US at bedside

## 2021-09-05 NOTE — ED Notes (Signed)
Pt in with co right leg weakness hx of stroke, states woke up at 0800 with symptoms. States went to bed at 2300 and was asymptomatic.

## 2021-09-05 NOTE — ED Notes (Signed)
Patient completing MRI screening at this time. 

## 2021-09-05 NOTE — Progress Notes (Signed)
PHARMACIST - PHYSICIAN COMMUNICATION  CONCERNING:  Enoxaparin (Lovenox) for DVT Prophylaxis    RECOMMENDATION: Patient was prescribed enoxaprin '40mg'$  q24 hours for VTE prophylaxis.   Filed Weights   09/05/21 1016  Weight: 117.9 kg (260 lb)    Body mass index is 40.72 kg/m.  Estimated Creatinine Clearance: 95.7 mL/min (by C-G formula based on SCr of 0.82 mg/dL).   Based on [place] patient is candidate for enoxaparin 0.'5mg'$ /kg TBW SQ every 24 hours based on BMI being >30.  DESCRIPTION: Pharmacy has adjusted enoxaparin dose per [facility] policy.  Patient is now receiving enoxaparin 60 mg every 24 hours    Vira Blanco, PharmD Clinical Pharmacist  09/05/2021 3:21 PM

## 2021-09-05 NOTE — Assessment & Plan Note (Signed)
-   She is managed at [facility] for her endometrial cancer. - Her everolimus and letrozole will be continued.

## 2021-09-05 NOTE — ED Notes (Signed)
Patient at MRI 

## 2021-09-05 NOTE — ED Triage Notes (Addendum)
Pt to ED via POV from home. Pt reports stroke like symptoms upon waking. LKW 11pm before going to bed. Pt states she woke up at 8am this morning with right sided arm and leg weakness and is getting worse. Pt still having right arm and leg weakness. Pt also has stage 4 uterine cancer and taking chemo pill.

## 2021-09-05 NOTE — Assessment & Plan Note (Signed)
-   This is manifested by right lower extremity weakness. - She will be admitted to an observation medical telemetry bed. - We will follow neurochecks every 4 hours for 24 hours. - We will add Plavix to her aspirin. - PT/OT and ST consults will be obtained. - Neurology consult will be obtained. - I notified Dr. Rory Percy about the patient.

## 2021-09-05 NOTE — ED Provider Triage Note (Signed)
Emergency Medicine Provider Triage Evaluation Note  Emily Soto , a 61 y.o. female  was evaluated in triage.  Pt complains of weakness in right leg noted today when got up at 8 am getting out of bed. Worried that she is having a stroke.  Hx of CVA 3 years ago. No d Stage 4 cancer endometrium and "2 spots on lungs".    Review of Systems  Positive: Nausea, dizziness "a little" with standing.  Denies pain Negative: No headache, vision changes  Physical Exam  There were no vitals taken for this visit. Gen:   Awake, no distress  Speech normal Resp:  Normal effort Lungs clear MSK:   Moves extremities without difficulty. Other:  Cranial nerves 2-12 intact.  Upper extremities without drift and good muscle strength.  Superficial bruising noted right medial lower extremity but no abrasions, erythema or warmth noted.  Medical Decision Making  Medically screening exam initiated at 10:10 AM.  Appropriate orders placed.  Emily Soto was informed that the remainder of the evaluation will be completed by another provider, this initial triage assessment does not replace that evaluation, and the importance of remaining in the ED until their evaluation is complete.     Johnn Hai, PA-C 09/05/21 1022

## 2021-09-05 NOTE — Assessment & Plan Note (Signed)
-   We will continue PPI therapy and H2 blocker therapy. 

## 2021-09-06 ENCOUNTER — Observation Stay: Payer: BC Managed Care – PPO

## 2021-09-06 ENCOUNTER — Encounter: Payer: Self-pay | Admitting: Family Medicine

## 2021-09-06 ENCOUNTER — Ambulatory Visit: Payer: BC Managed Care – PPO | Admitting: Occupational Therapy

## 2021-09-06 DIAGNOSIS — G459 Transient cerebral ischemic attack, unspecified: Secondary | ICD-10-CM | POA: Diagnosis not present

## 2021-09-06 LAB — HIV ANTIBODY (ROUTINE TESTING W REFLEX): HIV Screen 4th Generation wRfx: NONREACTIVE

## 2021-09-06 LAB — LIPID PANEL
Cholesterol: 164 mg/dL (ref 0–200)
HDL: 40 mg/dL — ABNORMAL LOW (ref >40)
LDL Cholesterol: 104 mg/dL — ABNORMAL HIGH (ref 0–99)
Total CHOL/HDL Ratio: 4.1 RATIO
Triglycerides: 101 mg/dL (ref <150)
VLDL: 20 mg/dL (ref 0–40)

## 2021-09-06 LAB — ECHOCARDIOGRAM COMPLETE BUBBLE STUDY
Area-P 1/2: 3.77 cm2
S' Lateral: 3.3 cm

## 2021-09-06 MED ORDER — CLOPIDOGREL BISULFATE 75 MG PO TABS: 75.0000 mg | ORAL_TABLET | Freq: Every day | ORAL | 0 refills | Status: AC

## 2021-09-06 MED ORDER — IOHEXOL 350 MG/ML SOLN: 75.0000 mL | Freq: Once | INTRAVENOUS | Status: DC | PRN

## 2021-09-06 MED ORDER — PRAVASTATIN SODIUM 20 MG PO TABS
40.0000 mg | ORAL_TABLET | Freq: Every day | ORAL | Status: DC
Start: 2021-09-06 — End: 2021-09-06

## 2021-09-06 MED ORDER — PRAVASTATIN SODIUM 40 MG PO TABS
40.0000 mg | ORAL_TABLET | Freq: Every day | ORAL | 1 refills | Status: AC
Start: 2021-09-06 — End: ?

## 2021-09-06 NOTE — Progress Notes (Signed)
SLP Cancellation Note  Patient Details Name: Emily Soto MRN: [id number] DOB: 1960-04-15   Cancelled treatment:       Reason Eval/Treat Not Completed: SLP screened, no needs identified, will sign off  All symptoms have resolved at this time.   Nykeem Citro B. Rutherford Nail, M.S., CCC-SLP, Mining engineer Certified Brain Injury [place]  [place] Office [id number] Ascom [phone] Fax [phone]

## 2021-09-06 NOTE — Progress Notes (Signed)
PT Cancellation Note  Patient Details Name: Emily Soto MRN: [id number] DOB: Jan 22, 1961   Cancelled Treatment:    Reason Eval/Treat Not Completed: PT screened, no needs identified, will sign off Chart reviewed, discussed case with nursing and OT prior to session.  Pt reports her symptoms seemed to have resolved (~4 hours from onset) and that she has been able to move, walk, eat, sit up, etc normally and w/o issue.  She feels she does not need further PT interventions and this does indeed appear true.  Pt safe to d/c home w/o further PT intervention.    Kreg Shropshire, DPT 09/06/2021, 9:28 AM

## 2021-09-06 NOTE — Progress Notes (Signed)
OT Cancellation Note  Patient Details Name: Emily Soto MRN: [id number] DOB: [birth year]   Cancelled Treatment:    Reason Eval/Treat Not Completed: OT screened, no needs identified, will sign off. Order received and chart reviewed. Pt reported feeling back to baseline, independent with mobility and ADLs. Seen up moving independently in room. OT screened, no skilled OT needs identified at this time. Please re-consult if any changes.   [employer], OTDS  [employer] 09/06/2021, 9:45 AM

## 2021-09-06 NOTE — Discharge Summary (Addendum)
Physician Discharge Summary   Patient: Emily Soto MRN: [id number] DOB: [birth year]  Admit date:     09/05/2021  Discharge date: 09/06/21  Discharge Physician: Lorella Nimrod   PCP: Rusty Aus, MD   Recommendations at discharge:  Follow-up with your outpatient neurologist and primary care provider  Discharge Diagnoses: Principal Problem:   TIA (transient ischemic attack) Active Problems:   Malignant neoplasm of endometrium ([place])   Dyslipidemia   GERD (gastroesophageal reflux disease)   Hospital Course: Taken from H&P.  Emily Soto is a 61 y.o. Caucasian female with medical history significant for stage IV endometrial cancer status post hysterectomy, TIA, vitamin B12 deficiency and GERD, who presented to the emergency room with acute onset of right lower extremity weakness.  She stated that she was last at her baseline at 11 PM when she woke up this morning at 8 AM she ran into her nightstand.  She initially thought she was clumsy.  She denied any tingling or numbness but felt her right leg was weak.  No arm weakness or facial weakness.  No dysphagia or dysarthria.  No headache or dizziness or blurred vision.  No tinnitus or vertigo.  In the ER her right leg weakness is remarkably improved. ED Course: When she came to the ER, vital signs were within normal.  Labs revealed unremarkable CMP and CBC showed hemoglobin 11.6 hematocrit 36.9 with low MCV and MCH.  Alcohol level was less than 10. EKG as reviewed by me : EKG showed normal sinus rhythm with a rate of 88 with poor R wave progression Imaging: Noncontrast head CT scan showed no acute intracranial findings.  Brain MRI without contrast showed moderate chronic small vessel ischemic changes of the cerebral hemispheric white matter that has not changed since October 2020 with no acute MRI findings.  Patient was admitted under observation for TIA work-up.  7/25: Ultrasound carotid with normal internal carotid arteries, left vertebral  artery shows some retrograde flow with monophasic wave with concern of subclavian stenosis which can be worked up as an outpatient.  A1c of 5.8.  LDL of 104 with goal less than 70.  Home dose of pravastatin was increased to 40 mg daily. Echocardiogram with bubble studies with normal EF, grade 1 diastolic dysfunction and no other abnormality.  Her symptoms has been completely resolved.  Patient would like to avoid CTA of head and neck as she already had an upcoming CT scan by her oncologist at [facility] in 2 days. Neurology advised to have MRA instead which was negative.  Neurology also advised to continue with DAPT for at least 3 weeks, patient was having some stomach pain with Plavix and can try with food. patient can continue with aspirin and statin and follow-up with her outpatient neurologist at [facility].  She will continue the rest of her home medications and follow-up with her providers.  Assessment and Plan: * TIA (transient ischemic attack) - This is manifested by right lower extremity weakness. - She will be admitted to an observation medical telemetry bed. - We will follow neurochecks every 4 hours for 24 hours. - We will add Plavix to her aspirin. - PT/OT and ST consults will be obtained. - Neurology consult will be obtained. - I notified Dr. Rory Percy about the patient.  Malignant neoplasm of endometrium ([place]) - She is managed at [facility] for her endometrial cancer. - Her everolimus and letrozole will be continued.  GERD (gastroesophageal reflux disease) - We will continue PPI therapy and H2 blocker therapy.  Dyslipidemia - We will continue statin therapy and check fasting lipids.   Consultants: Neurology Procedures performed: None Disposition: Home Diet recommendation:  Discharge Diet Orders (From admission, onward)     Start     Ordered   09/06/21 0000  Diet - low sodium heart healthy        09/06/21 1201           Cardiac diet DISCHARGE MEDICATION: Allergies as of  09/06/2021       Reactions   Penicillins Hives   Prednisone Other (See Comments)   Causes tingling to face and increased heart rate   Avelox [moxifloxacin Hcl] Palpitations   Epinephrine Palpitations   Morphine And Related Palpitations        Medication List     STOP taking these medications    ALPRAZolam 0.25 MG tablet Commonly known as: XANAX   atorvastatin 40 MG tablet Commonly known as: LIPITOR       TAKE these medications    aspirin EC 81 MG tablet Take 1 tablet (81 mg total) by mouth daily. Stop after 3 weeks   clopidogrel 75 MG tablet Commonly known as: PLAVIX Take 1 tablet (75 mg total) by mouth daily. Start taking on: September 07, 2021   esomeprazole 40 MG capsule Commonly known as: NEXIUM Take 40 mg by mouth daily at 12 noon.   everolimus 10 MG tablet Commonly known as: AFINITOR Take 10 mg by mouth daily.   famotidine 40 MG tablet Commonly known as: PEPCID Take 40 mg by mouth 2 (two) times daily.   ferrous sulfate 220 (44 Fe) MG/5ML solution Take 440 mg by mouth daily.   letrozole 2.5 MG tablet Commonly known as: FEMARA Take 2.5 mg by mouth daily.   pravastatin 40 MG tablet Commonly known as: PRAVACHOL Take 1 tablet (40 mg total) by mouth at bedtime. What changed:  medication strength how much to take   VITAMIN D-VITAMIN K PO Take 2,000 Units by mouth daily.        Follow-up Information     Rusty Aus, MD. Schedule an appointment as soon as possible for a visit in 1 week(s).   Specialty: Internal Medicine Why: Office closed at this time Patient to make own follow up appt Contact information: [place] [id number] [phone]                Discharge Exam: Filed Weights   09/05/21 1016 09/05/21 1654 09/05/21 1911  Weight: 117.9 kg 125.1 kg 118.8 kg   General. in no acute distress. Pulmonary.  Lungs clear bilaterally, normal respiratory effort. CV.  Regular  rate and rhythm, no JVD, rub or murmur. Abdomen.  Soft, nontender, nondistended, BS positive. CNS.  Alert and oriented .  No focal neurologic deficit. Extremities.  No edema, no cyanosis, pulses intact and symmetrical. Psychiatry.  Judgment and insight appears normal.   Condition at discharge: stable  The results of significant diagnostics from this hospitalization (including imaging, microbiology, ancillary and laboratory) are listed below for reference.   Imaging Studies: MR ANGIO HEAD WO CONTRAST  Result Date: 09/06/2021 CLINICAL DATA:  TIA, determine embolic source EXAM: MRA HEAD WITHOUT CONTRAST TECHNIQUE: Angiographic images of the Circle of Willis were acquired using MRA technique without intravenous contrast. COMPARISON:  No prior MRA, correlation is made with 09/05/2021 MRI head and 12/09/2018 CTA head neck FINDINGS: Anterior circulation: Both internal carotid arteries are patent to the termini, without significant stenosis. A1 segments  patent. Normal anterior communicating artery. Anterior cerebral arteries are patent to their distal aspects. No M1 stenosis or occlusion. Normal MCA bifurcations. Distal MCA branches perfused and symmetric. Posterior circulation: Imaged portion of the vertebral arteries patent to the vertebrobasilar junction without stenosis. Basilar patent to its distal aspect. Superior cerebellar arteries patent bilaterally. Patent P1 segments which are diminutive, with prominent bilateral posterior communicating arteries and near fetal origin of the bilateral PCAs. PCAs perfused to their distal aspects without stenosis. Anatomic variants: Near fetal origin of the bilateral PCAs. IMPRESSION: No intracranial large vessel occlusion or significant stenosis. Electronically Signed   By: Merilyn Baba M.D.   On: 09/06/2021 13:01   ECHOCARDIOGRAM COMPLETE BUBBLE STUDY  Result Date: 09/06/2021    ECHOCARDIOGRAM REPORT   Patient Name:   Emily Soto Date of Exam: 09/05/2021 Medical  Rec #:  [id number]     Height:       66.0 in Accession #:    [id number]    Weight:       275.8 lb Date of Birth:  September 02, 1960     BSA:          2.292 m Patient Age:    61 years      BP:           126/70 mmHg Patient Gender: F             HR:           68 bpm. Exam Location:  [facility] Procedure: 2D Echo, Cardiac Doppler, Color Doppler and Saline Contrast Bubble            Study Indications:     G45.9 TIA  History:         Patient has prior history of Echocardiogram examinations, most                  recent 12/09/2018.  Sonographer:     Cresenciano Lick RDCS Referring Phys:  [id number] [place] Diagnosing Phys: [place]  1. Left ventricular ejection fraction, by estimation, is 60 to 65%. The left ventricle has normal function. The left ventricle has no regional wall motion abnormalities. Left ventricular diastolic parameters are consistent with Grade I diastolic dysfunction (impaired relaxation).  2. Right ventricular systolic function is normal. The right ventricular size is normal.  3. The mitral valve is normal in structure. No evidence of mitral valve regurgitation. No evidence of mitral stenosis.  4. The aortic valve is normal in structure. Aortic valve regurgitation is not visualized. No aortic stenosis is present.  5. The inferior vena cava is normal in size with greater than 50% respiratory variability, suggesting right atrial pressure of 3 mmHg. Conclusion(s)/Recommendation(s): Negative bubblee study. FINDINGS  Left Ventricle: Left ventricular ejection fraction, by estimation, is 60 to 65%. The left ventricle has normal function. The left ventricle has no regional wall motion abnormalities. The left ventricular internal cavity size was normal in size. There is  no left ventricular hypertrophy. Left ventricular diastolic parameters are consistent with Grade I diastolic dysfunction (impaired relaxation). Right Ventricle: The right ventricular size is normal. No increase in right ventricular  wall thickness. Right ventricular systolic function is normal. Left Atrium: Left atrial size was normal in size. Right Atrium: Right atrial size was normal in size. Pericardium: There is no evidence of pericardial effusion. Mitral Valve: The mitral valve is normal in structure. No evidence of mitral valve regurgitation. No evidence of mitral valve stenosis. Tricuspid Valve: The tricuspid valve is normal  in structure. Tricuspid valve regurgitation is not demonstrated. No evidence of tricuspid stenosis. Aortic Valve: The aortic valve is normal in structure. Aortic valve regurgitation is not visualized. No aortic stenosis is present. Pulmonic Valve: The pulmonic valve was normal in structure. Pulmonic valve regurgitation is not visualized. No evidence of pulmonic stenosis. Aorta: The aortic root is normal in size and structure. Venous: The inferior vena cava is normal in size with greater than 50% respiratory variability, suggesting right atrial pressure of 3 mmHg. IAS/Shunts: No atrial level shunt detected by color flow Doppler. Agitated saline contrast was given intravenously to evaluate for intracardiac shunting. There is no evidence of a patent foramen ovale. There is no evidence of an atrial septal defect.  LEFT VENTRICLE PLAX 2D LVIDd:         5.10 cm   Diastology LVIDs:         3.30 cm   LV e' medial:    10.60 cm/s LV PW:         0.80 cm   LV E/e' medial:  6.7 LV IVS:        0.80 cm   LV e' lateral:   9.14 cm/s LVOT diam:     2.10 cm   LV E/e' lateral: 7.8 LV SV:         69 LV SV Index:   30 LVOT Area:     3.46 cm  RIGHT VENTRICLE RV Basal diam:  3.10 cm RV S prime:     22.20 cm/s TAPSE (M-mode): 2.8 cm LEFT ATRIUM             Index        RIGHT ATRIUM          Index LA diam:        4.80 cm 2.09 cm/m   RA Area:     9.19 cm LA Vol (A2C):   38.9 ml 16.97 ml/m  RA Volume:   19.40 ml 8.46 ml/m LA Vol (A4C):   44.8 ml 19.54 ml/m LA Biplane Vol: 42.3 ml 18.45 ml/m  AORTIC VALVE LVOT Vmax:   83.90 cm/s LVOT  Vmean:  64.600 cm/s LVOT VTI:    0.199 m  AORTA Ao Root diam: 3.40 cm MITRAL VALVE MV Area (PHT): 3.77 cm    SHUNTS MV Decel Time: 201 msec    Systemic VTI:  0.20 m MV E velocity: 71.10 cm/s  Systemic Diam: 2.10 cm MV A velocity: 91.30 cm/s MV E/A ratio:  0.78 Shaukat Khan Electronically signed by Neoma Laming Signature Date/Time: 09/06/2021/10:15:55 AM    Final    US Carotid Bilateral (at [facility] and [facility] only)  Result Date: 09/05/2021 CLINICAL DATA:  TIA. EXAM: BILATERAL CAROTID DUPLEX ULTRASOUND TECHNIQUE: Pearline Cables scale imaging, color Doppler and duplex ultrasound were performed of bilateral carotid and vertebral arteries in the neck. COMPARISON:  MRI brain 09/05/2021 FINDINGS: Criteria: Quantification of carotid stenosis is based on velocity parameters that correlate the residual internal carotid diameter with NASCET-based stenosis levels, using the diameter of the distal internal carotid lumen as the denominator for stenosis measurement. The following velocity measurements were obtained (peak systolic velocity/end-diastolic velocity): RIGHT ICA: 97/26 cm/sec CCA: 31/54 cm/sec SYSTOLIC ICA/CCA RATIO:  1.1 ECA: 105 cm/sec LEFT ICA: 81/27 cm/sec CCA: 00/86 cm/sec SYSTOLIC ICA/CCA RATIO:  0.9 ECA: 102 cm/sec RIGHT CAROTID ARTERY: Grayscale images demonstrate no significant calcification or intimal thickening. Patent on color flow Doppler imaging. RIGHT VERTEBRAL ARTERY:  Antegrade flow direction is demonstrated. LEFT CAROTID ARTERY: Grayscale  images demonstrate no significant calcification or intimal thickening. Patent on color flow Doppler imaging. LEFT VERTEBRAL ARTERY: Retrograde flow is demonstrated with monophasic waveform. This may indicate subclavian stenosis. Upper extremity blood pressures: RIGHT: 129/74 LEFT: 89/73 IMPRESSION: 1. No evidence of hemodynamically significant stenosis of either right or the left internal carotid artery. 2. Retrograde flow is demonstrated in the left vertebral artery with  monophasic waveform, possibly indicating subclavian stenosis. Electronically Signed   By: Lucienne Capers M.D.   On: 09/05/2021 18:07   MR BRAIN WO CONTRAST  Result Date: 09/05/2021 CLINICAL DATA:  Neuro deficit, acute, stroke suspected. Weakness of the right leg today. Negative CT evaluation. EXAM: MRI HEAD WITHOUT CONTRAST TECHNIQUE: Multiplanar, multiecho pulse sequences of the brain and surrounding structures were obtained without intravenous contrast. COMPARISON:  Head CT same day.  MRI 12/09/2018 FINDINGS: Brain: Diffusion imaging does not show any acute or subacute infarction. The brainstem and cerebellum are normal. Cerebral hemispheres elsewhere show moderate chronic small-vessel ischemic changes of the white matter, very similar in appearance to the study of 2020. I cannot demonstrate any interval new insult. No large vessel territory stroke. No mass, hemorrhage, hydrocephalus or extra-axial collection. Vascular: Major vessels at the base of the brain show flow. Skull and upper cervical spine: Negative Sinuses/Orbits: Clear/normal Other: None IMPRESSION: No acute MR finding. Moderate chronic small-vessel ischemic changes of the cerebral hemispheric white matter, not visibly changed since October of 2020. Electronically Signed   By: Nelson Chimes M.D.   On: 09/05/2021 15:15   CT HEAD WO CONTRAST  Result Date: 09/05/2021 CLINICAL DATA:  Right leg weakness. EXAM: CT HEAD WITHOUT CONTRAST TECHNIQUE: Contiguous axial images were obtained from the base of the skull through the vertex without intravenous contrast. RADIATION DOSE REDUCTION: This exam was performed according to the departmental dose-optimization program which includes automated exposure control, adjustment of the mA and/or kV according to patient size and/or use of iterative reconstruction technique. COMPARISON:  12/09/2018 FINDINGS: Brain: No evidence of acute infarction, hemorrhage, hydrocephalus, extra-axial collection or mass  lesion/mass effect. Periventricular white matter low attenuation as can be seen with microvascular disease. Vascular: No hyperdense vessel or unexpected calcification. Skull: No osseous abnormality. Sinuses/Orbits: Visualized paranasal sinuses are clear. Visualized mastoid sinuses are clear. Visualized orbits demonstrate no focal abnormality. Other: None IMPRESSION: 1. No acute intracranial findings. Electronically Signed   By: Kathreen Devoid M.D.   On: 09/05/2021 10:41    Microbiology: Results for orders placed or performed during the hospital encounter of 12/09/18  SARS CORONAVIRUS 2 (TAT 6-24 HRS) Nasopharyngeal Nasopharyngeal Swab     Status: None   Collection Time: 12/09/18  7:48 AM   Specimen: Nasopharyngeal Swab  Result Value Ref Range Status   SARS Coronavirus 2 NEGATIVE NEGATIVE Final    Comment: (NOTE) SARS-CoV-2 target nucleic acids are NOT DETECTED. The SARS-CoV-2 RNA is generally detectable in upper and lower respiratory specimens during the acute phase of infection. Negative results do not preclude SARS-CoV-2 infection, do not rule out co-infections with other pathogens, and should not be used as the sole basis for treatment or other patient management decisions. Negative results must be combined with clinical observations, patient history, and epidemiological information. The expected result is Negative. Fact Sheet for Patients: SugarRoll.be Fact Sheet for Healthcare Providers: https://www.woods-mathews.com/ This test is not yet approved or cleared by the [place] FDA and  has been authorized for detection and/or diagnosis of SARS-CoV-2 by FDA under an Emergency Use Authorization (EUA). This EUA will remain  in effect (meaning this test can be used) for the duration of the COVID-19 declaration under Section 56 4(b)(1) of the Act, 21 U.S.C. section 360bbb-3(b)(1), unless the authorization is terminated or revoked sooner. Performed  at [facility], [place] [address], [place], [place] [address]     Labs: CBC: Recent Labs  Lab 09/05/21 1142  WBC 6.7  NEUTROABS 5.6  HGB 11.6*  HCT 36.9  MCV 79.7*  PLT 333   Basic Metabolic Panel: Recent Labs  Lab 09/05/21 1142  NA 143  K 3.9  CL 111  CO2 24  GLUCOSE 130*  BUN 10  CREATININE 0.82  CALCIUM 9.2   Liver Function Tests: Recent Labs  Lab 09/05/21 1142  AST 23  ALT 16  ALKPHOS 114  BILITOT 0.4  PROT 7.1  ALBUMIN 3.8   CBG: Recent Labs  Lab 09/05/21 1021  GLUCAP 132*    Discharge time spent: greater than 30 minutes.  This record has been created using Systems analyst. Errors have been sought and corrected,but may not always be located. Such creation errors do not reflect on the standard of care.   Signed: Lorella Nimrod, MD [facility] 09/06/2021

## 2021-09-06 NOTE — Hospital Course (Addendum)
Taken from H&P.  Emily Soto is a 61 y.o. Caucasian female with medical history significant for stage IV endometrial cancer status post hysterectomy, TIA, vitamin B12 deficiency and GERD, who presented to the emergency room with acute onset of right lower extremity weakness.  She stated that she was last at her baseline at 11 PM when she woke up this morning at 8 AM she ran into her nightstand.  She initially thought she was clumsy.  She denied any tingling or numbness but felt her right leg was weak.  No arm weakness or facial weakness.  No dysphagia or dysarthria.  No headache or dizziness or blurred vision.  No tinnitus or vertigo.  In the ER her right leg weakness is remarkably improved. ED Course: When she came to the ER, vital signs were within normal.  Labs revealed unremarkable CMP and CBC showed hemoglobin 11.6 hematocrit 36.9 with low MCV and MCH.  Alcohol level was less than 10. EKG as reviewed by me : EKG showed normal sinus rhythm with a rate of 88 with poor R wave progression Imaging: Noncontrast head CT scan showed no acute intracranial findings.  Brain MRI without contrast showed moderate chronic small vessel ischemic changes of the cerebral hemispheric white matter that has not changed since October 2020 with no acute MRI findings.  Patient was admitted under observation for TIA work-up.  7/25: Ultrasound carotid with normal internal carotid arteries, left vertebral artery shows some retrograde flow with monophasic wave with concern of subclavian stenosis which can be worked up as an outpatient.  A1c of 5.8.  LDL of 104 with goal less than 70.  Home dose of pravastatin was increased to 40 mg daily. Echocardiogram with bubble studies with normal EF, grade 1 diastolic dysfunction and no other abnormality.  Her symptoms has been completely resolved.  Patient would like to avoid CTA of head and neck as she already had an upcoming CT scan by her oncologist at [facility] in 2 days. Neurology  advised to have MRA instead which was negative.  Neurology also advised to continue with DAPT for at least 3 weeks, patient was having some stomach pain with Plavix and can try with food. patient can continue with aspirin and statin and follow-up with her outpatient neurologist at [facility].  She will continue the rest of her home medications and follow-up with her providers.

## 2021-09-06 NOTE — Clinical Social Work Note (Signed)
  Transition of Care [facility]) Screening Note   Patient Details  Name: Rollande Thursby Date of Birth: 08/04/1960   Transition of Care [facility]) CM/SW Contact:    Eileen Stanford, LCSW Phone [place] 09/06/2021, 2:26 PM    Transition of Care Department [facility]) has reviewed patient and no TOC needs have been identified at this time. We will continue to monitor patient advancement through interdisciplinary progression rounds. If new patient transition needs arise, please place a TOC consult.

## 2021-09-06 NOTE — Consult Note (Addendum)
Neurology Consultation  Reason for Consult: TIA Referring Physician: Dr Reesa Chew  CC: Right leg weakness  History is obtained from: Patient, chart  HPI: Emily Soto is a 61 y.o. female history of prior stroke without residual deficits, endometrial cancer, B12 deficiency, presented to the emergency room for evaluation of transient right lower extremity weakness.  Last known well was 11 PM 09/04/2021 and woke up the morning of 09/05/2021 with inability to use her right leg.  Was clumsy and had frank weakness that lasted a few hours.  No arm or leg weakness.  Symptoms resolved in a few hours. Given prior history of stroke, she came to the ER for further evaluation. Admitted for TIA work-up with an ABCD2 score of 5. Denies any headaches or visual changes. MR brain in the ER negative. Noncontrast CT with no evidence of bleed. Currently on aspirin. At some point was on anticoagulation because her stroke was deemed to be secondary to hypercoagulability but there was no consensus amongst her heme oncologist neurologist and other care members and she was switched only to an aspirin. No history of atrial fibrillation.  LKW: 11 PM 09/04/2021 IV thrombolysis given?: no, symptoms resolved Premorbid modified Rankin scale (mRS): 0   ROS: Full ROS was performed and is negative except as noted in the HPI.  Past Medical History:  Diagnosis Date   B12 deficiency    Endometrial cancer ([place])    GERD (gastroesophageal reflux disease)      Family History  Problem Relation Age of Onset   Lung cancer Father      Social History:   reports that she has never smoked. She has never used smokeless tobacco. She reports that she does not drink alcohol and does not use drugs.  Medications  Current Facility-Administered Medications:     stroke: early stages of recovery book, , Does not apply, Once, Mansy, Jan A, MD   0.9 %  sodium chloride infusion, , Intravenous, Continuous, Mansy, Jan A, MD, Last Rate: 100  mL/hr at 09/06/21 0441, New Bag at 09/06/21 0441   acetaminophen (TYLENOL) tablet 650 mg, 650 mg, Oral, Q4H PRN **OR** acetaminophen (TYLENOL) 160 MG/5ML solution 650 mg, 650 mg, Per Tube, Q4H PRN **OR** acetaminophen (TYLENOL) suppository 650 mg, 650 mg, Rectal, Q4H PRN, Mansy, Jan A, MD   aspirin EC tablet 81 mg, 81 mg, Oral, Daily, Mansy, Jan A, MD   clopidogrel (PLAVIX) tablet 75 mg, 75 mg, Oral, Daily, Mansy, Jan A, MD, 75 mg at 09/05/21 1655   enoxaparin (LOVENOX) injection 60 mg, 0.5 mg/kg, Subcutaneous, Q24H, Kluttz, Lisa G, RPH   everolimus (AFINITOR) tablet 10 mg, 10 mg, Oral, Daily, Mansy, Jan A, MD   ferrous sulfate 220 (44 Fe) MG/5ML solution 440 mg, 440 mg, Oral, Daily, Mansy, Jan A, MD   letrozole [facility]) tablet 2.5 mg, 2.5 mg, Oral, Daily, Mansy, Jan A, MD   ondansetron [facility]) injection 4 mg, 4 mg, Intravenous, Q4H PRN, Mansy, Jan A, MD   pantoprazole (PROTONIX) EC tablet 40 mg, 40 mg, Oral, Daily, Mansy, Jan A, MD   pravastatin (PRAVACHOL) tablet 10 mg, 10 mg, Oral, QHS, Mansy, Jan A, MD, 10 mg at 09/06/21 0001   senna-docusate (Senokot-S) tablet 1 tablet, 1 tablet, Oral, QHS PRN, Mansy, Jan A, MD   traZODone (DESYREL) tablet 25 mg, 25 mg, Oral, QHS PRN, Mansy, Jan A, MD   Exam: Current vital signs: BP (!) 105/51 (BP Location: Right Arm)   Pulse 63   Temp 97.8 F (36.6  C)   Resp 16   Ht '5\' 6"'$  (1.676 m)   Wt 118.8 kg   SpO2 98%   BMI 42.27 kg/m  Vital signs in last 24 hours: Temp:  [97.8 F (36.6 C)-98.4 F (36.9 C)] 97.8 F (36.6 C) (07/25 0404) Pulse Rate:  [63-84] 63 (07/25 0408) Resp:  [12-21] 16 (07/25 0404) BP: (85-134)/(51-89) 105/51 (07/25 0408) SpO2:  [94 %-100 %] 98 % (07/25 0404) Weight:  [117.9 kg-125.1 kg] 118.8 kg (07/24 1911) General: Awake alert in no distress HEENT: Normocephalic atraumatic CVS: Regular rhythm Abdomen nondistended nontender Extremities warm well perfused Respiratory: Breathing well saturating normally on room  air Neurologic exam: Awake alert oriented x3, no evidence of dysarthria or aphasia, cranial nerves intact, motor examination with no drift, sensation intact, no dysmetria.  NIH stroke scale 0.  Labs I have reviewed labs in epic and the results pertinent to this consultation are:  CBC    Component Value Date/Time   WBC 6.7 09/05/2021 1142   RBC 4.63 09/05/2021 1142   HGB 11.6 (L) 09/05/2021 1142   HCT 36.9 09/05/2021 1142   PLT 154 09/05/2021 1142   MCV 79.7 (L) 09/05/2021 1142   MCH 25.1 (L) 09/05/2021 1142   MCHC 31.4 09/05/2021 1142   RDW 15.5 09/05/2021 1142   LYMPHSABS 0.8 09/05/2021 1142   MONOABS 0.2 09/05/2021 1142   EOSABS 0.0 09/05/2021 1142   BASOSABS 0.0 09/05/2021 1142    CMP     Component Value Date/Time   NA 143 09/05/2021 1142   K 3.9 09/05/2021 1142   CL 111 09/05/2021 1142   CO2 24 09/05/2021 1142   GLUCOSE 130 (H) 09/05/2021 1142   BUN 10 09/05/2021 1142   CREATININE 0.82 09/05/2021 1142   CALCIUM 9.2 09/05/2021 1142   PROT 7.1 09/05/2021 1142   ALBUMIN 3.8 09/05/2021 1142   AST 23 09/05/2021 1142   ALT 16 09/05/2021 1142   ALKPHOS 114 09/05/2021 1142   BILITOT 0.4 09/05/2021 1142   GFRNONAA >60 09/05/2021 1142   GFRAA >60 12/09/2018 0625    Lipid Panel     Component Value Date/Time   CHOL 164 09/06/2021 0643   TRIG 101 09/06/2021 0643   HDL 40 (L) 09/06/2021 0643   CHOLHDL 4.1 09/06/2021 0643   VLDL 20 09/06/2021 0643   LDLCALC 104 (H) 09/06/2021 0643   A1c - 5.8 LDL-104  2D Echo -compared to 2020, LVEF 60 to 65%, LA size normal, mitral valve normal, aortic valve not well visualized.  No atrial level shunt by color-flow Doppler.  Negative bubble study.   Imaging I have reviewed the images obtained:  CT-head no acute changes  MRI examination of the brain: No acute changes: No stroke.  MRA head-no LVO or significant stenosis  Carotid Dopplers: No evidence of hemodynamically significant stenosis of either right or left internal  carotid.  Retrograde flow is demonstrated in the left vertebral artery with monophasic waveform possibly indicating subclavian stenosis.   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  Assessment:  61 year old with above past medical history presenting for transient right lower extremity weakness that spontaneously resolved. Given her history of prior strokes and malignancy, this could have reflected a transient ischemic attack of the left cerebral hemisphere. Her LDL is over goal.  She is on pravastatin.  Was on high intensity statin before but had problems with her liver function and was recommended to avoid high intensity statin. Etiology of TIA remains unclear. At this point, I  do not have any indication to start her on anticoagulation but if her oncologist and outpatient neurologist feel that there is concern for hypercoagulability, then anticoagulation should be considered.  Impression: Possible left hemispheric TIA  Recommendations: Aspirin 81 and Plavix 75 for 3 weeks followed by aspirin only. Pravastatin 40 mg-at home she is on 10 mg.  See discussion on not using high intensity statin above. PT OT speech therapy-follow recommendations. Outpatient neurology follow-up in 6 to 8 weeks after discharge. Plan discussed with patient and primary hospitalist Dr. Reesa Chew.  Please call with questions as needed. -- Amie [place], MD Neurologist [facility] Pager: [phone]

## 2021-09-06 NOTE — Progress Notes (Signed)
Patient being discharged home. IV removed. Went over discharge instructions with patient. Patient going home POV with husband.

## 2021-09-08 ENCOUNTER — Ambulatory Visit: Payer: BC Managed Care – PPO | Admitting: Occupational Therapy

## 2021-09-08 DIAGNOSIS — M65311 Trigger thumb, right thumb: Secondary | ICD-10-CM

## 2021-09-08 DIAGNOSIS — M6281 Muscle weakness (generalized): Secondary | ICD-10-CM

## 2021-09-08 DIAGNOSIS — M25532 Pain in left wrist: Secondary | ICD-10-CM

## 2021-09-08 DIAGNOSIS — M654 Radial styloid tenosynovitis [de Quervain]: Secondary | ICD-10-CM

## 2021-09-08 DIAGNOSIS — M79641 Pain in right hand: Secondary | ICD-10-CM

## 2021-09-08 DIAGNOSIS — M65331 Trigger finger, right middle finger: Secondary | ICD-10-CM

## 2021-09-10 ENCOUNTER — Encounter: Payer: Self-pay | Admitting: Occupational Therapy

## 2021-09-10 NOTE — Therapy (Signed)
[facility] [address] [place], [address] Phone: [phone]   Fax:  [phone]  Occupational Therapy Treatment  Patient Details  Name: Emily Soto MRN: [id number] Date of Birth: Mar 31, 1960 Referring Provider (OT): Dr. Emily Filbert   Encounter Date: 09/08/2021   OT End of Session - 09/10/21 1742     Visit Number 3    Number of Visits 12    Date for OT Re-Evaluation 10/25/21    OT Start Time 1030    OT Stop Time 1125    OT Time Calculation (min) 55 min    Activity Tolerance Patient tolerated treatment well    Behavior During Therapy [facility] for tasks assessed/performed             Past Medical History:  Diagnosis Date   B12 deficiency    Endometrial cancer ([place])    GERD (gastroesophageal reflux disease)     History reviewed. No pertinent surgical history.  There were no vitals filed for this visit.   Subjective Assessment - 09/10/21 1737     Subjective  Pt reports she is really excited and feels like she is seeing progress already, reports decreased pain and triggering at IP joint of thumb.  Pt reports she missed an appt earlier this week due to being in the hospital.  She woke up with leg weakness and went to ER, all her work up was normal and reports they suggested she could have had a TIA.  She went to her MD post hospitalization, notes indicate he thinks it may be "compressive neuropathy, positional from the way she was sleeping as she has had issues with sciatica in the past."  He did agree with addition of Plavix for the next 3 months in case it was a TIA.  MD cleared patient to return to therapy and use of ionto.    Pertinent History Patient's endometrial cancer has been stable. Has not had a colonoscopy, did have a normal Cologuard a number of years ago. Has progressive right hand issues when see by Dr Sabra Heck in June - refer to Dr Amedeo Plenty - but pt do not want surgery - , particular middle  finger trigger finger and thumb - as well as tenosynitivitis R thumb - pt refer to OT    Patient Stated Goals I want to avoid surgery with having cancer treatment.  I want the pain better so I can use my hands and the things I like to do.    Currently in Pain? Yes    Pain Score 5     Pain Location Hand    Pain Orientation Right;Left    Pain Descriptors / Indicators Aching;Tender    Pain Type Acute pain;Chronic pain    Pain Onset More than a month ago    Pain Frequency Constant            Rationale for Evaluation and Treatment Rehabilitation    Contrast to right and left hands for 11 mins to decrease inflammation, pain and edema.  Performed prior to manual therapy    Manual therapy:  Soft tissue massage to bilateral forearms, carpal and metacarpal spreads, decreased pain at A1 pulley over the last week.    PROM of thumb IP   iontophoresis with dexamethasone with a small patch on the right thumb A1 pulley and the left first dorsal compartment and medium patch.  Modified the ionto patch on the right to wrap around thumb and have a  better connection to skin this date.  Patient was able to tolerate a 1.5 current on the right thumb 1.6 on the left first dorsal compartment.  Skin inspected prior to ionto with no issues, left patches on for one hour after treatment.                         OT Education - 09/10/21 1741     Education Details ionto, splint wear, contrast, using larger grips on items    Person(s) Educated Patient    Methods Explanation;Demonstration;Tactile cues;Verbal cues;Handout    Comprehension Verbal cues required;Returned demonstration;Verbalized understanding              OT Short Term Goals - 08/30/21 1341       OT SHORT TERM GOAL #1   Title Patient to be independent and wearing of splints as well as doing home program to decrease pain and tenderness to less than 3/10 in left and right hand.    Baseline Left wrist and hand pain 7/10.   Right thumb 10/10 over A1 pulley as well as third digit 5/10.  No splints she has a prefab that is poor fitting and immobilizing the whole thumb on the left.  Very limited knowledge of home program    Time 4    Period Weeks    Status New    Target Date 09/27/21               OT Long Term Goals - 08/30/21 1342       OT LONG TERM GOAL #1   Title Patient right thumb and middle finger active range of motion improved to within functional limits with triggering less than 1 time a day to be able to perform ADLs and ADLs with pain less than 2/10    Baseline Right thumb and third digit pain is 5-10/10 with tenderness over the A1 pulleys no active range of motion at thumb IP, AROM at third digit but limited endrange.  During daily and in the morning.    Time 6    Period Weeks    Status New    Target Date 10/11/21      OT LONG TERM GOAL #2   Title Left thumb tenosynovitis pain decreased to less than 1/10 for patient to be wean out of thumb spica to a neoprene to be able to perform ADLs and IADLs with more ease.  .    Baseline Tenderness 7/10 over left thumb first dorsal compartment.  Decreased thumb palmar radial abduction with pain with radial abduction as well as endrange flexion extension    Time 8    Period Weeks    Status New    Target Date 10/25/21      OT LONG TERM GOAL #3   Title Patient's bilateral grip and prehension strength increased to within functional limits with no increase symptoms more than 2/10.    Baseline Not tested because of pain 10/10 with no IP flexion on the right and Duker vein tenderness over distal radius 7/10 and Finkelstein 3/10.    Time 8    Period Weeks    Status New    Target Date 10/25/21                   Plan - 09/10/21 1742     Clinical Impression Statement Patient presented at OT evaluation with a diagnosis of right dominant handtrigger thumb 10/10 tenderness over A1 pulley and unable  to do any active range of motion of the thumb IP.   Third trigger finger with tenderness 5/10 over A1 pulley with decreased endrange with some triggering during the day.  In the left first dorsal compartment tenosynovitis with a tenderness 7/10 over distal radius with a positive Finkelstein of 3/10 at eval.   Patient continues to demonstrate limited thumb range of motion in the right in all planes in all joints.  Left decreased thumb active range of motion with pain in radial abduction as well as wrist flexion extension radial ulnar deviation at end range.  Thumb spica splint was fabricated and adjusted last session for left to immobilize thumb MC CMC to decrease tenosynovitis.  Iontophoresis with dexamethasone, 3rd treatment  to left thumb first dorsal compartment and a right thumb trigger with good toleration and decreased pain, she is indicating decreased thumb triggering this date. Patient was able to tolerate a 1.5 current with some tenderness but no skin issues.  Pt was hospitalized this week for a couple days after episode of leg weakness, tests were negative for stroke but possible TIA.  When pt went to MD appt post hospitalization, he felt it is more likely compressive neuropathy from sleeping position and history of sciatica.  He cleared her to continue with therapy and use of ionto.  Patient limited in use of bilateral hands and ADLs and IADLs by pain as well as decreased range of motion and strength.  Patient can benefit from skilled OT services to decrease pain and increase range of motion and strength to be able to perform ADLs and IADLs.  Patient do want to avoid surgery due to cancer treatment.    OT Occupational Profile and History Problem Focused Assessment - Including review of records relating to presenting problem    Occupational performance deficits (Please refer to evaluation for details): ADL's;IADL's;Rest and Sleep;Play;Work;Leisure;Social Participation    Body Structure / Function / Physical Skills ADL;Strength;Pain;Dexterity;UE  functional use;Edema;IADL;ROM;Flexibility;FMC;Decreased knowledge of precautions    Rehab Potential Fair    Clinical Decision Making Limited treatment options, no task modification necessary    Comorbidities Affecting Occupational Performance: May have comorbidities impacting occupational performance    Modification or Assistance to Complete Evaluation  Min-Moderate modification of tasks or assist with assess necessary to complete eval    OT Frequency 2x / week    OT Duration 8 weeks    OT Treatment/Interventions Self-care/ADL training;Cryotherapy;Iontophoresis;Ultrasound;Fluidtherapy;Contrast Bath;Therapeutic exercise;Manual Therapy;Patient/family education;Passive range of motion;DME and/or AE instruction;Splinting    Consulted and Agree with Plan of Care Patient             Patient will benefit from skilled therapeutic intervention in order to improve the following deficits and impairments:   Body Structure / Function / Physical Skills: ADL, Strength, Pain, Dexterity, UE functional use, Edema, IADL, ROM, Flexibility, FMC, Decreased knowledge of precautions       Visit Diagnosis: Radial styloid tenosynovitis  Pain in left wrist  Trigger thumb of both hands  Pain in right hand  Trigger finger, right middle finger  Muscle weakness (generalized)    Problem List 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, [place], [address] Phone: [phone]   Fax:  [phone]  Name: Emily Soto MRN: [id number] Date  of Birth: 09-06-1960

## 2021-09-10 NOTE — Therapy (Signed)
[facility] [address] [place], [address] Phone: [phone]   Fax:  [phone]  Occupational Therapy Treatment  Patient Details  Name: Emily Soto MRN: [id number] Date of Birth: 1960-12-19 Referring Provider (OT): Dr. Emily Filbert   Encounter Date: 09/02/2021   OT End of Session - 09/10/21 1721     Visit Number 2    Number of Visits 12    Date for OT Re-Evaluation 10/25/21    OT Start Time 0945    OT Stop Time 1036    OT Time Calculation (min) 51 min    Activity Tolerance Patient tolerated treatment well    Behavior During Therapy [facility] for tasks assessed/performed             Past Medical History:  Diagnosis Date   B12 deficiency    Endometrial cancer ([place])    GERD (gastroesophageal reflux disease)     History reviewed. No pertinent surgical history.  There were no vitals filed for this visit.   Subjective Assessment - 09/10/21 1719     Subjective  Pt reports she is doing well, "I am glad to have found out about you all, I didn't want to have surgery"    Pertinent History Patient's endometrial cancer has been stable. Has not had a colonoscopy, did have a normal Cologuard a number of years ago. Has progressive right hand issues when see by Dr Sabra Heck in June - refer to Dr Amedeo Plenty - but pt do not want surgery - , particular middle finger trigger finger and thumb - as well as tenosynitivitis R thumb - pt refer to OT    Patient Stated Goals I want to avoid surgery with having cancer treatment.  I want the pain better so I can use my hands and the things I like to do.    Currently in Pain? Yes    Pain Location Hand    Pain Orientation Right;Left    Pain Descriptors / Indicators Tender;Aching;Tightness    Pain Type Acute pain;Chronic pain    Pain Onset More than a month ago    Pain Frequency Constant            Contrast to right and left hands for 8 mins to decrease inflammation, pain and  edema.    Adjustment to custom forearm-based thumb spica with a neoprene brace to left hand and wrist to decrease symptoms of tenosynovitis while doing iontophoresis.  Manual therapy:  Soft tissue massage to bilateral forearms, carpal and metacarpal spreads, decreased pain at A1 pulley this date.    iontophoresis with dexamethasone with a small patch on the right thumb A1 pulley and the left first dorsal compartment and medium patch.  Modified the ionto patch on the right to wrap around thumb and have a better connection to skin this date.  Patient was able to tolerate a 1.4 current on the right thumb 1.6 on the left first dorsal compartment.  Skin inspected prior to ionto with no issues, left patches on for one hour after treatment.                         OT Education - 09/10/21 1720     Education Details ionto, splint wear    Person(s) Educated Patient    Methods Explanation;Demonstration;Tactile cues;Verbal cues;Handout    Comprehension Verbal cues required;Returned demonstration;Verbalized understanding  OT Short Term Goals - 08/30/21 1341       OT SHORT TERM GOAL #1   Title Patient to be independent and wearing of splints as well as doing home program to decrease pain and tenderness to less than 3/10 in left and right hand.    Baseline Left wrist and hand pain 7/10.  Right thumb 10/10 over A1 pulley as well as third digit 5/10.  No splints she has a prefab that is poor fitting and immobilizing the whole thumb on the left.  Very limited knowledge of home program    Time 4    Period Weeks    Status New    Target Date 09/27/21               OT Long Term Goals - 08/30/21 1342       OT LONG TERM GOAL #1   Title Patient right thumb and middle finger active range of motion improved to within functional limits with triggering less than 1 time a day to be able to perform ADLs and ADLs with pain less than 2/10    Baseline Right thumb and third  digit pain is 5-10/10 with tenderness over the A1 pulleys no active range of motion at thumb IP, AROM at third digit but limited endrange.  During daily and in the morning.    Time 6    Period Weeks    Status New    Target Date 10/11/21      OT LONG TERM GOAL #2   Title Left thumb tenosynovitis pain decreased to less than 1/10 for patient to be wean out of thumb spica to a neoprene to be able to perform ADLs and IADLs with more ease.  .    Baseline Tenderness 7/10 over left thumb first dorsal compartment.  Decreased thumb palmar radial abduction with pain with radial abduction as well as endrange flexion extension    Time 8    Period Weeks    Status New    Target Date 10/25/21      OT LONG TERM GOAL #3   Title Patient's bilateral grip and prehension strength increased to within functional limits with no increase symptoms more than 2/10.    Baseline Not tested because of pain 10/10 with no IP flexion on the right and Duker vein tenderness over distal radius 7/10 and Finkelstein 3/10.    Time 8    Period Weeks    Status New    Target Date 10/25/21                   Plan - 09/10/21 1721     Clinical Impression Statement Patient presented at OT evaluation with a diagnosis of right dominant handtrigger thumb 10/10 tenderness over A1 pulley and unable to do any active range of motion of the thumb IP.  Third trigger finger with tenderness 5/10 over A1 pulley with decreased endrange with some triggering during the day.  In the left first dorsal compartment tenosynovitis with a tenderness 7/10 over distal radius with a positive Finkelstein of 3/10.  Patient with limited thumb range of motion in the right in all planes in all joints.  Left decreased thumb active range of motion with pain in radial abduction as well as wrist flexion extension radial ulnar deviation at end range.  Thumb spica splint was fabricated and adjusted this date for left to immobilize thumb MC CMC to decrease  tenosynovitis.  Iontophoresis with dexamethasone  to left thumb  first dorsal compartment and a right thumb trigger with good toleration and decreased pain noted this date. Patient was able to tolerate a 1.4 current with some tenderness but no skin issues.  Patient limited in use of bilateral hands and ADLs and IADLs by pain as well as decreased range of motion and strength.  Patient can benefit from skilled OT services to decrease pain and increase range of motion and strength to be able to perform ADLs and IADLs.  Patient do want to avoid surgery due to cancer treatment.    OT Occupational Profile and History Problem Focused Assessment - Including review of records relating to presenting problem    Occupational performance deficits (Please refer to evaluation for details): ADL's;IADL's;Rest and Sleep;Play;Work;Leisure;Social Participation    Body Structure / Function / Physical Skills ADL;Strength;Pain;Dexterity;UE functional use;Edema;IADL;ROM;Flexibility;FMC;Decreased knowledge of precautions    Rehab Potential Fair    Clinical Decision Making Limited treatment options, no task modification necessary    Comorbidities Affecting Occupational Performance: May have comorbidities impacting occupational performance    Modification or Assistance to Complete Evaluation  Min-Moderate modification of tasks or assist with assess necessary to complete eval    OT Frequency 2x / week    OT Duration 8 weeks    OT Treatment/Interventions Self-care/ADL training;Cryotherapy;Iontophoresis;Ultrasound;Fluidtherapy;Contrast Bath;Therapeutic exercise;Manual Therapy;Patient/family education;Passive range of motion;DME and/or AE instruction;Splinting    Consulted and Agree with Plan of Care Patient             Patient will benefit from skilled therapeutic intervention in order to improve the following deficits and impairments:   Body Structure / Function / Physical Skills: ADL, Strength, Pain, Dexterity, UE functional  use, Edema, IADL, ROM, Flexibility, FMC, Decreased knowledge of precautions       Visit Diagnosis: Radial styloid tenosynovitis  Trigger thumb of both hands  Trigger finger, right middle finger  Pain in left wrist  Pain in right hand  Muscle weakness (generalized)    Problem List 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 [place], [address] Phone: [phone]   Fax:  [phone]  Name: Lorella Gomez MRN: [id number] Date of Birth: 03-Sep-1960

## 2021-09-13 ENCOUNTER — Ambulatory Visit: Payer: BC Managed Care – PPO | Attending: Internal Medicine | Admitting: Occupational Therapy

## 2021-09-13 DIAGNOSIS — M65311 Trigger thumb, right thumb: Secondary | ICD-10-CM | POA: Diagnosis present

## 2021-09-13 DIAGNOSIS — M6281 Muscle weakness (generalized): Secondary | ICD-10-CM | POA: Diagnosis present

## 2021-09-13 DIAGNOSIS — M25532 Pain in left wrist: Secondary | ICD-10-CM | POA: Diagnosis present

## 2021-09-13 DIAGNOSIS — M79641 Pain in right hand: Secondary | ICD-10-CM | POA: Diagnosis present

## 2021-09-13 DIAGNOSIS — M65312 Trigger thumb, left thumb: Secondary | ICD-10-CM | POA: Diagnosis present

## 2021-09-13 DIAGNOSIS — M65331 Trigger finger, right middle finger: Secondary | ICD-10-CM | POA: Insufficient documentation

## 2021-09-13 DIAGNOSIS — M654 Radial styloid tenosynovitis [de Quervain]: Secondary | ICD-10-CM | POA: Diagnosis present

## 2021-09-14 ENCOUNTER — Encounter: Payer: Self-pay | Admitting: Occupational Therapy

## 2021-09-14 NOTE — Therapy (Signed)
[facility] [address], [place], [address] Phone: [phone]   Fax:  [phone]  Occupational Therapy Treatment  Patient Details  Name: Emily Soto MRN: [id number] Date of Birth: 1960/12/08 Referring Provider (OT): Dr. Emily Filbert   Encounter Date: 09/13/2021   OT End of Session - 09/15/21 0848     Visit Number 4    Number of Visits 12    Date for OT Re-Evaluation 10/25/21    OT Start Time 1030    OT Stop Time 1115    OT Time Calculation (min) 45 min    Activity Tolerance Patient tolerated treatment well    Behavior During Therapy [facility] for tasks assessed/performed             Past Medical History:  Diagnosis Date   B12 deficiency    Endometrial cancer ([place])    GERD (gastroesophageal reflux disease)     History reviewed. No pertinent surgical history.  Decreased pain overall, 2/10 at wrist on left, none on right, doing more cooking at home over the last week.   Right thumb tender at times, able to oppose thumb to index and middle finger without any pain  and mild triggering.       Rationale for Evaluation and Treatment Rehabilitation     Contrast to right and left hands for 11 mins to decrease inflammation, pain and edema.  Performed prior to manual therapy    Manual therapy:  Soft tissue massage to bilateral forearms, carpal and metacarpal spreads, continues to report decreased pain at A1 pulley over the last week.  Use of graston Nr2 tool sweeping over volar surface of forearms to decrease flexor tightness and fascial restrictions.     Pain free active  range of motion for left wrist, digits and thumb PROM of thumb IP on right    4th ionto tx for this episode of care iontophoresis with dexamethasone with a small patch on the right thumb A1 pulley and the left first dorsal compartment and medium patch.  Continued to modify ionto patch on the right to wrap around thumb to ensure a  better connection to skin. Patient was able to tolerate a 1.3 current on the right thumb 1.5  on the left first dorsal compartment, run time 26 mins  Skin inspected prior to ionto with no issues, left patches on for one hour after treatment.                          OT Education - 09/15/21 0848     Education Details ionto, splint wear, contrast, using larger grips on items    Person(s) Educated Patient    Methods Explanation;Demonstration;Tactile cues;Verbal cues;Handout    Comprehension Verbal cues required;Returned demonstration;Verbalized understanding              OT Short Term Goals - 08/30/21 1341       OT SHORT TERM GOAL #1   Title Patient to be independent and wearing of splints as well as doing home program to decrease pain and tenderness to less than 3/10 in left and right hand.    Baseline Left wrist and hand pain 7/10.  Right thumb 10/10 over A1 pulley as well as third digit 5/10.  No splints she has a prefab that is poor fitting and immobilizing the whole thumb on the left.  Very limited knowledge of home program    Time 4  Period Weeks    Status New    Target Date 09/27/21               OT Long Term Goals - 08/30/21 1342       OT LONG TERM GOAL #1   Title Patient right thumb and middle finger active range of motion improved to within functional limits with triggering less than 1 time a day to be able to perform ADLs and ADLs with pain less than 2/10    Baseline Right thumb and third digit pain is 5-10/10 with tenderness over the A1 pulleys no active range of motion at thumb IP, AROM at third digit but limited endrange.  During daily and in the morning.    Time 6    Period Weeks    Status New    Target Date 10/11/21      OT LONG TERM GOAL #2   Title Left thumb tenosynovitis pain decreased to less than 1/10 for patient to be wean out of thumb spica to a neoprene to be able to perform ADLs and IADLs with more ease.  .    Baseline  Tenderness 7/10 over left thumb first dorsal compartment.  Decreased thumb palmar radial abduction with pain with radial abduction as well as endrange flexion extension    Time 8    Period Weeks    Status New    Target Date 10/25/21      OT LONG TERM GOAL #3   Title Patient's bilateral grip and prehension strength increased to within functional limits with no increase symptoms more than 2/10.    Baseline Not tested because of pain 10/10 with no IP flexion on the right and Duker vein tenderness over distal radius 7/10 and Finkelstein 3/10.    Time 8    Period Weeks    Status New    Target Date 10/25/21                   Plan - 09/15/21 0849     Clinical Impression Statement Patient presented at OT evaluation with a diagnosis of right dominant handtrigger thumb 10/10 tenderness over A1 pulley and unable to do any active range of motion of the thumb IP.  Third trigger finger with tenderness 5/10 over A1 pulley with decreased endrange with some triggering during the day.  In the left first dorsal compartment tenosynovitis with a tenderness 7/10 over distal radius with a positive Wynn Maudlin of 3/10 at eval.   Patient continues to demonstrate limited thumb range of motion in the right in all planes in all joints but has improved with treatment sessions, decreased pain and increased use in daily tasks.  Left decreased thumb active range of motion with pain in radial abduction as well as wrist flexion extension radial ulnar deviation at end range.  Thumb spica splint was fabricated and adjusted last session for left to immobilize thumb MC CMC to decrease tenosynovitis.  Iontophoresis with dexamethasone, 4th  treatment  to left thumb first dorsal compartment and a right thumb trigger with good toleration and decreased pain, she is indicating decreased thumb triggering this date. Patient was able to tolerate a 1.3 current with some tenderness but no skin issues.  Pt was hospitalized last  week for  a couple days after episode of leg weakness, tests were negative for stroke but possible TIA.  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  He cleared her to continue with therapy and use of ionto. Pt has reported decreased pain overall in both hands/wrists since starting therapy and is pleased with progress.  She has started to engage in more tasks such as cooking at home and assisting with cleaning tasks before starting school again for the fall.  Patient can benefit from skilled OT services to decrease pain and increase range of motion and strength to be able to perform ADLs and IADLs.  Patient do want to avoid surgery due to cancer treatment.    OT Occupational Profile and History Problem Focused Assessment - Including review of records relating to presenting problem    Occupational performance deficits (Please refer to evaluation for details): ADL's;IADL's;Rest and Sleep;Play;Work;Leisure;Social Participation    Body Structure / Function / Physical Skills ADL;Strength;Pain;Dexterity;UE functional use;Edema;IADL;ROM;Flexibility;FMC;Decreased knowledge of precautions    Rehab Potential Fair    Clinical Decision Making Limited treatment options, no task modification necessary    Comorbidities Affecting Occupational Performance: May have comorbidities impacting occupational performance    Modification or Assistance to Complete Evaluation  Min-Moderate modification of tasks or assist with assess necessary to complete eval    OT Frequency 2x / week    OT Duration 8 weeks    OT Treatment/Interventions Self-care/ADL training;Cryotherapy;Iontophoresis;Ultrasound;Fluidtherapy;Contrast Bath;Therapeutic exercise;Manual Therapy;Patient/family education;Passive range of motion;DME and/or AE instruction;Splinting    Consulted and Agree with Plan of Care Patient             Patient will benefit from skilled  therapeutic intervention in order to improve the following deficits and impairments:   Body Structure / Function / Physical Skills: ADL, Strength, Pain, Dexterity, UE functional use, Edema, IADL, ROM, Flexibility, FMC, Decreased knowledge of precautions       Visit Diagnosis: Radial styloid tenosynovitis  Pain in right hand  Pain in left wrist  Trigger finger, right middle finger  Trigger thumb of both hands  Muscle weakness (generalized)    Problem List 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 [place], [address] Phone: [phone]   Fax:  [phone]  Name: Emily Soto MRN: [id number] Date of Birth: 12/17/1960

## 2021-09-15 ENCOUNTER — Ambulatory Visit: Payer: BC Managed Care – PPO | Admitting: Occupational Therapy

## 2021-09-15 DIAGNOSIS — M65331 Trigger finger, right middle finger: Secondary | ICD-10-CM

## 2021-09-15 DIAGNOSIS — M6281 Muscle weakness (generalized): Secondary | ICD-10-CM

## 2021-09-15 DIAGNOSIS — M79641 Pain in right hand: Secondary | ICD-10-CM

## 2021-09-15 DIAGNOSIS — M65311 Trigger thumb, right thumb: Secondary | ICD-10-CM

## 2021-09-15 DIAGNOSIS — M25532 Pain in left wrist: Secondary | ICD-10-CM

## 2021-09-15 DIAGNOSIS — M654 Radial styloid tenosynovitis [de Quervain]: Secondary | ICD-10-CM

## 2021-09-18 ENCOUNTER — Encounter: Payer: Self-pay | Admitting: Occupational Therapy

## 2021-09-18 NOTE — Therapy (Signed)
[facility] [address] [place], [address] Phone: [phone]   Fax:  [phone]  Occupational Therapy Treatment  Patient Details  Name: Marvina Danner MRN: [id number] Date of Birth: Dec 03, 1960 Referring Provider (OT): Dr. Emily Filbert   Encounter Date: 09/15/2021   OT End of Session - 09/18/21 1122     Visit Number 5    Number of Visits 12    Date for OT Re-Evaluation 10/25/21    OT Start Time 7741    OT Stop Time 1600    OT Time Calculation (min) 45 min    Activity Tolerance Patient tolerated treatment well    Behavior During Therapy [facility] for tasks assessed/performed             Past Medical History:  Diagnosis Date   B12 deficiency    Endometrial cancer ([place])    GERD (gastroesophageal reflux disease)     History reviewed. No pertinent surgical history.  There were no vitals filed for this visit.   Subjective Assessment - 09/18/21 1120     Subjective  Pt reports she was able to complete all her tasks she wanted to get done before school starts back.  Reports continued progress with bilateral UE and feels she is responding well to ionto treatments, pain decreased.    Pertinent History Patient's endometrial cancer has been stable. Has not had a colonoscopy, did have a normal Cologuard a number of years ago. Has progressive right hand issues when see by Dr Sabra Heck in June - refer to Dr Amedeo Plenty - but pt do not want surgery - , particular middle finger trigger finger and thumb - as well as tenosynitivitis R thumb - pt refer to OT    Patient Stated Goals I want to avoid surgery with having cancer treatment.  I want the pain better so I can use my hands and the things I like to do.    Currently in Pain? Yes    Pain Score 2     Pain Location Wrist    Pain Orientation Left    Pain Descriptors / Indicators Tender    Pain Type Acute pain    Pain Onset More than a month ago    Pain Frequency Constant              Rationale for Evaluation and Treatment Rehabilitation   Contrast:  Contrast to right and left hands for 11 mins to decrease inflammation, pain and edema.  Performed prior to manual therapy    Manual therapy:  Manual Soft tissue massage to bilateral forearms, carpal and metacarpal spreads, continues to report decreased pain at A1 pulley and at the base of the thumb over the last week.  Use of graston Nr2 tool sweeping over volar surface of forearms to decrease flexor tightness and fascial restrictions.    Therapeutic Exercise: Pain free active  range of motion for left wrist, digits and thumb PROM of thumb IP on right, reports she is trying to avoid moving it into certain positions to avoid triggering but reports decreased overall triggering and pain with daily use in self care and IADL tasks.  She has continued to avoid tight gripping patterns, using larger grip tools when performing tasks.     Ionto: 5th ionto tx for this episode of care iontophoresis with dexamethasone with a small patch on the right thumb A1 pulley and the left first dorsal compartment and medium patch.  Continued to modify ionto  patch on the right to wrap around thumb to ensure a better connection to skin. Patient was able to tolerate a 1.5 current on the right thumb 1.4 on the left first dorsal compartment, run time 28 mins  Skin inspected prior to ionto with no issues, left patches on for one hour after treatment.                        OT Education - 09/18/21 1122     Education Details ionto, splint wear, contrast, using larger grips on items    Person(s) Educated Patient    Methods Explanation;Demonstration;Tactile cues;Verbal cues;Handout    Comprehension Verbal cues required;Returned demonstration;Verbalized understanding              OT Short Term Goals - 08/30/21 1341       OT SHORT TERM GOAL #1   Title Patient to be independent and wearing of splints as well as doing  home program to decrease pain and tenderness to less than 3/10 in left and right hand.    Baseline Left wrist and hand pain 7/10.  Right thumb 10/10 over A1 pulley as well as third digit 5/10.  No splints she has a prefab that is poor fitting and immobilizing the whole thumb on the left.  Very limited knowledge of home program    Time 4    Period Weeks    Status New    Target Date 09/27/21               OT Long Term Goals - 08/30/21 1342       OT LONG TERM GOAL #1   Title Patient right thumb and middle finger active range of motion improved to within functional limits with triggering less than 1 time a day to be able to perform ADLs and ADLs with pain less than 2/10    Baseline Right thumb and third digit pain is 5-10/10 with tenderness over the A1 pulleys no active range of motion at thumb IP, AROM at third digit but limited endrange.  During daily and in the morning.    Time 6    Period Weeks    Status New    Target Date 10/11/21      OT LONG TERM GOAL #2   Title Left thumb tenosynovitis pain decreased to less than 1/10 for patient to be wean out of thumb spica to a neoprene to be able to perform ADLs and IADLs with more ease.  .    Baseline Tenderness 7/10 over left thumb first dorsal compartment.  Decreased thumb palmar radial abduction with pain with radial abduction as well as endrange flexion extension    Time 8    Period Weeks    Status New    Target Date 10/25/21      OT LONG TERM GOAL #3   Title Patient's bilateral grip and prehension strength increased to within functional limits with no increase symptoms more than 2/10.    Baseline Not tested because of pain 10/10 with no IP flexion on the right and Duker vein tenderness over distal radius 7/10 and Finkelstein 3/10.    Time 8    Period Weeks    Status New    Target Date 10/25/21                   Plan - 09/18/21 1123     Clinical Impression Statement Patient presented at OT evaluation with a diagnosis  of right dominant handtrigger thumb 10/10 tenderness over A1 pulley and unable to do any active range of motion of the thumb IP.  Third trigger finger with tenderness 5/10 over A1 pulley with decreased endrange with some triggering during the day.  In the left first dorsal compartment tenosynovitis with a tenderness 7/10 over distal radius with a positive Wynn Maudlin of 3/10 at eval.   Patient continues to demonstrate limited thumb range of motion in the right in all planes in all joints but has improved with treatment sessions, decreased pain and increased use in daily tasks.  Pt denies pain in right thumb this date at rest and pain left wrist 2/10.  Iontophoresis with dexamethasone, 5th  treatment  to left thumb first dorsal compartment and a right thumb trigger with good toleration and decreased pain, she is indicating decreased thumb triggering overall.  She has started to engage in more tasks such as cooking at home and assisting with cleaning tasks before starting school again for the fall. Continued mild tenderness at the base of left thumb and wrist this date but greatly improved since evaluation.   Patient can benefit from skilled OT services to decrease pain and increase range of motion and strength to be able to perform ADLs and IADLs.  Patient do want to avoid surgery due to cancer treatment.    OT Occupational Profile and History Problem Focused Assessment - Including review of records relating to presenting problem    Occupational performance deficits (Please refer to evaluation for details): ADL's;IADL's;Rest and Sleep;Play;Work;Leisure;Social Participation    Body Structure / Function / Physical Skills ADL;Strength;Pain;Dexterity;UE functional use;Edema;IADL;ROM;Flexibility;FMC;Decreased knowledge of precautions    Rehab Potential Fair    Clinical Decision Making Limited treatment options, no task modification necessary    Comorbidities Affecting Occupational Performance: May have comorbidities  impacting occupational performance    Modification or Assistance to Complete Evaluation  Min-Moderate modification of tasks or assist with assess necessary to complete eval    OT Frequency 2x / week    OT Duration 8 weeks    OT Treatment/Interventions Self-care/ADL training;Cryotherapy;Iontophoresis;Ultrasound;Fluidtherapy;Contrast Bath;Therapeutic exercise;Manual Therapy;Patient/family education;Passive range of motion;DME and/or AE instruction;Splinting    Consulted and Agree with Plan of Care Patient             Patient will benefit from skilled therapeutic intervention in order to improve the following deficits and impairments:   Body Structure / Function / Physical Skills: ADL, Strength, Pain, Dexterity, UE functional use, Edema, IADL, ROM, Flexibility, FMC, Decreased knowledge of precautions       Visit Diagnosis: Radial styloid tenosynovitis  Pain in right hand  Pain in left wrist  Trigger finger, right middle finger  Trigger thumb of both hands  Muscle weakness (generalized)    Problem List 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 [place], [address] Phone: [phone]   Fax:  [phone]  Name: Ermal Brzozowski MRN: [id number] Date of Birth: Jul 21, 1960

## 2021-09-20 ENCOUNTER — Ambulatory Visit: Payer: BC Managed Care – PPO | Admitting: Occupational Therapy

## 2021-09-20 DIAGNOSIS — M25532 Pain in left wrist: Secondary | ICD-10-CM

## 2021-09-20 DIAGNOSIS — M6281 Muscle weakness (generalized): Secondary | ICD-10-CM

## 2021-09-20 DIAGNOSIS — M654 Radial styloid tenosynovitis [de Quervain]: Secondary | ICD-10-CM | POA: Diagnosis not present

## 2021-09-20 DIAGNOSIS — M65331 Trigger finger, right middle finger: Secondary | ICD-10-CM

## 2021-09-20 DIAGNOSIS — M65311 Trigger thumb, right thumb: Secondary | ICD-10-CM

## 2021-09-20 DIAGNOSIS — M79641 Pain in right hand: Secondary | ICD-10-CM

## 2021-09-20 NOTE — Therapy (Signed)
[facility] [address] [place], [address] Phone: [phone]   Fax:  [phone]  Occupational Therapy Treatment  Patient Details  Name: Emily Soto MRN: [id number] Date of Birth: 1960/03/28 Referring Provider (OT): Dr. Emily Filbert   Encounter Date: 09/20/2021   OT End of Session - 09/20/21 1331     Visit Number 6    Number of Visits 12    Date for OT Re-Evaluation 10/25/21    OT Start Time 1130    OT Stop Time 1245    OT Time Calculation (min) 75 min    Activity Tolerance Patient tolerated treatment well    Behavior During Therapy [facility] for tasks assessed/performed             Past Medical History:  Diagnosis Date   B12 deficiency    Endometrial cancer ([place])    GERD (gastroesophageal reflux disease)     No past surgical history on file.  There were no vitals filed for this visit.   Subjective Assessment - 09/20/21 1328     Pertinent History Patient's endometrial cancer has been stable. Has not had a colonoscopy, did have a normal Cologuard a number of years ago. Has progressive right hand issues when see by Dr Sabra Heck in June - refer to Dr Amedeo Plenty - but pt do not want surgery - , particular middle finger trigger finger and thumb - as well as tenosynitivitis R thumb - pt refer to OT    Patient Stated Goals I want to avoid surgery with having cancer treatment.  I want the pain better so I can use my hands and the things I like to do.    Currently in Pain? Yes    Pain Score 7     Pain Location --   Right thumb A1 pulley 7/10.  Left first dorsal compartment 1-3/10   Pain Orientation Right;Left    Pain Descriptors / Indicators Tender;Tightness    Pain Type Acute pain    Pain Onset More than a month ago    Pain Frequency Intermittent                [facility] OT Assessment - 09/20/21 0001       AROM   Overall AROM Comments 3/10 pain at first dorsal compartment with ulnar deviation and wrist  flexion; extension      Right Hand AROM   R Thumb IP 0-80 30 Degrees   PROM 60,blocked 50   R Thumb Opposition to Index --   Opposition to fifth with IP flexion of 30 degrees                Patient arrive with custom thumb spica on left wrist and thumb.  Patient reported wearing it about 50% of the time.  At nighttime has some discomfort at the thumb [facility]. Fitted patient with a prefab thumb spica to wear at nighttime to ease discomfort.  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 Patient reports has increased discomfort pain in the right hand and wrist since using Tenderness left first dorsal compartment 2/10 Finkelstein 2/10 Ulnar deviation endrange as well as wrist flexion extension 3/10.  Left third digit flexion still decreased thumb triggering as well as tenderness over the A1 pulley 4/10  Right thumb range of motion increased to  30 degrees, passive range of motion 60 and is blocked able to do 50 degrees. Still triggering at right thumb and tenderness 7/10 over A1 pulley          OT Treatments/Exercises (OP) - 09/20/21 0001       Iontophoresis   Type of Iontophoresis Dexamethasone    Location Right thumb A1 pulley, left first dorsal compartment    Dose Small patch right, medium patch left at 2.0 current on the right 1.6 on the left    Time 22      RUE Contrast Bath   Time 8 minutes    Comments Prior to soft tissue and active range of motion to hand and wrist      LUE Contrast Bath   Time 8 minutes    Comments Prior to soft tissue and range of motion to the left hand and wrist             Rationale for Evaluation and Treatment Rehabilitation   Contrast:  Contrast to right and left hands for 8 mins to decrease inflammation, pain and edema.  Performed prior to manual therapy    Manual therapy:   Manual Soft tissue massage to bilateral forearms, carpal and metacarpal spreads, continues to report decreased pain at A1 pulley and at the base of the thumb over the last week.  Use of graston Nr2 tool sweeping over volar  and radial L surface of forearms to decrease flexor tightness and fascial restrictions. Volar R thumb and L thumb /3rd digit - and palm - prior to ROM   Therapeutic Exercise: Pain free active  range of motion for left wrist, digits and thumb in all planes PROM of thumb IP , PROM composite flexion prior to opposition to all digits - extention inbetween .  She has continued to avoid tight gripping patterns, using larger grip tools when performing tasks.     Ionto: 6th ionto tx for this episode of care Pt had not consistent ionto for the 6 sessions -was hospitalized because of a possible TIA.          OT Education - 09/20/21 1331     Education Details ionto, splint wear, contrast, joint protection education and modification    Person(s) Educated Patient    Methods Explanation;Demonstration;Tactile cues;Verbal cues;Handout    Comprehension Verbal cues required;Returned demonstration;Verbalized understanding              OT Short Term Goals - 08/30/21 1341       OT SHORT TERM GOAL #1   Title Patient to be independent and wearing of splints as well as doing home program to decrease pain and tenderness to less than 3/10 in left and right hand.    Baseline Left wrist and hand pain 7/10.  Right thumb 10/10 over A1 pulley as well as third digit 5/10.  No splints she has a prefab that is poor fitting and immobilizing the whole thumb on the left.  Very limited knowledge of home program    Time 4    Period Weeks    Status New    Target Date 09/27/21               OT Long Term Goals - 08/30/21 1342       OT LONG TERM GOAL #1   Title Patient right thumb and middle finger active range of motion improved to within functional limits with triggering less than 1  time a day to be able to perform  ADLs and ADLs with pain less than 2/10    Baseline Right thumb and third digit pain is 5-10/10 with tenderness over the A1 pulleys no active range of motion at thumb IP, AROM at third digit but limited endrange.  During daily and in the morning.    Time 6    Period Weeks    Status New    Target Date 10/11/21      OT LONG TERM GOAL #2   Title Left thumb tenosynovitis pain decreased to less than 1/10 for patient to be wean out of thumb spica to a neoprene to be able to perform ADLs and IADLs with more ease.  .    Baseline Tenderness 7/10 over left thumb first dorsal compartment.  Decreased thumb palmar radial abduction with pain with radial abduction as well as endrange flexion extension    Time 8    Period Weeks    Status New    Target Date 10/25/21      OT LONG TERM GOAL #3   Title Patient's bilateral grip and prehension strength increased to within functional limits with no increase symptoms more than 2/10.    Baseline Not tested because of pain 10/10 with no IP flexion on the right and Duker vein tenderness over distal radius 7/10 and Finkelstein 3/10.    Time 8    Period Weeks    Status New    Target Date 10/25/21                   Plan - 09/20/21 1332     Clinical Impression Statement Patient presented at OT evaluation with a diagnosis of right dominant handtrigger thumb 10/10 tenderness over A1 pulley and unable to do any active range of motion of the thumb IP.  Third trigger finger with tenderness 5/10 over A1 pulley with decreased endrange with some triggering during the day.  In the left first dorsal compartment tenosynovitis with a tenderness 7/10 over distal radius with a positive Finkelstein of 3/10 at eval.   patient had 5 sessions of ionto with dexamethasone since evaluation.  It was not consistent because of hospitalization with a possible TIA.  Patient tenderness over right thumb A1 pulley decreased to 7/10 with showing of 60 degrees  of IP passive range of motion and active range of motion of 30 but still triggering and pain.  Left first dorsal compartment tenderness improved to 4/10 with discomfort and pain with Wynn Maudlin and ulnar deviation 3/10.  Patient continues with thumb spica about 50% of the time she reports on the right.  Ankle banding during the day right IP to prevent triggering.  Right third digit still painful and triggering at the PIP joint and A1 pulley but improving in range of motion per patient.  Did not address third digit with ionto.  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  Patient can benefit from continues skilled OT services to decrease pain and increase range of motion and strength without increasing triggering to be able to perform ADLs and IADLs.  Patient do want to avoid surgery due to cancer treatment.    OT Occupational Profile and History Problem Focused Assessment - Including review of records relating to presenting problem    Occupational performance deficits (Please refer to evaluation for details): ADL's;IADL's;Rest and Sleep;Play;Work;Leisure;Social Participation    Body Structure / Function / Physical Skills ADL;Strength;Pain;Dexterity;UE functional use;Edema;IADL;ROM;Flexibility;FMC;Decreased knowledge of precautions    Rehab Potential Fair  Clinical Decision Making Limited treatment options, no task modification necessary    Comorbidities Affecting Occupational Performance: May have comorbidities impacting occupational performance    Modification or Assistance to Complete Evaluation  Min-Moderate modification of tasks or assist with assess necessary to complete eval    OT Frequency 2x / week    OT Duration 8 weeks    OT Treatment/Interventions Self-care/ADL training;Cryotherapy;Iontophoresis;Ultrasound;Fluidtherapy;Contrast Bath;Therapeutic exercise;Manual Therapy;Patient/family education;Passive range of motion;DME and/or AE  instruction;Splinting    Consulted and Agree with Plan of Care Patient             Patient will benefit from skilled therapeutic intervention in order to improve the following deficits and impairments:   Body Structure / Function / Physical Skills: ADL, Strength, Pain, Dexterity, UE functional use, Edema, IADL, ROM, Flexibility, FMC, Decreased knowledge of precautions       Visit Diagnosis: Radial styloid tenosynovitis  Pain in right hand  Pain in left wrist  Trigger finger, right middle finger  Trigger thumb of both hands  Muscle weakness (generalized)    Problem List 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, [place], [address] Phone: [phone]   Fax:  [phone]  Name: Emily Soto MRN: [id number] Date of Birth: September 01, 1960

## 2021-09-22 ENCOUNTER — Ambulatory Visit: Payer: BC Managed Care – PPO | Admitting: Occupational Therapy

## 2021-09-22 DIAGNOSIS — M654 Radial styloid tenosynovitis [de Quervain]: Secondary | ICD-10-CM | POA: Diagnosis not present

## 2021-09-22 DIAGNOSIS — M6281 Muscle weakness (generalized): Secondary | ICD-10-CM

## 2021-09-22 DIAGNOSIS — M79641 Pain in right hand: Secondary | ICD-10-CM

## 2021-09-22 DIAGNOSIS — M25532 Pain in left wrist: Secondary | ICD-10-CM

## 2021-09-22 DIAGNOSIS — M65331 Trigger finger, right middle finger: Secondary | ICD-10-CM

## 2021-09-22 DIAGNOSIS — M65311 Trigger thumb, right thumb: Secondary | ICD-10-CM

## 2021-09-22 NOTE — Therapy (Signed)
[facility] [address] [place], [address] Phone: [phone]   Fax:  [phone]  Occupational Therapy Treatment  Patient Details  Name: Emily Soto MRN: [id number] Date of Birth: 1960-07-01 Referring Provider (OT): Dr. Emily Filbert   Encounter Date: 09/22/2021   OT End of Session - 09/22/21 1831     Visit Number 7    Number of Visits 12    Date for OT Re-Evaluation 10/25/21    OT Start Time 3536    OT Stop Time 1126    OT Time Calculation (min) 51 min    Activity Tolerance Patient tolerated treatment well    Behavior During Therapy [facility] for tasks assessed/performed             Past Medical History:  Diagnosis Date   B12 deficiency    Endometrial cancer ([place])    GERD (gastroesophageal reflux disease)     No past surgical history on file.  There were no vitals filed for this visit.   Subjective Assessment - 09/22/21 1829     Subjective  I can tell the middle finger on my right is getting better motion wise.  I did not push the thumb as much it was sore after last time and the left thumb is getting better I wearing the brace more since you talked with me.  The other softer 1 was good at nighttime for the pain.    Pertinent History Patient's endometrial cancer has been stable. Has not had a colonoscopy, did have a normal Cologuard a number of years ago. Has progressive right hand issues when see by Dr Sabra Heck in June - refer to Dr Amedeo Plenty - but pt do not want surgery - , particular middle finger trigger finger and thumb - as well as tenosynitivitis R thumb - pt refer to OT    Patient Stated Goals I want to avoid surgery with having cancer treatment.  I want the pain better so I can use my hands and the things I like to do.    Currently in Pain? Yes    Pain Score 5     Pain Location --   Left 1st  dorsal compartment; right third A1 pulley thumb A1 pulley   Pain Orientation Right;Left    Pain  Descriptors / Indicators Tightness;Tender;Aching    Pain Type Acute pain    Pain Onset More than a month ago                       Skin check done prior to ionto patient tolerating well still more tender on L  first dorsal compartment Patient to keep patch on for hour after session.     OT Treatments/Exercises (OP) - 09/22/21 0001       Iontophoresis   Type of Iontophoresis Dexamethasone    Location Right thumb A1 pulley, left first dorsal compartment    Dose Small patch right, medium patch left at 2.0 current on the right 1.6 on the left    Time 20      RUE Contrast Bath   Time 8 minutes    Comments Prior to soft tissue and range of motion      LUE Contrast Bath   Time 8 minutes    Comments Prior to soft tissue range of motion              Patient arrive with custom thumb spica on left wrist  and thumb.  Patient reported wearing it more since last time and decreased pain.  At nighttime fitted with a prefab thumb spica brace more comfortable and patient compliant decreasing pain at nighttime.     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  Tenderness left first dorsal compartment 2/10 Finkelstein 2/10 Ulnar deviation endrange as well as wrist flexion extension 3/10.   Left third digit flexion increased to MCP and PIP 90 degrees with less triggering in session- tenderness over the A1 pulley 4/10   Right thumb range of motion increased to 30 degrees, passive range of motion 60 and is blocked able to do 50 degrees. Still triggering at right thumb and tenderness 7/10 over A1 pulley Less tightness and triggering this date compared to her last visit.        OT Education - 09/22/21 1831     Education Details ionto, splint wear, contrast, joint protection education and modification    Person(s)  Educated Patient    Methods Explanation;Demonstration;Tactile cues;Verbal cues;Handout    Comprehension Verbal cues required;Returned demonstration;Verbalized understanding              OT Short Term Goals - 08/30/21 1341       OT SHORT TERM GOAL #1   Title Patient to be independent and wearing of splints as well as doing home program to decrease pain and tenderness to less than 3/10 in left and right hand.    Baseline Left wrist and hand pain 7/10.  Right thumb 10/10 over A1 pulley as well as third digit 5/10.  No splints she has a prefab that is poor fitting and immobilizing the whole thumb on the left.  Very limited knowledge of home program    Time 4    Period Weeks    Status New    Target Date 09/27/21               OT Long Term Goals - 08/30/21 1342       OT LONG TERM GOAL #1   Title Patient right thumb and middle finger active range of motion improved to within functional limits with triggering less than 1 time a day to be able to perform ADLs and ADLs with pain less than 2/10    Baseline Right thumb and third digit pain is 5-10/10 with tenderness over the A1 pulleys no active range of motion at thumb IP, AROM at third digit but limited endrange.  During daily and in the morning.    Time 6    Period Weeks    Status New    Target Date 10/11/21      OT LONG TERM GOAL #2   Title Left thumb tenosynovitis pain decreased to less than 1/10 for patient to be wean out of thumb spica to a neoprene to be able to perform ADLs and IADLs with more ease.  .    Baseline Tenderness 7/10 over left thumb first dorsal compartment.  Decreased thumb palmar radial abduction with pain with radial abduction as well as endrange flexion extension    Time 8    Period Weeks    Status New    Target Date 10/25/21      OT LONG TERM GOAL #3   Title Patient's bilateral grip and prehension strength increased to within functional limits with no increase symptoms more than  2/10.    Baseline Not  tested because of pain 10/10 with no IP flexion on the right and Duker vein tenderness over distal radius 7/10 and Finkelstein 3/10.    Time 8    Period Weeks    Status New    Target Date 10/25/21                   Plan - 09/22/21 1832     Clinical Impression Statement Patient presented at OT evaluation with a diagnosis of right dominant handtrigger thumb 10/10 tenderness over A1 pulley and unable to do any active range of motion of the thumb IP.  Third trigger finger with tenderness 5/10 over A1 pulley with decreased endrange with some triggering during the day.  In the left first dorsal compartment tenosynovitis with a tenderness 7/10 over distal radius with a positive Finkelstein of 3/10 at eval.   patient had 6 sessions of ionto with dexamethasone since evaluation.  It was not consistent because of hospitalization with a possible TIA.  Patient tenderness over right thumb A1 pulley decreased to 7/10 with showing of 60 degrees of IP passive range of motion and active range of motion of 30 but still triggering and pain.  Left first dorsal compartment tenderness improved to 4/10 with discomfort and pain with Wynn Maudlin and ulnar deviation 3/10.  Patient continues with thumb spica about 50% of the time she reports on the right.  [place] during the day right IP to prevent triggering.  Right third digit still painful and triggering at the PIP joint and A1 pulley but improving in range of motion PIP and MC 90.    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  Patient can benefit from continues skilled OT services to decrease pain and increase range of motion and strength without increasing triggering to be able to perform ADLs and IADLs.  Patient do want to avoid surgery due to cancer treatment.    OT Occupational Profile and History Problem Focused Assessment - Including review of records relating to presenting problem    Occupational  performance deficits (Please refer to evaluation for details): ADL's;IADL's;Rest and Sleep;Play;Work;Leisure;Social Participation    Body Structure / Function / Physical Skills ADL;Strength;Pain;Dexterity;UE functional use;Edema;IADL;ROM;Flexibility;FMC;Decreased knowledge of precautions    Rehab Potential Fair    Clinical Decision Making Limited treatment options, no task modification necessary    Comorbidities Affecting Occupational Performance: May have comorbidities impacting occupational performance    Modification or Assistance to Complete Evaluation  Min-Moderate modification of tasks or assist with assess necessary to complete eval    OT Frequency 2x / week    OT Duration 8 weeks    OT Treatment/Interventions Self-care/ADL training;Cryotherapy;Iontophoresis;Ultrasound;Fluidtherapy;Contrast Bath;Therapeutic exercise;Manual Therapy;Patient/family education;Passive range of motion;DME and/or AE instruction;Splinting    Consulted and Agree with Plan of Care Patient             Patient will benefit from skilled therapeutic intervention in order to improve the following deficits and impairments:   Body Structure / Function / Physical Skills: ADL, Strength, Pain, Dexterity, UE functional use, Edema, IADL, ROM, Flexibility, FMC, Decreased knowledge of precautions       Visit Diagnosis: Radial styloid tenosynovitis  Pain in right hand  Pain in left wrist  Trigger finger, right middle finger  Trigger thumb of both hands  Muscle weakness (generalized)    Problem List Patient Active Problem List   Diagnosis Date Noted   TIA (transient ischemic attack)  09/05/2021   Dyslipidemia 09/05/2021   GERD (gastroesophageal reflux disease) 09/05/2021   Cerebrovascular disease 12/11/2018   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, [place], [address] Phone: [phone]   Fax:  [phone]  Name: Emily Soto MRN: [id number] Date of Birth: September 14, 1960

## 2021-09-26 ENCOUNTER — Ambulatory Visit: Payer: BC Managed Care – PPO | Admitting: Occupational Therapy

## 2021-09-26 DIAGNOSIS — M6281 Muscle weakness (generalized): Secondary | ICD-10-CM

## 2021-09-26 DIAGNOSIS — M25532 Pain in left wrist: Secondary | ICD-10-CM

## 2021-09-26 DIAGNOSIS — M654 Radial styloid tenosynovitis [de Quervain]: Secondary | ICD-10-CM

## 2021-09-26 DIAGNOSIS — M79641 Pain in right hand: Secondary | ICD-10-CM

## 2021-09-26 DIAGNOSIS — M65331 Trigger finger, right middle finger: Secondary | ICD-10-CM

## 2021-09-26 DIAGNOSIS — M65311 Trigger thumb, right thumb: Secondary | ICD-10-CM

## 2021-09-26 NOTE — Therapy (Signed)
[facility] [address] [place], [address] Phone: [phone]   Fax:  [phone]  Occupational Therapy Treatment  Patient Details  Name: Emily Soto MRN: [id number] Date of Birth: 06-07-1960 Referring Provider (OT): Dr. Emily Filbert   Encounter Date: 09/26/2021   OT End of Session - 09/26/21 1741     Visit Number 8    Number of Visits 12    Date for OT Re-Evaluation 10/25/21    OT Start Time 1603    OT Stop Time 1710    OT Time Calculation (min) 67 min    Activity Tolerance Patient tolerated treatment well    Behavior During Therapy [facility] for tasks assessed/performed             Past Medical History:  Diagnosis Date   B12 deficiency    Endometrial cancer ([place])    GERD (gastroesophageal reflux disease)     No past surgical history on file.  There were no vitals filed for this visit.   Subjective Assessment - 09/26/21 1739     Subjective  Doing better less pain.  Doing better with the prefab splint at night.  I can cut with a knife now better.  More flexibility in my right thumb that I did not had for a year.    Pertinent History Patient's endometrial cancer has been stable. Has not had a colonoscopy, did have a normal Cologuard a number of years ago. Has progressive right hand issues when see by Dr Sabra Heck in June - refer to Dr Amedeo Plenty - but pt do not want surgery - , particular middle finger trigger finger and thumb - as well as tenosynitivitis R thumb - pt refer to OT    Patient Stated Goals I want to avoid surgery with having cancer treatment.  I want the pain better so I can use my hands and the things I like to do.    Currently in Pain? Yes    Pain Score 4    Left first dorsal compartment, 3/10 right thumb A1 pulley   Pain Location Hand    Pain Orientation Right;Left    Pain Descriptors / Indicators Tender;Tightness    Pain Type Acute pain    Pain Onset More than a month ago    Pain  Frequency Intermittent                        Patient tolerated ionto better patient to keep patch on hour after leaving clinic. Skin check done prior no issues     OT Treatments/Exercises (OP) - 09/26/21 0001       Ultrasound   Ultrasound Location R 3rd A1pulley    Ultrasound Parameters 3.34mz, 20%, 0.8    Ultrasound Goals Pain      Iontophoresis   Type of Iontophoresis Dexamethasone    Location Right thumb A1 pulley, left first dorsal compartment    Dose Small patch right, medium patch- both 1.6 current    Time 23      RUE Contrast Bath   Time 8 minutes    Comments Prior to soft tissue and passive range of motion      LUE Contrast Bath   Time 8 minutes    Comments Prior to soft tissue and range of motion              Patient arrive with custom thumb spica on left wrist and thumb.  Patient  reported wearing it more since last time and decreased pain.  At nighttime fitted with a prefab thumb spica brace more comfortable and patient report decreasing pain at nighttime.     Patient with the right hand with no pain   Patient report increased flexibility in the right thumb as well as middle finger.    Tenderness left first dorsal compartment 4/10 Wynn Maudlin 2/10 No pain with wrist active range of motion.  Left third digit flexion increased to MCP and PIP 90 degrees with less triggering in session- tenderness over the A1 pulley 2/10   Right thumb range of motion increased to 50 degrees, passive range of motion 60 a- increased opposition to fifth digit with less triggering Less tightness and triggering this date compared to her last visit.  In the right thumb and third digit.  Tenderness over A1 pulley at right thumb 3/10             OT Education - 09/26/21 1741     Education Details ionto, splint wear, contrast, joint protection education and modification    Person(s) Educated Patient    Methods Explanation;Demonstration;Tactile cues;Verbal  cues;Handout    Comprehension Verbal cues required;Returned demonstration;Verbalized understanding              OT Short Term Goals - 08/30/21 1341       OT SHORT TERM GOAL #1   Title Patient to be independent and wearing of splints as well as doing home program to decrease pain and tenderness to less than 3/10 in left and right hand.    Baseline Left wrist and hand pain 7/10.  Right thumb 10/10 over A1 pulley as well as third digit 5/10.  No splints she has a prefab that is poor fitting and immobilizing the whole thumb on the left.  Very limited knowledge of home program    Time 4    Period Weeks    Status New    Target Date 09/27/21               OT Long Term Goals - 08/30/21 1342       OT LONG TERM GOAL #1   Title Patient right thumb and middle finger active range of motion improved to within functional limits with triggering less than 1 time a day to be able to perform ADLs and ADLs with pain less than 2/10    Baseline Right thumb and third digit pain is 5-10/10 with tenderness over the A1 pulleys no active range of motion at thumb IP, AROM at third digit but limited endrange.  During daily and in the morning.    Time 6    Period Weeks    Status New    Target Date 10/11/21      OT LONG TERM GOAL #2   Title Left thumb tenosynovitis pain decreased to less than 1/10 for patient to be wean out of thumb spica to a neoprene to be able to perform ADLs and IADLs with more ease.  .    Baseline Tenderness 7/10 over left thumb first dorsal compartment.  Decreased thumb palmar radial abduction with pain with radial abduction as well as endrange flexion extension    Time 8    Period Weeks    Status New    Target Date 10/25/21      OT LONG TERM GOAL #3   Title Patient's bilateral grip and prehension strength increased to within functional limits with no increase symptoms more than 2/10.  Baseline Not tested because of pain 10/10 with no IP flexion on the right and Duker vein  tenderness over distal radius 7/10 and Finkelstein 3/10.    Time 8    Period Weeks    Status New    Target Date 10/25/21                   Plan - 09/26/21 1742     Clinical Impression Statement Patient presented at OT evaluation with a diagnosis of right dominant handtrigger thumb 10/10 tenderness over A1 pulley and unable to do any active range of motion of the thumb IP.  Third trigger finger with tenderness 5/10 over A1 pulley with decreased endrange with some triggering during the day.  In the left first dorsal compartment tenosynovitis with a tenderness 7/10 over distal radius with a positive Finkelstein of 3/10 at eval.   patient had 7 sessions of ionto with dexamethasone since evaluation.  It was not consistent because of hospitalization with a possible TIA.  Patient tenderness over right thumb A1 pulley decreased to 3/10 with showing of 60 degrees of IP passive range of motion and active range of motion of 50 but still triggering to 4th and 5th digit opposition.  Left first dorsal compartment tenderness improved to 4/10 with discomfort and pain with Wynn Maudlin- 1/10 with thumb RA but no pain with wrist AROM. Patient continues with thumb spica  most of the day and prefab at nigh time - without less pain reported.   Right third digit still painful and triggering but less pain and increase AROM. -PIP and MC 90.    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  Patient can benefit from continues skilled OT services to decrease pain and increase range of motion and strength without increasing triggering to be able to perform ADLs and IADLs.  Patient do want to avoid surgery due to cancer treatment.    OT Occupational Profile and History Problem Focused Assessment - Including review of records relating to presenting problem    Occupational performance deficits (Please refer to evaluation for details): ADL's;IADL's;Rest and  Sleep;Play;Work;Leisure;Social Participation    Body Structure / Function / Physical Skills ADL;Strength;Pain;Dexterity;UE functional use;Edema;IADL;ROM;Flexibility;FMC;Decreased knowledge of precautions    Rehab Potential Fair    Clinical Decision Making Limited treatment options, no task modification necessary    Comorbidities Affecting Occupational Performance: May have comorbidities impacting occupational performance    Modification or Assistance to Complete Evaluation  Min-Moderate modification of tasks or assist with assess necessary to complete eval    OT Frequency 2x / week    OT Duration 6 weeks    OT Treatment/Interventions Self-care/ADL training;Cryotherapy;Iontophoresis;Ultrasound;Fluidtherapy;Contrast Bath;Therapeutic exercise;Manual Therapy;Patient/family education;Passive range of motion;DME and/or AE instruction;Splinting    Consulted and Agree with Plan of Care Patient             Patient will benefit from skilled therapeutic intervention in order to improve the following deficits and impairments:   Body Structure / Function / Physical Skills: ADL, Strength, Pain, Dexterity, UE functional use, Edema, IADL, ROM, Flexibility, FMC, Decreased knowledge of precautions       Visit Diagnosis: Radial styloid tenosynovitis  Pain in right hand  Pain in left wrist  Trigger finger, right middle finger  Trigger thumb of both hands  Muscle weakness (generalized)    Problem List Patient Active Problem List   Diagnosis Date Noted   TIA (transient ischemic attack) 09/05/2021   Dyslipidemia 09/05/2021   GERD (  gastroesophageal reflux disease) 09/05/2021   Cerebrovascular disease 12/11/2018   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, [place], [address] Phone: [phone]   Fax:  [phone]  Name: Emily Soto MRN: [id number] Date of Birth: [birth year]

## 2021-09-28 ENCOUNTER — Ambulatory Visit: Payer: BC Managed Care – PPO | Admitting: Occupational Therapy

## 2021-09-28 DIAGNOSIS — M65311 Trigger thumb, right thumb: Secondary | ICD-10-CM

## 2021-09-28 DIAGNOSIS — M25532 Pain in left wrist: Secondary | ICD-10-CM

## 2021-09-28 DIAGNOSIS — M654 Radial styloid tenosynovitis [de Quervain]: Secondary | ICD-10-CM

## 2021-09-28 DIAGNOSIS — M65331 Trigger finger, right middle finger: Secondary | ICD-10-CM

## 2021-09-28 DIAGNOSIS — M6281 Muscle weakness (generalized): Secondary | ICD-10-CM

## 2021-09-28 DIAGNOSIS — M79641 Pain in right hand: Secondary | ICD-10-CM

## 2021-09-28 NOTE — Therapy (Signed)
[facility] [address], [place], [address] Phone: [phone]   Fax:  [phone]  Occupational Therapy Treatment  Patient Details  Name: Emily Soto MRN: [id number] Date of Birth: 1960-09-29 Referring Provider (OT): Dr. Emily Filbert   Encounter Date: 09/28/2021   OT End of Session - 09/28/21 1743     Visit Number 9    Number of Visits 12    Date for OT Re-Evaluation 10/25/21    OT Start Time 9357    OT Stop Time 1714    OT Time Calculation (min) 59 min    Activity Tolerance Patient tolerated treatment well    Behavior During Therapy [facility] for tasks assessed/performed             Past Medical History:  Diagnosis Date   B12 deficiency    Endometrial cancer ([place])    GERD (gastroesophageal reflux disease)     No past surgical history on file.  There were no vitals filed for this visit.   Subjective Assessment - 09/28/21 1713     Subjective  I am doing better- can pull up my pants now with my R thumb without pain - pain better and my motion -but still wearing splints    Pertinent History Patient's endometrial cancer has been stable. Has not had a colonoscopy, did have a normal Cologuard a number of years ago. Has progressive right hand issues when see by Dr Sabra Heck in June - refer to Dr Amedeo Plenty - but pt do not want surgery - , particular middle finger trigger finger and thumb - as well as tenosynitivitis R thumb - pt refer to OT    Patient Stated Goals I want to avoid surgery with having cancer treatment.  I want the pain better so I can use my hands and the things I like to do.    Currently in Pain? Yes    Pain Score 2     Pain Location Hand    Pain Orientation Right;Left    Pain Descriptors / Indicators Tender                       Tolerating Ionto better - pt to keep patches on for hour afterwards    OT Treatments/Exercises (OP) - 09/28/21 0001       Ultrasound   Ultrasound  Location R3rd A1pulley    Ultrasound Parameters 3.65mz,20%, 0.8    Ultrasound Goals Pain      Iontophoresis   Type of Iontophoresis Dexamethasone    Location Right thumb A1 pulley, left first dorsal compartment    Dose Small patch right, medium patch- L 1.6 and R 2.0 current    Time 23      RUE Contrast Bath   Time 8 minutes    Comments prior to ROM and softi tissue      LUE Contrast Bath   Time 8 minutes    Comments priore to soft tissnd and ROM             Patient arrive with custom thumb spica on left wrist and thumb.  At nighttime fitted with a prefab thumb spica brace more comfortable . Pt can see if feels better with no splint on L hand at night time -if no pain can sleep without  But daytime custom thumb spica still on        Patient report increased flexibility in the right thumb as well as  middle finger.  and less triggering at thumb and increase functional use   Tenderness left first dorsal compartment 3/10 Wynn Maudlin 2/10 No pain with wrist active range of motion.   Left third digit flexion increased to MCP and PIP 90 degrees with less triggering in session- tenderness over the A1 pulley 2/10   Right thumb range of motion increased to 55 degrees, passive range of motion 60 a- increased opposition to fifth digit with lno triggering  Less tightness and triggering this date compared to her last visit.  In the right thumb and third digit.  No tenderness at L thumb A1pulley  Soft tissue mobs to volar thumbs and R 3rd digit- and radial wrist L  Webspace massage with thumb PA and RA AAROM stretch          OT Education - 09/28/21 1743     Education Details ionto, splint wear, contrast, joint protection education and modification    Person(s) Educated Patient    Methods Explanation;Demonstration;Tactile cues;Verbal cues;Handout    Comprehension Verbal cues required;Returned demonstration;Verbalized understanding              OT Short Term Goals -  08/30/21 1341       OT SHORT TERM GOAL #1   Title Patient to be independent and wearing of splints as well as doing home program to decrease pain and tenderness to less than 3/10 in left and right hand.    Baseline Left wrist and hand pain 7/10.  Right thumb 10/10 over A1 pulley as well as third digit 5/10.  No splints she has a prefab that is poor fitting and immobilizing the whole thumb on the left.  Very limited knowledge of home program    Time 4    Period Weeks    Status New    Target Date 09/27/21               OT Long Term Goals - 08/30/21 1342       OT LONG TERM GOAL #1   Title Patient right thumb and middle finger active range of motion improved to within functional limits with triggering less than 1 time a day to be able to perform ADLs and ADLs with pain less than 2/10    Baseline Right thumb and third digit pain is 5-10/10 with tenderness over the A1 pulleys no active range of motion at thumb IP, AROM at third digit but limited endrange.  During daily and in the morning.    Time 6    Period Weeks    Status New    Target Date 10/11/21      OT LONG TERM GOAL #2   Title Left thumb tenosynovitis pain decreased to less than 1/10 for patient to be wean out of thumb spica to a neoprene to be able to perform ADLs and IADLs with more ease.  .    Baseline Tenderness 7/10 over left thumb first dorsal compartment.  Decreased thumb palmar radial abduction with pain with radial abduction as well as endrange flexion extension    Time 8    Period Weeks    Status New    Target Date 10/25/21      OT LONG TERM GOAL #3   Title Patient's bilateral grip and prehension strength increased to within functional limits with no increase symptoms more than 2/10.    Baseline Not tested because of pain 10/10 with no IP flexion on the right and Duker vein tenderness over distal radius 7/10  and Wynn Maudlin 3/10.    Time 8    Period Weeks    Status New    Target Date 10/25/21                    Plan - 09/28/21 1744     Clinical Impression Statement Patient presented at OT evaluation with a diagnosis of right dominant handtrigger thumb 10/10 tenderness over A1 pulley and unable to do any active range of motion of the thumb IP.  Third trigger finger with tenderness 5/10 over A1 pulley with decreased endrange with some triggering during the day.  In the left first dorsal compartment tenosynovitis with a tenderness 7/10 over distal radius with a positive Finkelstein of 3/10 at eval.   patient had 7 sessions of ionto with dexamethasone since evaluation.  It was not consistent because of hospitalization with a possible TIA.  Patient tenderness over right thumb A1 pulley decreased to 2/10 with showing of 60 degrees of IP passive range of motion and active range of motion of 55 and less triggering .  Left first dorsal compartment tenderness improved to 3/10 with discomfort and 2/10 pain with Wynn Maudlin- 1/10 with thumb RA but no pain with wrist AROM. Patient continues with thumb spica  most of the day and prefab at nigh time - without less pain reported.   Right third digit still painful and triggering but less pain and increase AROM. -PIP and MC 90.    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  Patient can benefit from continues skilled OT services to decrease pain and increase range of motion and strength without increasing triggering to be able to perform ADLs and IADLs.  Patient do want to avoid surgery due to cancer treatment.    OT Occupational Profile and History Problem Focused Assessment - Including review of records relating to presenting problem    Occupational performance deficits (Please refer to evaluation for details): ADL's;IADL's;Rest and Sleep;Play;Work;Leisure;Social Participation    Body Structure / Function / Physical Skills ADL;Strength;Pain;Dexterity;UE functional use;Edema;IADL;ROM;Flexibility;FMC;Decreased  knowledge of precautions    Rehab Potential Fair    Clinical Decision Making Limited treatment options, no task modification necessary    Comorbidities Affecting Occupational Performance: May have comorbidities impacting occupational performance    Modification or Assistance to Complete Evaluation  Min-Moderate modification of tasks or assist with assess necessary to complete eval    OT Frequency 2x / week    OT Duration 4 weeks    OT Treatment/Interventions Self-care/ADL training;Cryotherapy;Iontophoresis;Ultrasound;Fluidtherapy;Contrast Bath;Therapeutic exercise;Manual Therapy;Patient/family education;Passive range of motion;DME and/or AE instruction;Splinting    Consulted and Agree with Plan of Care Patient             Patient will benefit from skilled therapeutic intervention in order to improve the following deficits and impairments:   Body Structure / Function / Physical Skills: ADL, Strength, Pain, Dexterity, UE functional use, Edema, IADL, ROM, Flexibility, FMC, Decreased knowledge of precautions       Visit Diagnosis: Radial styloid tenosynovitis  Pain in right hand  Pain in left wrist  Trigger finger, right middle finger  Trigger thumb of both hands  Muscle weakness (generalized)    Problem List 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 [place], [address] Phone: [phone]   Fax:  [phone]  Name: Jakerria Kingbird MRN: [id number] Date of Birth: 26-Aug-1960

## 2021-10-04 ENCOUNTER — Ambulatory Visit: Payer: BC Managed Care – PPO | Admitting: Occupational Therapy

## 2021-10-04 DIAGNOSIS — M65311 Trigger thumb, right thumb: Secondary | ICD-10-CM

## 2021-10-04 DIAGNOSIS — M65331 Trigger finger, right middle finger: Secondary | ICD-10-CM

## 2021-10-04 DIAGNOSIS — M654 Radial styloid tenosynovitis [de Quervain]: Secondary | ICD-10-CM

## 2021-10-04 DIAGNOSIS — M25532 Pain in left wrist: Secondary | ICD-10-CM

## 2021-10-04 DIAGNOSIS — M79641 Pain in right hand: Secondary | ICD-10-CM

## 2021-10-04 DIAGNOSIS — M6281 Muscle weakness (generalized): Secondary | ICD-10-CM

## 2021-10-04 NOTE — Therapy (Signed)
[facility] [address] [place], [address] Phone: [phone]   Fax:  [phone]  Occupational Therapy Treatment/10th visit  Patient Details  Name: Emily Soto MRN: [id number] Date of Birth: [birth year] Referring Provider (OT): Dr. Emily Filbert   Encounter Date: 10/04/2021   OT End of Session - 10/04/21 1730     Visit Number 10    Number of Visits 12    Date for OT Re-Evaluation 10/25/21    OT Start Time 3299    OT Stop Time 1635    OT Time Calculation (min) 65 min    Activity Tolerance Patient tolerated treatment well    Behavior During Therapy [facility] for tasks assessed/performed             Past Medical History:  Diagnosis Date   B12 deficiency    Endometrial cancer ([place])    GERD (gastroesophageal reflux disease)     No past surgical history on file.  There were no vitals filed for this visit.   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  But look my right thumb is bending better not getting as much.  The middle finger is bothering me a little bit more than the thumb now but better.  And then I forgot my brace Sunday in church in the left wrist bothers me some.    Pertinent History Patient's endometrial cancer has been stable. Has not had a colonoscopy, did have a normal Cologuard a number of years ago. Has progressive right hand issues when see by Dr Sabra Heck in June - refer to Dr Amedeo Plenty - but pt do not want surgery - , particular middle finger trigger finger and thumb - as well as tenosynitivitis R thumb - pt refer to OT    Patient Stated Goals I want to avoid surgery with having cancer treatment.  I want the pain better so I can use my hands and the things I like to do.    Currently in Pain? Yes    Pain Score 3     Pain Location Hand    Pain Orientation Right    Pain Descriptors / Indicators  Tender;Tightness    Pain Type Chronic pain;Acute pain    Pain Onset More than a month ago    Pain Frequency Occasional                [facility] OT Assessment - 10/04/21 0001       Right Hand AROM   R Thumb MCP 0-60 70 Degrees    R Thumb IP 0-80 55 Degrees    R Index  MCP 0-90 80 Degrees    R Index PIP 0-100 100 Degrees    R Long  MCP 0-90 80 Degrees    R Long PIP 0-100 100 Degrees                    Skin check done no issues patient tolerated ionto very well patient to keep patch on for hour afterwards.    OT Treatments/Exercises (OP) - 10/04/21 0001       Ultrasound   Ultrasound Location R thumb A1pulley    Ultrasound Parameters 3.61mz,20% 0.8 intenstiy    Ultrasound Goals Pain      Iontophoresis   Type of Iontophoresis Dexamethasone    Location Right 3rd digitA1 pulley,  left first dorsal compartment    Dose med patch. 2.0 current    Time 20              Patient arrive with custom thumb spica on left wrist and thumb.  At nighttime fitted with a prefab thumb spica brace more comfortable . Pt can see if feels better with no splint on L hand at night time -if no pain can sleep without  But daytime custom thumb spica still on   Patient to report she was wearing it since last time about 50 to 75% of the time.  Try to sleep with no splint but did wear one last night but had increased pain.       Patient report increased flexibility in the right thumb as well as middle finger.  and less triggering at thumb and increase functional use   Tenderness left first dorsal compartment 1-3/10 Wynn Maudlin 1/10 No pain with wrist active range of motion.  Just slight pull over dorsal wrist in endrange flexion Pain with resistance of thumb palmar radial abduction   Left third digit flexion increased to MCP and PIP 90 degrees with less triggering i reported tenderness over the A1 pulley 4/10   Right thumb range of motion increased t at IP and [facility] see flowsheet.   Opposition to fifth digit with lno triggering  Reports less tightness and stiffness as well as triggering in right thumb with increased use at school.   Soft tissue mobs to volar thumbs and R 3rd digit- and radial wrist L  Webspace massage with thumb PA and RA AAROM stretch Change for patient to get ionto on right third digit and left first dorsal compartment.  And switch to ultrasound on A1 pulley of the right thumb          OT Education - 10/04/21 1730     Education Details ionto, splint wear, contrast, joint protection education and modification    Person(s) Educated Patient    Methods Explanation;Demonstration;Tactile cues;Verbal cues;Handout    Comprehension Verbal cues required;Returned demonstration;Verbalized understanding              OT Short Term Goals - 10/04/21 1734       OT SHORT TERM GOAL #1   Title Patient to be independent and wearing of splints as well as doing home program to decrease pain and tenderness to less than 3/10 in left and right hand.    Baseline Left wrist and hand pain 7/10.  Right thumb 10/10 over A1 pulley as well as third digit 5/10.  No splints she has a prefab that is poor fitting and immobilizing the whole thumb on the left.  Very limited knowledge of home program - NOW decrease triggering increased motion with wearing of splints and ionto and ultrasound.  Tenderness and pain less than 3/10 pain    Status Achieved               OT Long Term Goals - 10/04/21 1734       OT LONG TERM GOAL #1   Title Patient right thumb and middle finger active range of motion improved to within functional limits with triggering less than 1 time a day to be able to perform ADLs and ADLs with pain less than 2/10    Baseline Right thumb and third digit pain is 5-10/10 with tenderness over the A1 pulleys no active range of motion at thumb IP, AROM at third digit but limited endrange.  During daily and  in the morning. NOW right thumb active range of motion  increased greatly with occasional triggering.  Tenderness 1/10 over A1 pulley, third digit did not address with ionto yet flexion increased to 98 MC and 90 at PIP but still some triggering with repetitive fisting.    Time 2    Period Weeks    Status On-going    Target Date 10/11/21      OT LONG TERM GOAL #2   Title Left thumb tenosynovitis pain decreased to less than 1/10 for patient to be wean out of thumb spica to a neoprene to be able to perform ADLs and IADLs with more ease.  .    Baseline Tenderness 7/10 over left thumb first dorsal compartment.  Decreased thumb palmar radial abduction with pain with radial abduction as well as endrange flexion extension NOW still tenderness better 2-3/10 over first dorsal compartment pain mostly with resistance to thumb palmar radial abduction with a slight pull in wrist flexion.  Patient still wearing thumb spica more than 50 to 75% of the time.    Time 4    Period Weeks    Status On-going    Target Date 10/25/21      OT LONG TERM GOAL #3   Title Patient's bilateral grip and prehension strength increased to within functional limits with no increase symptoms more than 2/10.    Baseline Not tested because of pain 10/10 with no IP flexion on the right and Duker vein tenderness over distal radius 7/10 and Finkelstein 3/10. NOW NT since pain improved    Time 4    Period Weeks    Status On-going    Target Date 10/25/21                   Plan - 10/04/21 1731     Clinical Impression Statement Patient presented at OT evaluation with a diagnosis of right dominant handtrigger thumb 10/10 tenderness over A1 pulley and unable to do any active range of motion of the thumb IP.  Third trigger finger with tenderness 5/10 over A1 pulley with decreased endrange with some triggering during the day.  In the left first dorsal compartment tenosynovitis with a tenderness 7/10 over distal radius with a positive Finkelstein of 3/10 at eval.  .  Patient since  evaluation seen for 10 visits.  There was interruption after she went to the ED and was hospitalized with some numbness in the leg.  Patient made fantastic progress in decreased pain in the left first dorsal compartment with increased motion and pain.  Patient continues to be 2/3 tenderness over first dorsal compartment.  With some pain with thumb palmar radial abduction.  Right thumb flexibility increased greatly with occasional triggering now and tenderness 1/10 at A1 pulley.  Also great increase in motion in the third digit MCP and PIP at 90 degrees but A1 pulley did not had ionto yet and is about 3/10 tenderness.  Did initiate ionto to right third A1 pulley and continued with left first dorsal compartment.  Initiated ultrasound right thumb.  Plan is to decrease patient next week to 1 time a week and gradually wean out of splints into neoprene soft wrist thumb spica.  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  Patient can benefit from continues skilled OT services to decrease pain and increase range of motion and strength without increasing triggering to be able to perform ADLs and IADLs.  Patient do want to  avoid surgery due to cancer treatment.    OT Occupational Profile and History Problem Focused Assessment - Including review of records relating to presenting problem    Occupational performance deficits (Please refer to evaluation for details): ADL's;IADL's;Rest and Sleep;Play;Work;Leisure;Social Participation    Body Structure / Function / Physical Skills ADL;Strength;Pain;Dexterity;UE functional use;Edema;IADL;ROM;Flexibility;FMC;Decreased knowledge of precautions    Rehab Potential Fair    Clinical Decision Making Limited treatment options, no task modification necessary    Comorbidities Affecting Occupational Performance: May have comorbidities impacting occupational performance    Modification or Assistance to Complete Evaluation  Min-Moderate  modification of tasks or assist with assess necessary to complete eval    OT Frequency 2x / week    OT Duration 4 weeks    OT Treatment/Interventions Self-care/ADL training;Cryotherapy;Iontophoresis;Ultrasound;Fluidtherapy;Contrast Bath;Therapeutic exercise;Manual Therapy;Patient/family education;Passive range of motion;DME and/or AE instruction;Splinting    Consulted and Agree with Plan of Care Patient             Patient will benefit from skilled therapeutic intervention in order to improve the following deficits and impairments:   Body Structure / Function / Physical Skills: ADL, Strength, Pain, Dexterity, UE functional use, Edema, IADL, ROM, Flexibility, FMC, Decreased knowledge of precautions       Visit Diagnosis: Radial styloid tenosynovitis  Pain in right hand  Pain in left wrist  Trigger finger, right middle finger  Trigger thumb of both hands  Muscle weakness (generalized)    Problem List 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 [place], [address] Phone: [phone]   Fax:  [phone]  Name: Shewanda Sharpe MRN: [id number] Date of Birth: August 26, 1960

## 2021-10-06 ENCOUNTER — Ambulatory Visit: Payer: BC Managed Care – PPO | Admitting: Occupational Therapy

## 2021-10-06 DIAGNOSIS — M25532 Pain in left wrist: Secondary | ICD-10-CM

## 2021-10-06 DIAGNOSIS — M654 Radial styloid tenosynovitis [de Quervain]: Secondary | ICD-10-CM

## 2021-10-06 DIAGNOSIS — M79641 Pain in right hand: Secondary | ICD-10-CM

## 2021-10-06 DIAGNOSIS — M65331 Trigger finger, right middle finger: Secondary | ICD-10-CM

## 2021-10-06 DIAGNOSIS — M6281 Muscle weakness (generalized): Secondary | ICD-10-CM

## 2021-10-06 DIAGNOSIS — M65311 Trigger thumb, right thumb: Secondary | ICD-10-CM

## 2021-10-06 NOTE — Therapy (Signed)
[facility] [facility] [facility] [address], [place], [address] Phone: [phone]   Fax:  [phone]  Occupational Therapy Treatment  Patient Details  Name: Emily Soto MRN: [id number] Date of Birth: 10/24/1960 Referring Provider (OT): Dr. Emily Filbert   Encounter Date: 10/06/2021   OT End of Session - 10/06/21 1726     Visit Number 11    Number of Visits 12    Date for OT Re-Evaluation 10/25/21    OT Start Time 1615    OT Stop Time 1721    OT Time Calculation (min) 66 min    Activity Tolerance Patient tolerated treatment well    Behavior During Therapy [facility] for tasks assessed/performed             Past Medical History:  Diagnosis Date   B12 deficiency    Endometrial cancer ([place])    GERD (gastroesophageal reflux disease)     No past surgical history on file.  There were no vitals filed for this visit.   Subjective Assessment - 10/06/21 1721     Subjective  My thumb on the R still bending and using it more -but clicking some what again and then I marked where it hurts at my L wrist - but it feels good - much better- look my middle finger on the R hand is bending soo good    Pertinent History Patient's endometrial cancer has been stable. Has not had a colonoscopy, did have a normal Cologuard a number of years ago. Has progressive right hand issues when see by Dr Sabra Heck in June - refer to Dr Amedeo Plenty - but pt do not want surgery - , particular middle finger trigger finger and thumb - as well as tenosynitivitis R thumb - pt refer to OT    Patient Stated Goals I want to avoid surgery with having cancer treatment.  I want the pain better so I can use my hands and the things I like to do.    Currently in Pain? Yes    Pain Score 2     Pain Location Hand   radial wrist L   Pain Orientation Right;Left    Pain Descriptors / Indicators Tightness    Pain Onset More than a month ago                [facility] OT Assessment  - 10/06/21 0001       Right Hand AROM   R Long  MCP 0-90 90 Degrees    R Long PIP 0-100 100 Degrees                   Pt kept patch on for hour after leaving    OT Treatments/Exercises (OP) - 10/06/21 0001       Iontophoresis   Type of Iontophoresis Dexamethasone    Location Right 3rd digitA1 pulley, left first dorsal compartment    Dose Med patch L , and R small patch thumb and 3rd digit    Time 20      RUE Contrast Bath   Time 8 minutes    Comments prior to soft tissue and ROM      LUE Contrast Bath   Time 8 minutes    Comments prior to ROM and soft tissue            Patient arrive with custom thumb spica on left wrist and thumb.  At nighttime fitted with a prefab thumb spica brace  more comfortable . Pt can see if feels better with no splint on L hand at night time -if no pain can sleep without  But daytime custom thumb spica still on   Patient to report she was wearing it since last time about 50 to 75% of the time.  Try to sleep with no splint but did wear one last night but had increased pain.       Patient report increased flexibility in the right thumb as well as middle finger.  and less triggering at thumb and increase functional use   Tenderness left first dorsal compartment 1-3/10 [place] 1/10 No pain with wrist active range of motion.  Just slight pull over dorsal wrist in endrange flexion And with thumb RA    Left third digit flexion increased to MCP and PIP 90 degrees with less triggering i reported tenderness over the A1 pulley 2/10   Right thumb range of motion increased at IP and [facility] see flowsheet.  Pt report some triggering but when attempting IP flexion without blocking   Reports less tightness and stiffness as well as triggering in right thumb with increased use at school.   Soft tissue mobs to volar thumbs and R 3rd digit- and radial wrist L  Webspace massage with thumb PA and RA AAROM stretch Change for patient to get ionto on  right third digit  and thumb this date and left first dorsal compartment.         OT Education - 10/06/21 1723     Education Details ionto, splint wear, contrast, joint protection education and modification    Person(s) Educated Patient    Methods Explanation;Demonstration;Tactile cues;Verbal cues;Handout    Comprehension Verbal cues required;Returned demonstration;Verbalized understanding              OT Short Term Goals - 10/04/21 1734       OT SHORT TERM GOAL #1   Title Patient to be independent and wearing of splints as well as doing home program to decrease pain and tenderness to less than 3/10 in left and right hand.    Baseline Left wrist and hand pain 7/10.  Right thumb 10/10 over A1 pulley as well as third digit 5/10.  No splints she has a prefab that is poor fitting and immobilizing the whole thumb on the left.  Very limited knowledge of home program - NOW decrease triggering increased motion with wearing of splints and ionto and ultrasound.  Tenderness and pain less than 3/10 pain    Status Achieved               OT Long Term Goals - 10/04/21 1734       OT LONG TERM GOAL #1   Title Patient right thumb and middle finger active range of motion improved to within functional limits with triggering less than 1 time a day to be able to perform ADLs and ADLs with pain less than 2/10    Baseline Right thumb and third digit pain is 5-10/10 with tenderness over the A1 pulleys no active range of motion at thumb IP, AROM at third digit but limited endrange.  During daily and in the morning. NOW right thumb active range of motion increased greatly with occasional triggering.  Tenderness 1/10 over A1 pulley, third digit did not address with ionto yet flexion increased to 98 MC and 90 at PIP but still some triggering with repetitive fisting.    Time 2    Period Weeks  Status On-going    Target Date 10/11/21      OT LONG TERM GOAL #2   Title Left thumb tenosynovitis pain  decreased to less than 1/10 for patient to be wean out of thumb spica to a neoprene to be able to perform ADLs and IADLs with more ease.  .    Baseline Tenderness 7/10 over left thumb first dorsal compartment.  Decreased thumb palmar radial abduction with pain with radial abduction as well as endrange flexion extension NOW still tenderness better 2-3/10 over first dorsal compartment pain mostly with resistance to thumb palmar radial abduction with a slight pull in wrist flexion.  Patient still wearing thumb spica more than 50 to 75% of the time.    Time 4    Period Weeks    Status On-going    Target Date 10/25/21      OT LONG TERM GOAL #3   Title Patient's bilateral grip and prehension strength increased to within functional limits with no increase symptoms more than 2/10.    Baseline Not tested because of pain 10/10 with no IP flexion on the right and Duker vein tenderness over distal radius 7/10 and Finkelstein 3/10. NOW NT since pain improved    Time 4    Period Weeks    Status On-going    Target Date 10/25/21                   Plan - 10/06/21 1729     Clinical Impression Statement Patient presented at OT evaluation with a diagnosis of right dominant handtrigger thumb 10/10 tenderness over A1 pulley and unable to do any active range of motion of the thumb IP.  Third trigger finger with tenderness 5/10 over A1 pulley with decreased endrange with some triggering during the day.  In the left first dorsal compartment tenosynovitis with a tenderness 7/10 over distal radius with a positive Finkelstein of 3/10 at eval.  .  Patient since evaluation seen for 10 visits.  There was interruption after she went to the ED and was hospitalized with some numbness in the leg.  Patient made fantastic progress in decreased pain in the left first dorsal compartment with increased motion and pain.  Patient continues to be 2/3 tenderness over first dorsal compartment.  With some pain with thumb palmar  radial abduction.  Right thumb flexibility increased greatly with occasional triggering now and tenderness 1/10 at A1 pulley.  Also great increase in motion in the third digit MCP 90 and PIP 100 -.  Did initiate ionto to right third A1 pulley last visit and continued with left first dorsal compartment and thumb this date on R.  Initiated ultrasound right thumb.  Plan is to decrease patient next week to 1 time a week and gradually wean out of splints into neoprene soft wrist thumb spica.  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  Patient can benefit from continues skilled OT services to decrease pain and increase range of motion and strength without increasing triggering to be able to perform ADLs and IADLs.  Patient do want to avoid surgery due to cancer treatment.    OT Occupational Profile and History Problem Focused Assessment - Including review of records relating to presenting problem    Occupational performance deficits (Please refer to evaluation for details): ADL's;IADL's;Rest and Sleep;Play;Work;Leisure;Social Participation    Body Structure / Function / Physical Skills ADL;Strength;Pain;Dexterity;UE functional use;Edema;IADL;ROM;Flexibility;FMC;Decreased knowledge of precautions    Rehab Potential  Fair    Clinical Decision Making Limited treatment options, no task modification necessary    Comorbidities Affecting Occupational Performance: May have comorbidities impacting occupational performance    Modification or Assistance to Complete Evaluation  Min-Moderate modification of tasks or assist with assess necessary to complete eval    OT Frequency 2x / week    OT Duration 4 weeks    OT Treatment/Interventions Self-care/ADL training;Cryotherapy;Iontophoresis;Ultrasound;Fluidtherapy;Contrast Bath;Therapeutic exercise;Manual Therapy;Patient/family education;Passive range of motion;DME and/or AE instruction;Splinting    Consulted and Agree with Plan of Care  Patient             Patient will benefit from skilled therapeutic intervention in order to improve the following deficits and impairments:   Body Structure / Function / Physical Skills: ADL, Strength, Pain, Dexterity, UE functional use, Edema, IADL, ROM, Flexibility, FMC, Decreased knowledge of precautions       Visit Diagnosis: Radial styloid tenosynovitis  Pain in right hand  Pain in left wrist  Trigger finger, right middle finger  Trigger thumb of both hands  Muscle weakness (generalized)    Problem List 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 [place], [address] Phone: [phone]   Fax:  [phone]  Name: Emily Soto MRN: [id number] Date of Birth: Jan 12, 1961

## 2021-10-11 ENCOUNTER — Ambulatory Visit: Payer: BC Managed Care – PPO | Admitting: Occupational Therapy

## 2021-10-11 DIAGNOSIS — M654 Radial styloid tenosynovitis [de Quervain]: Secondary | ICD-10-CM | POA: Diagnosis not present

## 2021-10-11 DIAGNOSIS — M65331 Trigger finger, right middle finger: Secondary | ICD-10-CM

## 2021-10-11 DIAGNOSIS — M65311 Trigger thumb, right thumb: Secondary | ICD-10-CM

## 2021-10-11 DIAGNOSIS — M79641 Pain in right hand: Secondary | ICD-10-CM

## 2021-10-11 DIAGNOSIS — M6281 Muscle weakness (generalized): Secondary | ICD-10-CM

## 2021-10-11 DIAGNOSIS — M65312 Trigger thumb, left thumb: Secondary | ICD-10-CM

## 2021-10-11 DIAGNOSIS — M25532 Pain in left wrist: Secondary | ICD-10-CM

## 2021-10-11 NOTE — Therapy (Signed)
[place] [facility] [address], [place], [address] Phone: [phone]   Fax:  [phone]  Occupational Therapy Treatment  Patient Details  Name: Emily Soto MRN: [id number] Date of Birth: [birth year] Referring Provider (OT): Dr. Emily Filbert   Encounter Date: 10/11/2021   OT End of Session - 10/11/21 1804     Visit Number 12    Number of Visits 12    Date for OT Re-Evaluation 10/25/21    OT Start Time 1533    OT Stop Time 1620    OT Time Calculation (min) 47 min    Activity Tolerance Patient tolerated treatment well    Behavior During Therapy [facility] for tasks assessed/performed             Past Medical History:  Diagnosis Date   B12 deficiency    Endometrial cancer ([place])    GERD (gastroesophageal reflux disease)     No past surgical history on file.  There were no vitals filed for this visit.   Subjective Assessment - 10/11/21 1802     Subjective  It is better. My thumb bending with less triggering and middle finger still stiff but coming along - pain better. L wrist was not bad I forgot my splint yesterday and was not to bad    Pertinent History Patient's endometrial cancer has been stable. Has not had a colonoscopy, did have a normal Cologuard a number of years ago. Has progressive right hand issues when see by Dr Sabra Heck in June - refer to Dr Amedeo Plenty - but pt do not want surgery - , particular middle finger trigger finger and thumb - as well as tenosynitivitis R thumb - pt refer to OT    Patient Stated Goals I want to avoid surgery with having cancer treatment.  I want the pain better so I can use my hands and the things I like to do.    Currently in Pain? Yes    Pain Score 1     Pain Location Hand    Pain Orientation Right;Left    Pain Descriptors / Indicators Tender    Pain Type Chronic pain    Pain Onset More than a month ago    Pain Frequency Occasional                           OT Treatments/Exercises (OP) - 10/11/21 0001       Moist Heat Therapy   Number Minutes Moist Heat 6 Minutes    Moist Heat Location --   R hand and L wrist /hand     Iontophoresis   Type of Iontophoresis Dexamethasone    Location Right 3rd digitA1 pulley, left first dorsal compartment    Dose med patch L 1st dorsal compartment , R 3rd and thumb A1pulley2.0 crrent on L ,and 1.3 on R    Time 20            Patient arrive with  prefab thumb spica brace more comfortable  during day and no splint night time - did had some pain pulling  up covers  Pt fitted with neoprene thumb and wrist wrap to use on and off during day with thumb spica 2 hrs on and off if pain free         Patient report increased flexibility in the right thumb as well as middle finger.  and less triggering at thumb and increase functional use  if doing her PROM first and few times during day    Tenderness left first dorsal compartment 1/10 Finkelstein 1/10 No pain with wrist active range of motion.  Just slight pull over dorsal wrist in endrange flexion And with thumb RA     Left third digit flexion increased to MCP and PIP 90 degrees with less triggering and reported tenderness over the A1 pulley 1/10 in session after soft tissue and ROM - 100 degrees at 3rd no pain   Right thumb range of motion increased at IP and [facility] see flowsheet.  no triggering and tenderness 1/10    Reports less tightness and stiffness as well as triggering in right thumb with increased use at school.   Soft tissue mobs to volar thumbs and R 3rd digit- and radial wrist L  Webspace massage with thumb PA and RA AAROM stretch               OT Education - 10/11/21 1804     Education Details ionto, splint wear, contrast, joint protection education and modification    Person(s) Educated Patient    Methods Explanation;Demonstration;Tactile cues;Verbal cues;Handout    Comprehension Verbal cues  required;Returned demonstration;Verbalized understanding              OT Short Term Goals - 10/04/21 1734       OT SHORT TERM GOAL #1   Title Patient to be independent and wearing of splints as well as doing home program to decrease pain and tenderness to less than 3/10 in left and right hand.    Baseline Left wrist and hand pain 7/10.  Right thumb 10/10 over A1 pulley as well as third digit 5/10.  No splints she has a prefab that is poor fitting and immobilizing the whole thumb on the left.  Very limited knowledge of home program - NOW decrease triggering increased motion with wearing of splints and ionto and ultrasound.  Tenderness and pain less than 3/10 pain    Status Achieved               OT Long Term Goals - 10/04/21 1734       OT LONG TERM GOAL #1   Title Patient right thumb and middle finger active range of motion improved to within functional limits with triggering less than 1 time a day to be able to perform ADLs and ADLs with pain less than 2/10    Baseline Right thumb and third digit pain is 5-10/10 with tenderness over the A1 pulleys no active range of motion at thumb IP, AROM at third digit but limited endrange.  During daily and in the morning. NOW right thumb active range of motion increased greatly with occasional triggering.  Tenderness 1/10 over A1 pulley, third digit did not address with ionto yet flexion increased to 98 MC and 90 at PIP but still some triggering with repetitive fisting.    Time 2    Period Weeks    Status On-going    Target Date 10/11/21      OT LONG TERM GOAL #2   Title Left thumb tenosynovitis pain decreased to less than 1/10 for patient to be wean out of thumb spica to a neoprene to be able to perform ADLs and IADLs with more ease.  .    Baseline Tenderness 7/10 over left thumb first dorsal compartment.  Decreased thumb palmar radial abduction with pain with radial abduction as well as endrange flexion extension NOW still tenderness better  2-3/10  over first dorsal compartment pain mostly with resistance to thumb palmar radial abduction with a slight pull in wrist flexion.  Patient still wearing thumb spica more than 50 to 75% of the time.    Time 4    Period Weeks    Status On-going    Target Date 10/25/21      OT LONG TERM GOAL #3   Title Patient's bilateral grip and prehension strength increased to within functional limits with no increase symptoms more than 2/10.    Baseline Not tested because of pain 10/10 with no IP flexion on the right and Duker vein tenderness over distal radius 7/10 and Finkelstein 3/10. NOW NT since pain improved    Time 4    Period Weeks    Status On-going    Target Date 10/25/21                   Plan - 10/11/21 1804     Clinical Impression Statement Patient presented at OT evaluation with a diagnosis of right dominant handtrigger thumb 10/10 tenderness over A1 pulley and unable to do any active range of motion of the thumb IP.  Third trigger finger with tenderness 5/10 over A1 pulley with decreased endrange with some triggering during the day.  In the left first dorsal compartment tenosynovitis with a tenderness 7/10 over distal radius with a positive Finkelstein of 3/10 at eval.  .  Patient since evaluation seen for 11 visits.  There was interruption after she went to the ED and was hospitalized with some numbness in the leg.  Patient made fantastic progress in decreased pain in the left first dorsal compartment and R thumb and 3rd digit A1pulley tenderness- this date 1/10 on all.  Pt show increase AROM in R thumb and 3rd digit with less triggering - wean pt this date into soft neoprene wrist and thumb wrap to use on L 2hrs on and off into thumb spica during day. If pain cont to improve and triggering - plan to decrease patient next week to 1 time a week and gradually wean out of splints.  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  Patient can benefit  from continues skilled OT services to decrease pain and increase range of motion and strength without increasing triggering to be able to perform ADLs and IADLs.  Patient do want to avoid surgery due to cancer treatment.    OT Occupational Profile and History Problem Focused Assessment - Including review of records relating to presenting problem    Occupational performance deficits (Please refer to evaluation for details): ADL's;IADL's;Rest and Sleep;Play;Work;Leisure;Social Participation    Body Structure / Function / Physical Skills ADL;Strength;Pain;Dexterity;UE functional use;Edema;IADL;ROM;Flexibility;FMC;Decreased knowledge of precautions    Rehab Potential Fair    Clinical Decision Making Limited treatment options, no task modification necessary    Comorbidities Affecting Occupational Performance: May have comorbidities impacting occupational performance    Modification or Assistance to Complete Evaluation  Min-Moderate modification of tasks or assist with assess necessary to complete eval    OT Frequency 2x / week    OT Duration 4 weeks    OT Treatment/Interventions Self-care/ADL training;Cryotherapy;Iontophoresis;Ultrasound;Fluidtherapy;Contrast Bath;Therapeutic exercise;Manual Therapy;Patient/family education;Passive range of motion;DME and/or AE instruction;Splinting    Consulted and Agree with Plan of Care Patient             Patient will benefit from skilled therapeutic intervention in order to improve the following deficits and impairments:   Body Structure /  Function / Physical Skills: ADL, Strength, Pain, Dexterity, UE functional use, Edema, IADL, ROM, Flexibility, FMC, Decreased knowledge of precautions       Visit Diagnosis: Pain in right hand  Pain in left wrist  Trigger finger, right middle finger  Trigger thumb of both hands  Muscle weakness (generalized)  Radial styloid tenosynovitis    Problem List Patient Active Problem List   Diagnosis Date Noted    TIA (transient ischemic attack) 09/05/2021   Dyslipidemia 09/05/2021   GERD (gastroesophageal reflux disease) 09/05/2021   Cerebrovascular disease 12/11/2018   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 [place], [address] Phone: [phone]   Fax:  [phone]  Name: Emily Soto MRN: [id number] Date of Birth: 12-05-1960

## 2021-10-13 ENCOUNTER — Ambulatory Visit: Payer: BC Managed Care – PPO | Admitting: Occupational Therapy

## 2021-10-13 DIAGNOSIS — M25532 Pain in left wrist: Secondary | ICD-10-CM

## 2021-10-13 DIAGNOSIS — M65311 Trigger thumb, right thumb: Secondary | ICD-10-CM

## 2021-10-13 DIAGNOSIS — M654 Radial styloid tenosynovitis [de Quervain]: Secondary | ICD-10-CM | POA: Diagnosis not present

## 2021-10-13 DIAGNOSIS — M65331 Trigger finger, right middle finger: Secondary | ICD-10-CM

## 2021-10-13 DIAGNOSIS — M6281 Muscle weakness (generalized): Secondary | ICD-10-CM

## 2021-10-13 DIAGNOSIS — M79641 Pain in right hand: Secondary | ICD-10-CM

## 2021-10-13 NOTE — Therapy (Signed)
[facility] [address] [place], [address] Phone: [phone]   Fax:  [phone]  Occupational Therapy Treatment  Patient Details  Name: Emily Soto MRN: [id number] Date of Birth: [birth year] Referring Provider (OT): Dr. Emily Filbert   Encounter Date: 10/13/2021   OT End of Session - 10/13/21 1701     Visit Number 13    Number of Visits 16    Date for OT Re-Evaluation 11/17/21    OT Start Time 1616    OT Stop Time 1717    OT Time Calculation (min) 61 min    Activity Tolerance Patient tolerated treatment well    Behavior During Therapy [facility] for tasks assessed/performed             Past Medical History:  Diagnosis Date   B12 deficiency    Endometrial cancer ([place])    GERD (gastroesophageal reflux disease)     No past surgical history on file.  There were no vitals filed for this visit.   Subjective Assessment - 10/13/21 1654     Subjective  I wore the soft splint on and off with harder one - 2hrs on and off - doing great -but little pain with wrist going back - did not had any triggering in R thumb and middle finger - tenderness less than 1/10    Pertinent History Patient's endometrial cancer has been stable. Has not had a colonoscopy, did have a normal Cologuard a number of years ago. Has progressive right hand issues when see by Dr Sabra Heck in June - refer to Dr Amedeo Plenty - but pt do not want surgery - , particular middle finger trigger finger and thumb - as well as tenosynitivitis R thumb - pt refer to OT    Patient Stated Goals I want to avoid surgery with having cancer treatment.  I want the pain better so I can use my hands and the things I like to do.    Currently in Pain? Yes    Pain Score 1     Pain Location Hand    Pain Orientation Right;Left    Pain Descriptors / Indicators Tender    Pain Type Chronic pain;Acute pain    Pain Onset More than a month ago    Pain Frequency Occasional                 [facility] OT Assessment - 10/13/21 0001       Strength   Right Hand Grip (lbs) 50    Right Hand Lateral Pinch 16 lbs    Right Hand 3 Point Pinch 19 lbs    Left Hand Grip (lbs) 60    Left Hand Lateral Pinch 19 lbs    Left Hand 3 Point Pinch 17 lbs              Grip and prehension strength assessed patient into percentile for age pain-free.        Skin check done prior to ionto no issues tolerating well patient can keep patches on for hour afterwards.   OT Treatments/Exercises (OP) - 10/13/21 0001       Moist Heat Therapy   Number Minutes Moist Heat 6 Minutes    Moist Heat Location --   R hand , L wrist - prior to soft tissue and ROM     Iontophoresis   Type of Iontophoresis Dexamethasone    Location R thumb A1pulley , L 1st dorsal compartment  Dose med patch L 1st dorsal compartment , R 3rd and thumb A1pulley2.0 crrent on L ,and 1.3 on R    Time 20            Patient arrive with thumb and wrist wrap neoprene in place was wearing it 2 hours on and off with thumb spica today.   Can continue to increase time in thumb neoprene over the next week with less time in thumb spica if pain-free.      Patient report increased flexibility in the right thumb as well as middle finger.  and and only 1 time of triggering in middle finger since last seen with increased range of motion continuing and less tenderness  Increase functional use of right and left hand at home and at school.  Tenderness left first dorsal compartment 1/10 Finkelstein 1/10 No pain with wrist active range of motion.  Just slight pull over dorsal wrist in endrange wrist extension      Left third digit flexion increased to MCP and PIP 90 degrees with less triggering and reported tenderness over the A1 pulley 1/10 in session after soft tissue and ROM - 100 degrees at 3rd no pain   Right thumb range of motion increased at IP and [facility] see flowsheet.  no triggering and tenderness 1/10    Reports  less tightness and stiffness as well as triggering in right thumb with increased use at school.   Soft tissue mobs to volar thumbs and R 3rd digit- and radial wrist L  Webspace massage with thumb PA and RA AAROM stretch  Patient to continue with modifications and joint protection.                    OT Education - 10/13/21 1701     Education Details ionto, splint wear, contrast, joint protection education and modification    Person(s) Educated Patient    Methods Explanation;Demonstration;Tactile cues;Verbal cues;Handout    Comprehension Verbal cues required;Returned demonstration;Verbalized understanding              OT Short Term Goals - 10/13/21 1736       OT SHORT TERM GOAL #1   Title Patient to be independent and wearing of splints as well as doing home program to decrease pain and tenderness to less than 3/10 in left and right hand.    Status Achieved               OT Long Term Goals - 10/13/21 1736       OT LONG TERM GOAL #1   Title Patient right thumb and middle finger active range of motion improved to within functional limits with triggering less than 1 time a day to be able to perform ADLs and ADLs with pain less than 2/10    Status Achieved      OT LONG TERM GOAL #2   Title Left thumb tenosynovitis pain decreased to less than 1/10 for patient to be wean out of thumb spica to a neoprene to be able to perform ADLs and IADLs with more ease.  .    Baseline Pain less than a 1/10 patient weaning 2 hours on 2 hours off out of thumb spica into thumb wrist wrap.  Continue to wean patient time more out of thumb spica and will monitor if she can go out of thumb and wrist wrap.    Time 5    Period Weeks    Status On-going    Target Date  11/17/21      OT LONG TERM GOAL #3   Title Patient's bilateral grip and prehension strength increased to within functional limits with no increase symptoms more than 2/10.    Baseline Fantastic grip and prehension strength  pain-free and with upper percentile for age    Status Achieved                   Plan - 10/13/21 1701     Clinical Impression Statement Patient presented at OT evaluation with a diagnosis of right dominant handtrigger thumb 10/10 tenderness over A1 pulley and unable to do any active range of motion of the thumb IP.  Third trigger finger with tenderness 5/10 over A1 pulley with decreased endrange with some triggering during the day.  In the left first dorsal compartment tenosynovitis with a tenderness 7/10 over distal radius with a positive Finkelstein of 3/10 at eval.  .  Patient since evaluation seen for 11 visits.  There was interruption after she went to the ED and was hospitalized with some numbness in the leg.  Patient made great progress in right thumb and third digit tenderness over A1 pulley as well as triggering and range of motion.  Patient now I have-1/10 tenderness with active range of motion within functional limits.  Left first dorsal compartment pain appears more over ECU now with some discomfort at wrist extension but not frequent.  Patient was weaned to a soft wrist and thumb wrap to wear on and off 2 hours with thumb spica.  Patient decreased to 1 time a week and then will be every other week for 3-4 more visits.  Patient grip and prehension strength improved greatly and is in upper percentile for her age.  Patient can wean over the next week more out of her thumb spica into the thumb wrist wrap.  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  Patient can benefit from continues skilled OT services to monitor if she can maintain progress in triggering as well as first dorsal compartment pain  Patient do want to avoid surgery due to cancer treatment.    OT Occupational Profile and History Problem Focused Assessment - Including review of records relating to presenting problem    Occupational performance deficits (Please refer to evaluation for  details): ADL's;IADL's;Rest and Sleep;Play;Work;Leisure;Social Participation    Body Structure / Function / Physical Skills ADL;Strength;Pain;Dexterity;UE functional use;Edema;IADL;ROM;Flexibility;FMC;Decreased knowledge of precautions    Rehab Potential Fair    Clinical Decision Making Limited treatment options, no task modification necessary    Comorbidities Affecting Occupational Performance: May have comorbidities impacting occupational performance    Modification or Assistance to Complete Evaluation  Min-Moderate modification of tasks or assist with assess necessary to complete eval    OT Frequency 1x / week    OT Duration 6 weeks    OT Treatment/Interventions Self-care/ADL training;Cryotherapy;Iontophoresis;Ultrasound;Fluidtherapy;Contrast Bath;Therapeutic exercise;Manual Therapy;Patient/family education;Passive range of motion;DME and/or AE instruction;Splinting    Consulted and Agree with Plan of Care Patient             Patient will benefit from skilled therapeutic intervention in order to improve the following deficits and impairments:   Body Structure / Function / Physical Skills: ADL, Strength, Pain, Dexterity, UE functional use, Edema, IADL, ROM, Flexibility, FMC, Decreased knowledge of precautions       Visit Diagnosis: Pain in right hand - Plan: Ot plan of care cert/re-cert  Pain in left wrist - Plan: Ot plan of  care cert/re-cert  Trigger finger, right middle finger - Plan: Ot plan of care cert/re-cert  Trigger thumb of both hands - Plan: Ot plan of care cert/re-cert  Muscle weakness (generalized) - Plan: Ot plan of care cert/re-cert  Radial styloid tenosynovitis - Plan: Ot plan of care cert/re-cert    Problem List Patient Active Problem List   Diagnosis Date Noted   TIA (transient ischemic attack) 09/05/2021   Dyslipidemia 09/05/2021   GERD (gastroesophageal reflux disease) 09/05/2021   Cerebrovascular disease 12/11/2018   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 [place], [address] Phone: [phone]   Fax:  [phone]  Name: Emily Soto MRN: [id number] Date of Birth: June 02, 1960

## 2021-10-20 ENCOUNTER — Ambulatory Visit: Payer: BC Managed Care – PPO | Attending: Internal Medicine | Admitting: Occupational Therapy

## 2021-10-20 DIAGNOSIS — M79641 Pain in right hand: Secondary | ICD-10-CM | POA: Diagnosis present

## 2021-10-20 DIAGNOSIS — M65312 Trigger thumb, left thumb: Secondary | ICD-10-CM | POA: Insufficient documentation

## 2021-10-20 DIAGNOSIS — M65311 Trigger thumb, right thumb: Secondary | ICD-10-CM | POA: Diagnosis present

## 2021-10-20 DIAGNOSIS — M654 Radial styloid tenosynovitis [de Quervain]: Secondary | ICD-10-CM | POA: Diagnosis present

## 2021-10-20 DIAGNOSIS — M65331 Trigger finger, right middle finger: Secondary | ICD-10-CM | POA: Diagnosis present

## 2021-10-20 DIAGNOSIS — M25532 Pain in left wrist: Secondary | ICD-10-CM | POA: Diagnosis present

## 2021-10-20 DIAGNOSIS — M6281 Muscle weakness (generalized): Secondary | ICD-10-CM | POA: Insufficient documentation

## 2021-10-20 DIAGNOSIS — R262 Difficulty in walking, not elsewhere classified: Secondary | ICD-10-CM | POA: Diagnosis present

## 2021-10-20 NOTE — Therapy (Signed)
[place] [facility] [address] [place], [address] Phone: [phone]   Fax:  [phone]  Occupational Therapy Treatment  Patient Details  Name: Emily Soto MRN: [id number] Date of Birth: 11/29/1960 Referring Provider (OT): Dr. Emily Filbert   Encounter Date: 10/20/2021   OT End of Session - 10/20/21 2116     Visit Number 14    Number of Visits 16    Date for OT Re-Evaluation 11/17/21    OT Start Time 1616    OT Stop Time 1700    OT Time Calculation (min) 44 min    Activity Tolerance Patient tolerated treatment well    Behavior During Therapy [facility] for tasks assessed/performed             Past Medical History:  Diagnosis Date   B12 deficiency    Endometrial cancer ([place])    GERD (gastroesophageal reflux disease)     No past surgical history on file.  There were no vitals filed for this visit.   Subjective Assessment - 10/20/21 2114     Subjective  My right hand thumb and middle finger is doing great.  Did not had any triggering since have seen you.  Little stiff but pain also better.  The left wrist that may be felt about 3 times like a shooting pain since have seen you last.  Is just range of motion.    Pertinent History Patient's endometrial cancer has been stable. Has not had a colonoscopy, did have a normal Cologuard a number of years ago. Has progressive right hand issues when see by Dr Sabra Heck in June - refer to Dr Amedeo Plenty - but pt do not want surgery - , particular middle finger trigger finger and thumb - as well as tenosynitivitis R thumb - pt refer to OT    Patient Stated Goals I want to avoid surgery with having cancer treatment.  I want the pain better so I can use my hands and the things I like to do.    Currently in Pain? Yes    Pain Score 3     Pain Location Wrist    Pain Orientation Left    Pain Descriptors / Indicators Tender    Pain Type Acute pain;Chronic pain    Pain Onset More than a  month ago    Pain Frequency Occasional                [facility] OT Assessment - 10/20/21 0001       Strength   Right Hand Grip (lbs) 50    Right Hand Lateral Pinch 17 lbs    Right Hand 3 Point Pinch 19 lbs    Left Hand Grip (lbs) 60    Left Hand Lateral Pinch 19 lbs    Left Hand 3 Point Pinch 17 lbs      Right Hand AROM   R Thumb MCP 0-60 70 Degrees    R Thumb IP 0-80 55 Degrees    R Thumb Opposition to Index --   Position to distal palmar crease with no triggering   R Long  MCP 0-90 90 Degrees    R Long PIP 0-100 100 Degrees               No triggering reported in the right hand since last seen.  Patient to continue with passive range of motion to thumb IP and combined flexion prior to opposition. Same with right third digit passive  range of motion to DIP PIP and intrinsic a fist prior to composite.  And active motion.   Patient still have tenderness over distal radius head but more towards abductor pollicis longus.  Patient had a random shooting pain about 3 times since last seen a week ago.     Skin check done prior to iontophoresis patient tolerating well no issues patient to keep patch on for hour afterwards.   OT Treatments/Exercises (OP) - 10/20/21 0001       Iontophoresis   Type of Iontophoresis Dexamethasone    Location Left first dorsal compartment    Dose Medium patch at 2.0 current    Time 20            Patient arrive with thumb and wrist wrap neoprene in place -reports she has been wearing that mostly during the day and at night. Recommend for patient she can start decreasing wearing of wrist and thumb wrap to no splint 2 hours on 2 hours off and gradually increase of the time.  If pain-free.     Increase functional use of right and left hand at home and at school. Reports he could write for longer.  Of time with a regular pen and pencil with the right hand at school with no issues.   1/10 pain with thumb palmar abduction with resistance and  radial deviation. No pain with wrist active range of motion.  Tenderness over distal radius head more towards abductor pollicis longus.     Right third digit active range of motion within normal limits grip and prehension improved.   Right thumb active range of motion within functional limits to distal palmar crease with no triggering or pain.       Patient to continue with modifications and joint protection.          OT Education - 10/20/21 2115     Education Details ionto, splint wear, contrast, joint protection education and modification    Person(s) Educated Patient    Methods Explanation;Demonstration;Tactile cues;Verbal cues;Handout    Comprehension Verbal cues required;Returned demonstration;Verbalized understanding              OT Short Term Goals - 10/13/21 1736       OT SHORT TERM GOAL #1   Title Patient to be independent and wearing of splints as well as doing home program to decrease pain and tenderness to less than 3/10 in left and right hand.    Status Achieved               OT Long Term Goals - 10/13/21 1736       OT LONG TERM GOAL #1   Title Patient right thumb and middle finger active range of motion improved to within functional limits with triggering less than 1 time a day to be able to perform ADLs and ADLs with pain less than 2/10    Status Achieved      OT LONG TERM GOAL #2   Title Left thumb tenosynovitis pain decreased to less than 1/10 for patient to be wean out of thumb spica to a neoprene to be able to perform ADLs and IADLs with more ease.  .    Baseline Pain less than a 1/10 patient weaning 2 hours on 2 hours off out of thumb spica into thumb wrist wrap.  Continue to wean patient time more out of thumb spica and will monitor if she can go out of thumb and wrist wrap.    Time  5    Period Weeks    Status On-going    Target Date 11/17/21      OT LONG TERM GOAL #3   Title Patient's bilateral grip and prehension strength increased to  within functional limits with no increase symptoms more than 2/10.    Baseline Fantastic grip and prehension strength pain-free and with upper percentile for age    Status Achieved                   Plan - 10/20/21 2116     Clinical Impression Statement Patient presented at OT evaluation with a diagnosis of right dominant handtrigger thumb 10/10 tenderness over A1 pulley and unable to do any active range of motion of the thumb IP.  Third trigger finger with tenderness 5/10 over A1 pulley with decreased endrange with some triggering during the day.  In the left first dorsal compartment tenosynovitis with a tenderness 7/10 over distal radius with a positive Wynn Maudlin of 3/10 at eval. patient made great progress in right hand thumb and third digit triggering with less than a 1/10 tenderness over A1 pulley.  As well as active range of motion and third digit to within normal limits but continued to recommend doing passive range of motion.  Right thumb active range of motion within functional limits.  Patient report did not had any triggering since seen a week ago.  Left first dorsal compartment pain improved greatly.  Patient do feel shooting pains randomly about 3 times since last seen but appears to be more over abductor Pollick is longus and with random activity.  Since last time patient weaned out of thumb spica into neoprene thumb and wrist wrap.  Recommend for patient to now gradually decrease wearing of wrist and thumb wrap and no splint. Patient decreased to 1 time a week and then will be every other week as needed.  Patient grip and prehension strength improved greatly and is in upper percentile for her age.  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  Patient can benefit from continues skilled OT services to monitor if she can maintain progress in triggering as well as first dorsal compartment pain  Patient do want to avoid surgery due to cancer  treatment.    OT Occupational Profile and History Problem Focused Assessment - Including review of records relating to presenting problem    Occupational performance deficits (Please refer to evaluation for details): ADL's;IADL's;Rest and Sleep;Play;Work;Leisure;Social Participation    Body Structure / Function / Physical Skills ADL;Strength;Pain;Dexterity;UE functional use;Edema;IADL;ROM;Flexibility;FMC;Decreased knowledge of precautions    Rehab Potential Fair    Clinical Decision Making Limited treatment options, no task modification necessary    Comorbidities Affecting Occupational Performance: May have comorbidities impacting occupational performance    Modification or Assistance to Complete Evaluation  Min-Moderate modification of tasks or assist with assess necessary to complete eval    OT Frequency 1x / week    OT Duration 6 weeks    OT Treatment/Interventions Self-care/ADL training;Cryotherapy;Iontophoresis;Ultrasound;Fluidtherapy;Contrast Bath;Therapeutic exercise;Manual Therapy;Patient/family education;Passive range of motion;DME and/or AE instruction;Splinting    Consulted and Agree with Plan of Care Patient             Patient will benefit from skilled therapeutic intervention in order to improve the following deficits and impairments:   Body Structure / Function / Physical Skills: ADL, Strength, Pain, Dexterity, UE functional use, Edema, IADL, ROM, Flexibility, FMC, Decreased knowledge of precautions  Visit Diagnosis: Pain in right hand  Pain in left wrist  Trigger finger, right middle finger  Trigger thumb of both hands  Muscle weakness (generalized)  Radial styloid tenosynovitis    Problem List 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 [place], [address] Phone: [phone]   Fax:  [phone]  Name: Emily Soto MRN: [id number] Date of Birth: 1960-04-18

## 2021-10-27 ENCOUNTER — Ambulatory Visit: Payer: BC Managed Care – PPO | Admitting: Occupational Therapy

## 2021-10-27 DIAGNOSIS — M65331 Trigger finger, right middle finger: Secondary | ICD-10-CM

## 2021-10-27 DIAGNOSIS — M65311 Trigger thumb, right thumb: Secondary | ICD-10-CM

## 2021-10-27 DIAGNOSIS — M79641 Pain in right hand: Secondary | ICD-10-CM

## 2021-10-27 DIAGNOSIS — M25532 Pain in left wrist: Secondary | ICD-10-CM

## 2021-10-27 DIAGNOSIS — M6281 Muscle weakness (generalized): Secondary | ICD-10-CM

## 2021-10-27 DIAGNOSIS — M654 Radial styloid tenosynovitis [de Quervain]: Secondary | ICD-10-CM

## 2021-10-27 NOTE — Therapy (Signed)
[facility] [address], [place], [address] Phone: [phone]   Fax:  [phone]  Occupational Therapy Treatment  Patient Details  Name: Destinie Thornsberry MRN: [id number] Date of Birth: 1960-03-28 Referring Provider (OT): Dr. Emily Filbert   Encounter Date: 10/27/2021   OT End of Session - 10/27/21 1614     Visit Number 15    Number of Visits 16    Date for OT Re-Evaluation 11/17/21    OT Start Time 7782    OT Stop Time 1629    OT Time Calculation (min) 41 min    Activity Tolerance Patient tolerated treatment well    Behavior During Therapy [facility] for tasks assessed/performed             Past Medical History:  Diagnosis Date   B12 deficiency    Endometrial cancer ([place])    GERD (gastroesophageal reflux disease)     No past surgical history on file.  There were no vitals filed for this visit.   Subjective Assessment - 10/27/21 1612     Subjective  Doing well - not issues on R hand thumb and middle finger - still stiff -but no pain or triggering. The L thumb and wrist was Sunday am hurting but I think I held my phone wrong Sat night doing my groceries shopping- but did better after that - still wearing my soft splint more than 50% but otherwise doing okay    Pertinent History Patient's endometrial cancer has been stable. Has not had a colonoscopy, did have a normal Cologuard a number of years ago. Has progressive right hand issues when see by Dr Sabra Heck in June - refer to Dr Amedeo Plenty - but pt do not want surgery - , particular middle finger trigger finger and thumb - as well as tenosynitivitis R thumb - pt refer to OT    Patient Stated Goals I want to avoid surgery with having cancer treatment.  I want the pain better so I can use my hands and the things I like to do.    Currently in Pain? Yes    Pain Score 1     Pain Location Wrist    Pain Orientation Left    Pain Descriptors / Indicators Tender    Pain  Type Acute pain    Pain Onset More than a month ago    Pain Frequency Occasional                [facility] OT Assessment - 10/27/21 0001       Strength   Right Hand Grip (lbs) 50    Right Hand Lateral Pinch 16 lbs    Right Hand 3 Point Pinch 18 lbs    Left Hand Grip (lbs) 56    Left Hand Lateral Pinch 20 lbs    Left Hand 3 Point Pinch 16 lbs      Right Hand AROM   R Thumb MCP 0-60 70 Degrees    R Thumb IP 0-80 55 Degrees    R Thumb Opposition to Index --   Opposition to base of 5th   R Long  MCP 0-90 90 Degrees    R Long PIP 0-100 100 Degrees            great progress in AROM and grip/prehension strength - see flowsheet No triggering reported in the right hand since last seen.  Patient to continue with passive range of motion to thumb IP and  combined flexion prior to opposition. Same with right third digit passive range of motion to DIP PIP and intrinsic a fist prior to composite.  And active motion.     Patient still have tenderness over L distal radius head but more towards abductor pollicis longus.  Pt had one episode of pain since seen last time - but used her phone to long.       Skin check done prior to iontophoresis patient tolerating well no issues patient to keep patch on for hour afterwards.             OT Treatments/Exercises (OP) - 10/27/21 0001       Iontophoresis   Type of Iontophoresis Dexamethasone    Location Left first dorsal compartment    Dose Medium patch at 2.0 current    Time 20            Patient arrive with thumb and wrist wrap neoprene in place -reports she has been wearing that mostly during the day - about 50% of time  and at night.   Increase functional use of right and left hand at home and at school. Reports he could write for longer.  Of time with a regular pen and pencil with the right hand at school with no issues.    L wrist 1/10 stretch or pull at times with thumb RA and UD  But otherwise pain free -and feel it  randomly   Right third digit active range of motion within normal limits grip and prehension improved.   Right thumb active range of motion within functional limits to distal palmar crease with no triggering or pain.         Patient to continue with modifications and joint protection.Will follow up with me as needed          OT Education - 10/27/21 1614     Education Details ionto, splint wear, contrast, joint protection education and modification    Person(s) Educated Patient    Methods Explanation;Demonstration;Tactile cues;Verbal cues;Handout    Comprehension Verbal cues required;Returned demonstration;Verbalized understanding              OT Short Term Goals - 10/13/21 1736       OT SHORT TERM GOAL #1   Title Patient to be independent and wearing of splints as well as doing home program to decrease pain and tenderness to less than 3/10 in left and right hand.    Status Achieved               OT Long Term Goals - 10/13/21 1736       OT LONG TERM GOAL #1   Title Patient right thumb and middle finger active range of motion improved to within functional limits with triggering less than 1 time a day to be able to perform ADLs and ADLs with pain less than 2/10    Status Achieved      OT LONG TERM GOAL #2   Title Left thumb tenosynovitis pain decreased to less than 1/10 for patient to be wean out of thumb spica to a neoprene to be able to perform ADLs and IADLs with more ease.  .    Baseline Pain less than a 1/10 patient weaning 2 hours on 2 hours off out of thumb spica into thumb wrist wrap.  Continue to wean patient time more out of thumb spica and will monitor if she can go out of thumb and wrist wrap.    Time  5    Period Weeks    Status On-going    Target Date 11/17/21      OT LONG TERM GOAL #3   Title Patient's bilateral grip and prehension strength increased to within functional limits with no increase symptoms more than 2/10.    Baseline Fantastic grip  and prehension strength pain-free and with upper percentile for age    Status Achieved                   Plan - 10/27/21 2013     Clinical Impression Statement Patient presented at OT evaluation with a diagnosis of right dominant handtrigger thumb 10/10 tenderness over A1 pulley and unable to do any active range of motion of the thumb IP.  Third trigger finger with tenderness 5/10 over A1 pulley with decreased endrange with some triggering during the day.  In the left first dorsal compartment tenosynovitis with a tenderness 7/10 over distal radius with a positive Wynn Maudlin of 3/10 at eval. patient made great progress in right hand thumb and third digit triggering with less than a 1/10 tenderness over A1 pulley.  As well as active range of motion and third digit WNL but continued to recommend doing passive range of motion.  Right thumb active range of motion within functional limits.  Patient report did not had any triggering since seen a week ago.  Left first dorsal compartment pain improved greatly. Pt cont to feel some stiffness in L thumb and had one episode of pain since seen week ago- but then got better. Wearing neoprene wrist and thumb wrap about 50% of time on L. Patient grip and prehension strength improved greatly and is in upper percentile for her age.  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  Patient to cont with her Homeprogram and will monitor her ability to maintain her progress - pt to follow up with me as needed.   Patient do want to avoid surgery due to cancer treatment.    OT Occupational Profile and History Problem Focused Assessment - Including review of records relating to presenting problem    Occupational performance deficits (Please refer to evaluation for details): ADL's;IADL's;Rest and Sleep;Play;Work;Leisure;Social Participation    Body Structure / Function / Physical Skills ADL;Strength;Pain;Dexterity;UE functional  use;Edema;IADL;ROM;Flexibility;FMC;Decreased knowledge of precautions    Rehab Potential Fair    Clinical Decision Making Limited treatment options, no task modification necessary    Comorbidities Affecting Occupational Performance: May have comorbidities impacting occupational performance    Modification or Assistance to Complete Evaluation  Min-Moderate modification of tasks or assist with assess necessary to complete eval    OT Frequency Biweekly    OT Duration 4 weeks    OT Treatment/Interventions Self-care/ADL training;Cryotherapy;Iontophoresis;Ultrasound;Fluidtherapy;Contrast Bath;Therapeutic exercise;Manual Therapy;Patient/family education;Passive range of motion;DME and/or AE instruction;Splinting    Consulted and Agree with Plan of Care Patient             Patient will benefit from skilled therapeutic intervention in order to improve the following deficits and impairments:   Body Structure / Function / Physical Skills: ADL, Strength, Pain, Dexterity, UE functional use, Edema, IADL, ROM, Flexibility, FMC, Decreased knowledge of precautions       Visit Diagnosis: Pain in right hand  Pain in left wrist  Trigger finger, right middle finger  Trigger thumb of both hands  Muscle weakness (generalized)  Radial styloid tenosynovitis    Problem List Patient Active Problem List   Diagnosis Date Noted   TIA (  transient ischemic attack) 09/05/2021   Dyslipidemia 09/05/2021   GERD (gastroesophageal reflux disease) 09/05/2021   Cerebrovascular disease 12/11/2018   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 [place], [address] Phone: [phone]   Fax:  [phone]  Name: Damisha Wolff MRN: [id number] Date of Birth: 1960/06/26

## 2021-11-07 ENCOUNTER — Ambulatory Visit: Payer: BC Managed Care – PPO | Admitting: Physical Therapy

## 2021-11-07 ENCOUNTER — Encounter: Payer: Self-pay | Admitting: Physical Therapy

## 2021-11-07 DIAGNOSIS — R262 Difficulty in walking, not elsewhere classified: Secondary | ICD-10-CM

## 2021-11-07 DIAGNOSIS — M6281 Muscle weakness (generalized): Secondary | ICD-10-CM

## 2021-11-07 DIAGNOSIS — M79641 Pain in right hand: Secondary | ICD-10-CM | POA: Diagnosis not present

## 2021-11-07 NOTE — Therapy (Signed)
OUTPATIENT PHYSICAL THERAPY EVALUATION   [name] MRN: [id number] DOB:[birth year], 61 y.o., female Today's Date: 11/07/2021   PT End of Session - 11/07/21 2000     Visit Number 1    Number of Visits 24    Date for PT Re-Evaluation 01/30/22    Authorization Type VL: Based on [place]    Progress Note Due on Visit 10    PT Start Time 1605    PT Stop Time 1645    PT Time Calculation (min) 40 min    Activity Tolerance Patient tolerated treatment well    Behavior During Therapy WFL for tasks assessed/performed             Past Medical History:  Diagnosis Date   B12 deficiency    Endometrial cancer ([place])    GERD (gastroesophageal reflux disease)    History reviewed. No pertinent surgical history. 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[place]) 12/11/2018   Left hand weakness 12/09/2018   Malignant neoplasm of endometrium ([place]) 10/28/2013    PCP: Rusty Aus, MD  REFERRING PROVIDER: Tresa Garter, PA  REFERRING DIAG: endometrial cancer, encounter for antineoplastic chemotherapy, deconditioning  Rationale for Evaluation and Treatment: Rehabilitation  THERAPY DIAG:  Muscle weakness (generalized)  Difficulty in walking, not elsewhere classified  ONSET DATE: 3 years ago with cancer recurrence diagnosis  SUBJECTIVE:                                                                                                                                                                                           SUBJECTIVE STATEMENT: Patient is here at PT for reconditioning after having endometrial cancer and being treated for it. Her cancer was first found in 2015. She had surgery and they thought they got it all, but 5 years later it returned and had  metastasized including to the lungs as well as other organs. She has been taking targeted therapy. She was on chemo for 6 months has been on Letrozole (anti-estrogen) and Everolimus for 2 years now (3 years since her cancer came back). She has had PT previously after she had chemo and it really helped her. Patient states she feels that she needs to start PT again to help her be as strong as possible. She has noticed her legs have been weaker. As she walks her legs get tired more quickly. She has been seeing OT for right thumb and third digit trigger finger  and left deQuervain's Tenosynovitis (that bothers her mostly on the weekend). She has achiness in her joints due to the side effect of medicine; pain is not her primary complaint. She may have had a TIA 09/05/2021 when her right LE became weak but resolved by 4 hours. She is getting an aortic CT in October. Heart monitor did not show anything. PCP thinks the leg weakness in the right LE may be from nerve irritation in her low back.  A year prior to having cancer she took a hard fall at school and tor her right hamstring. She did PT on it for a long time. COVID19 and cancer dx hit and it has not been discussed since. She feels it occasionally. She tries to walk more and be active at work. She uses the handrail to come down from second floor at home but goes up without handrails. She has been anemic for the last 2 years. She did have a TIA 3 years ago and that is how they found the cancer. Does report some paresthesias in parts of her body for about a minute at a time. Feeling stronger to her is being able to lift more and having more endurance.   PERTINENT HISTORY:  Patient is a 61 y.o. female who presents to outpatient physical therapy with a referral for medical diagnosis endometrial cancer, antineoplastic chemotherapy, deconditioning. This patient's chief complaints consist of deconditioning s/p cancer dx and treatment leading to the following functional  deficits: difficulty with activities that require strength and endurance including walking, standing, playing with grandchildren, lifting, floor transfers. Relevant past medical history and comorbidities include stroke (affected left arm temporarily), GERD, left hand weakness, dyslipidemia, B12 deficiency, endometrial cancer (stage IV), anemia.  Patient denies hx of seizures, lung problems, heart problems, diabetes, unexplained weight loss, unexplained changes in bowel or bladder problems, unexplained stumbling or dropping things, and spinal surgery, known osteoporosis.   PAIN:  Are you having pain? Yes: NPRS scale: Current: 0/10,  Best: 0/10, Worst: 10/10. Pain location:  joints, especially ankles and top of foot Pain description: achy Aggravating factors: wearing sandals Relieving factors: shoes that support   FUNCTIONAL LIMITATIONS: difficulty with activities that require strength and endurance including walking, standing, playing with grandchildren, floor transfers.   PRECAUTIONS: from OT: don't grab small things.   WEIGHT BEARING RESTRICTIONS No  FALLS:  Has patient fallen in last 6 months? No  LIVING ENVIRONMENT: Lives with: husband Lives in: 2 story home Stairs: 6 steps to get in 2 handrails cannot reach both; 15 steps inside with handrail on the left going up.  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   LEISURE: go see grandkids.   PLOF: Independent  PATIENT GOALS: To be "as strong as I can to help fight the cancer."   OBJECTIVE  OBSERVATION/INSPECTION Posture Posture (seated): mild forward head, rounded shoulders, increased thoracic kyphosis.  Posture (standing): mild bilateral genuvalgum,  Anthropometrics Tremor: none Body composition: obese Muscle bulk: no gross asymmetry noted Functional Mobility Bed mobility: supine <> sit and rolling WFL Transfers: sit <> stand  mod I for increased effort, request to use B UE.  Gait: grossly WFL for household and short community ambulation. More detailed gait analysis deferred to later date as needed.   SPINE MOTION  LUMBAR SPINE AROM *Indicates pain Flexion: fingers to ankles,  Extension: 50% Side Flexion:   R WNL  L WNL Rotation:  R WNL  L WNL  PERIPHERAL JOINT MOTION (in degrees)  ACTIVE RANGE OF MOTION (AROM) B UE grossly WFL for basic mobility except defer to OT for bilateral wrist and hand.   PASSIVE RANGE OF MOTION (PROM) B UE and LE grossly WFL for basic mobility.   MUSCLE PERFORMANCE (MMT):  *Indicates pain Date Date Date  Joint/Motion R/L R/L R/L  Shoulder     Flexion 4+/4+ / /  Abduction (C5) 4+/4 / /  External rotation 4+/4- / /  Internal rotation 5/5 / /  Elbow     Flexion (C6) 4/4 / /  Extension (C7) 5/5 / /  Hand     Thumb extension (C8) B WFL / /  Finger abduction (T1) B WFL / /  Hip     Flexion (L1, L2) 4-/4+ / /  Abduction 3/3+ / /  Knee     Extension (L3) 5/4+ / /  Flexion (S2) 4-*/4+ / /  Ankle/Foot     Dorsiflexion (L4) 4*/4+ / /  Great toe extension (L5) 4/4 / /  Eversion (S1) 4*/4+ / /  Plantarflexion (S1) 4+/4 / /  Comments:  11/07/2021: Difficulty walking on toes, able to heel walk.   SPECIAL TESTS:  LOWER LIMB NEURODYNAMIC TESTS Straight Leg Raise (Sciatic nerve)  R  = positive for concordant hamstring pain  L  = positive for back of knee pain  FUNCTIONAL/BALANCE TESTS: Five Time Sit to Stand (5TSTS): 10 seconds from 18.5 inch plinth with no UE support.   Lateral hip stability: Trendelenburg positive in SLS bilaterally   Single leg stance, firm surface, eyes open: R= 7 seconds, legs touching, trendelenburg present, L= 6 seconds, legs touching, trendelenburg present   TODAY'S TREATMENT  education   PATIENT EDUCATION:  Education details: Education on diagnosis, prognosis, POC, anatomy and physiology of current condition  Person educated:  Patient Education method: Explanation Education comprehension: verbalized understanding and needs further education   HOME EXERCISE PROGRAM: TBD  ASSESSMENT:  CLINICAL IMPRESSION: Patient is a 61 y.o. female referred to outpatient physical therapy with a medical diagnosis of endometrial cancer, antineoplastic chemotherapy, deconditioning who presents with signs and symptoms consistent with generalized deconditioning weakness following cancer diagnosis and treatment. Patient also appears to have R > L sciatic nerve tension and history of hamstring strain that is likely affecting her strength and stability R LE more than left. Patient has significant weakness of bilateral hip abductors that negatively impact her ability to perform activities that require single leg stance such as floor transfers, walking, playing with her grandchildren, etc.  Patient presents with significant pain, muscle tension, muscle performance (strength/power/endurance), balance, and activity tolerance impairments that are limiting ability to complete her usual activities that require strength and endurance including walking, standing, lifting, playing with grandchildren, floor transfers without difficulty. Patient will benefit from skilled physical therapy intervention to address current body structure impairments and activity limitations to improve function and work towards goals set in current POC in order to return to prior level of function or maximal functional improvement.    OBJECTIVE IMPAIRMENTS cardiopulmonary status limiting activity, decreased activity tolerance, decreased balance, decreased endurance, decreased mobility, difficulty walking, decreased strength, impaired perceived functional ability, impaired flexibility, obesity, and pain.   ACTIVITY LIMITATIONS carrying, lifting, standing, squatting, and stairs  PARTICIPATION LIMITATIONS: interpersonal relationship, community activity, and   activities that require  strength and endurance including walking, standing, playing with grandchildren, floor transfers, lifting.   PERSONAL FACTORS Past/current experiences, Time since onset of injury/illness/exacerbation, and 3+  comorbidities:    are also affecting patient's functional outcome.   REHAB POTENTIAL: Good  CLINICAL DECISION MAKING: Stable/uncomplicated  EVALUATION COMPLEXITY: Low   GOALS: Goals reviewed with patient? No  SHORT TERM GOALS: Target date: 11/21/2021  Patient will be independent with initial home exercise program for self-management of symptoms. Baseline: Initial HEP to be provided at visit 2 as appropriate (11/07/21); Goal status: INITIAL   LONG TERM GOALS: Target date: 01/30/2022  Patient will be independent with a long-term home exercise program for self-management of symptoms.  Baseline: Initial HEP to be provided at visit 2 as appropriate (11/07/21); Goal status: INITIAL  2.  Patient will demonstrate improved FOTO by equal or greater than 10 points to demonstrate improvement in overall condition and self-reported functional ability.  Baseline: to be tested at visit 2 as appropriate (11/07/21); Goal status: INITIAL  3.  Patient will demonstrate ability to perform single leg stance for equal or greater than 30 seconds with no trendelenburg on each side to improve her ability to complete activities that require single leg stance such as walking and floor transfers.  Baseline: R= 7 seconds, legs touching, trendelenburg present, L= 6 seconds, legs touching, trendelenburg present (11/07/21); Goal status: INITIAL  4.  Patient will demonstrate ability to ambulate equal or greater than aged matched norm of 1765 feet during 6 Minute Walk Test to improve her community mobility.  Baseline: to be tested at visit 2 as appropriate (11/07/21); Goal status: INITIAL  5.  Patient will complete community, work and/or recreational activities without limitation due to current condition.   Baseline: difficulty with activities that require strength and endurance including walking, standing, playing with grandchildren, floor transfers, lifting (11/07/21); Goal status: INITIAL  6.  Patient will complete Floor Transfer Test in equal or less than 8.8 seconds (cut off score for healthy elderly adult vs those with stroke) to demonstrate improved ability to get up and down from the floor to play with grandchildren.  Baseline: to be tested visit 2 as appropriate (11/07/2021);  Goal status: INITIAL  7.  Patient will complete floor to waist lift with 25# box 10 repetitions in a row to demonstrate improved ability to perform lifting at home.   Baseline: to be tested visit 2 as appropriate (11/07/2021);  Goal status: INITIAL  PLAN: PT FREQUENCY: 1-2x/week  PT DURATION: 12 weeks  PLANNED INTERVENTIONS: Therapeutic exercises, Therapeutic activity, Neuromuscular re-education, Balance training, Gait training, Patient/Family education, Self Care, Joint mobilization, Stair training, Cryotherapy, Moist heat, Manual therapy, and Re-evaluation.  PLAN FOR NEXT SESSION: update HEP as appropriate, functional, LE, and UE strengthening and balance exercises.   Everlean Alstrom. Graylon Good, PT, DPT 11/07/21, 8:01 PM  [facility] [address] [place], [place] [address] P: [phone] I F: [phone]

## 2021-11-08 NOTE — Addendum Note (Signed)
Addended by: Rosita Kea R on: 11/08/2021 10:29 AM   Modules accepted: Orders

## 2021-11-09 ENCOUNTER — Encounter: Payer: Self-pay | Admitting: Physical Therapy

## 2021-11-09 ENCOUNTER — Ambulatory Visit: Payer: BC Managed Care – PPO | Admitting: Physical Therapy

## 2021-11-09 DIAGNOSIS — M6281 Muscle weakness (generalized): Secondary | ICD-10-CM

## 2021-11-09 DIAGNOSIS — M79641 Pain in right hand: Secondary | ICD-10-CM | POA: Diagnosis not present

## 2021-11-09 DIAGNOSIS — R262 Difficulty in walking, not elsewhere classified: Secondary | ICD-10-CM

## 2021-11-09 NOTE — Therapy (Signed)
OUTPATIENT PHYSICAL THERAPY TREATMENT NOTE   [name] MRN: [id number] DOB:[birth year], 61 y.o., female Today's Date: 11/09/2021  PCP: Rusty Aus, MD REFERRING PROVIDER: Tresa Garter, PA  END OF SESSION:   PT End of Session - 11/09/21 1704     Visit Number 2    Number of Visits 24    Date for PT Re-Evaluation 01/30/22    Authorization Type VL: Based on [place]    Progress Note Due on Visit 10    PT Start Time 1605    PT Stop Time 1645    PT Time Calculation (min) 40 min    Activity Tolerance Patient tolerated treatment well    Behavior During Therapy WFL for tasks assessed/performed             Past Medical History:  Diagnosis Date   B12 deficiency    Endometrial cancer ([place])    GERD (gastroesophageal reflux disease)    History reviewed. No pertinent surgical history. 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 female who presents to outpatient physical therapy with a referral for medical diagnosis endometrial cancer, antineoplastic chemotherapy, deconditioning. This patient's chief complaints consist of deconditioning s/p cancer dx and treatment leading to the following functional deficits: difficulty with activities that require strength and endurance including walking, standing, playing with grandchildren, lifting, floor transfers. Relevant past medical  history and comorbidities include stroke (affected left arm temporarily), GERD, left hand weakness, dyslipidemia, B12 deficiency, endometrial cancer (stage IV), anemia.  Patient denies hx of seizures, lung problems, heart problems, diabetes, unexplained weight loss, unexplained changes in bowel or bladder problems, unexplained stumbling or dropping things, and spinal surgery, known osteoporosis.   PRECAUTIONS: from OT: don't grab small things.   SUBJECTIVE: Patient reports her iron has dropped and she is feeling really tired. She has started her iron supplementation. She was really sore the night after her PT eval. She felt better by next morning. She has no pain currently.   PAIN:  Are you having pain? denies   OBJECTIVE  SELF-REPORTED FUNCTION FOTO score: 62/100 (cancer questionnaire)  FUNCTIONAL/BALANCE TESTS 6 Minute Walk Test: 1462 feet no AD, SpO2 97%, HR 127 BPM.  Floor Transfer Test (average of 3 trials): 8.94 seconds without use of chair.  Trial 1: 9.25 seconds Trial 2: 8.91 seconds Trial 3: 8.68 seconds  Floor to waist box lift: up to 19.5# 1 rep. (L wrist and dyspnea limiting (plans to wear wrist brace next time).    TODAY'S TREATMENT  Therapeutic exercise: to centralize symptoms and improve ROM, strength, muscular endurance, and activity tolerance required for successful completion of functional activities.  - further testing to assess baselines (See above).  - sit <> stand from 17 inch chair,  2x10 - lateral walking with YTB around ankles, 2x21 feet.  - standing B heel raise with B UE support, 2x10 - Education on HEP including handout   Pt required multimodal cuing for proper technique and to facilitate improved neuromuscular control,  strength, range of motion, and functional ability resulting in improved performance and form.    PATIENT EDUCATION:  Exercise purpose/form. Self management techniques. Education on HEP including handout  Person educated:  Patient Education method: Explanation, demonstration, tactile and verbal cuing Education comprehension: verbalized and demonstrated understanding and needs further education     HOME EXERCISE PROGRAM: Access Code: [email or web address] URL: [email or web address] Date: 11/09/2021 Prepared by: Rosita Kea  Exercises - Sit to Stand Without Arm Support  - 3-4 x weekly - 2 sets - 10 reps - Side Stepping with Resistance at Ankles  - 3-4 x weekly - 2 sets - 20 feet - Heel Raises with Counter Support  - 3-4 x weekly - 2 sets - 10 reps   ASSESSMENT:   CLINICAL IMPRESSION: Patient arrives with increase fatigue today and report of soreness after initial eval. Patient continued baseline testing today and initial strengthening and HEP. Exercise volume kept low to start due to report of increased soreness at initial eval and fatigue today upon arrival and during session. Plan to continue with progressive strengthening as tolerated and appropriate next session. Patient's lifting ability was limited by left wrist pain and OT recommended patient use hard brace at future visits to prevent worsening of wrist pain. Patient would benefit from continued management of limiting condition by skilled physical therapist to address remaining impairments and functional limitations to work towards stated goals and return to PLOF or maximal functional independence.   From Initial PT eval 11/07/2021:  Patient is a 61 y.o. female referred to outpatient physical therapy with a medical diagnosis of endometrial cancer, antineoplastic chemotherapy, deconditioning who presents with signs and symptoms consistent with generalized deconditioning weakness following cancer diagnosis and treatment. Patient also appears to have R > L sciatic nerve tension and history of hamstring strain that is likely affecting her strength and stability R LE more than left. Patient has significant weakness of bilateral hip abductors that negatively  impact her ability to perform activities that require single leg stance such as floor transfers, walking, playing with her grandchildren, etc.  Patient presents with significant pain, muscle tension, muscle performance (strength/power/endurance), balance, and activity tolerance impairments that are limiting ability to complete her usual activities that require strength and endurance including walking, standing, lifting, playing with grandchildren, floor transfers without difficulty. Patient will benefit from skilled physical therapy intervention to address current body structure impairments and activity limitations to improve function and work towards goals set in current POC in order to return to prior level of function or maximal functional improvement.      OBJECTIVE IMPAIRMENTS cardiopulmonary status limiting activity, decreased activity tolerance, decreased balance, decreased endurance, decreased mobility, difficulty walking, decreased strength, impaired perceived functional ability, impaired flexibility, obesity, and pain.    ACTIVITY LIMITATIONS carrying, lifting, standing, squatting, and stairs   PARTICIPATION LIMITATIONS: interpersonal relationship, community activity, and   activities that require strength and endurance including walking, standing, playing with grandchildren, floor transfers, lifting.    PERSONAL FACTORS Past/current experiences, Time since onset of injury/illness/exacerbation, and 3+ comorbidities:    are also affecting patient's functional outcome.    REHAB POTENTIAL: Good   CLINICAL DECISION MAKING: Stable/uncomplicated   EVALUATION COMPLEXITY: Low     GOALS: Goals reviewed with patient? No   SHORT TERM GOALS: Target date: 11/21/2021   Patient will be independent with initial home exercise program for self-management of symptoms. Baseline: Initial HEP to be provided at visit 2 as appropriate (11/07/21); initial HEP provided at visit 2 (11/09/2021);  Goal status:  In-progress     LONG TERM GOALS: Target date: 01/30/2022   Patient will be independent with a long-term home exercise program for self-management of symptoms.  Baseline: Initial HEP to be provided at visit 2 as appropriate (11/07/21); initial HEP provided at visit 2 (11/09/2021);  Goal status: In-progress   2.  Patient will demonstrate improved FOTO by equal or greater than 10 points to demonstrate improvement in overall condition and self-reported functional ability.  Baseline: to be tested at visit 2 as appropriate (11/07/21); 62 at visit #2 (11/09/2021);  Goal status: In-progress   3.  Patient will demonstrate ability to perform single leg stance for equal or greater than 30 seconds with no trendelenburg on each side to improve her ability to complete activities that require single leg stance such as walking and floor transfers.  Baseline: R= 7 seconds, legs touching, trendelenburg present, L= 6 seconds, legs touching, trendelenburg present (11/07/21); Goal status: In-progress   4.  Patient will demonstrate ability to ambulate equal or greater than aged matched norm of 1765 feet during 6 Minute Walk Test to improve her community mobility.  Baseline: to be tested at visit 2 as appropriate (11/07/21); 1462 feet no AD, SpO2 97%, HR 127 BPM (11/09/2021);  Goal status: In-progress   5.  Patient will complete community, work and/or recreational activities without limitation due to current condition.  Baseline: difficulty with activities that require strength and endurance including walking, standing, playing with grandchildren, floor transfers, lifting (11/07/21); Goal status: In-progress   6.  Patient will complete Floor Transfer Test in equal or less than 8.8 seconds (cut off score for healthy elderly adult vs those with stroke) to demonstrate improved ability to get up and down from the floor to play with grandchildren.  Baseline: to be tested visit 2 as appropriate (11/07/2021); 8.94 seconds  without use of chair (11/09/2021);   Goal status: In-progress   7.  Patient will complete floor to waist lift with 25# box 10 repetitions in a row to demonstrate improved ability to perform lifting at home.   Baseline: to be tested visit 2 as appropriate (11/07/2021);  up to 19.5# 1 rep. (L wrist and dyspnea limiting (11/09/2021);   Goal status: In-progress   PLAN: PT FREQUENCY: 1-2x/week   PT DURATION: 12 weeks   PLANNED INTERVENTIONS: Therapeutic exercises, Therapeutic activity, Neuromuscular re-education, Balance training, Gait training, Patient/Family education, Self Care, Joint mobilization, Stair training, Cryotherapy, Moist heat, Manual therapy, and Re-evaluation.   PLAN FOR NEXT SESSION: update HEP as appropriate, functional, LE, and UE strengthening and balance exercises.    Nancy Nordmann, PT, DPT 11/09/2021, 5:16 PM  [facility] [address] [place],  [address] P: [phone] I F: [phone]

## 2021-11-14 ENCOUNTER — Ambulatory Visit: Payer: BC Managed Care – PPO | Attending: Physician Assistant

## 2021-11-14 DIAGNOSIS — R262 Difficulty in walking, not elsewhere classified: Secondary | ICD-10-CM | POA: Diagnosis present

## 2021-11-14 DIAGNOSIS — M6281 Muscle weakness (generalized): Secondary | ICD-10-CM | POA: Insufficient documentation

## 2021-11-14 NOTE — Therapy (Signed)
OUTPATIENT PHYSICAL THERAPY TREATMENT NOTE   [name] MRN: [id number] DOB:December 18, 1960, 61 y.o., female Today's Date: 11/14/2021  PCP: Rusty Aus, MD REFERRING PROVIDER: Tresa Garter, PA  END OF SESSION:   PT End of Session - 11/14/21 1556     Visit Number 3    Number of Visits 24    Date for PT Re-Evaluation 01/30/22    Authorization Type VL: Based on [place]    Progress Note Due on Visit 10    PT Start Time 1600    PT Stop Time 1640    PT Time Calculation (min) 40 min    Activity Tolerance Patient tolerated treatment well    Behavior During Therapy WFL for tasks assessed/performed             Past Medical History:  Diagnosis Date   B12 deficiency    Endometrial cancer ([place])    GERD (gastroesophageal reflux disease)    No past surgical history on file. 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 female who presents to outpatient physical therapy with a referral for medical diagnosis endometrial cancer, antineoplastic chemotherapy, deconditioning. This patient's chief complaints consist of deconditioning s/p cancer dx and treatment leading to the following functional deficits: difficulty with activities that require strength and endurance including walking, standing, playing with grandchildren, lifting, floor transfers. Relevant past medical history and  comorbidities include stroke (affected left arm temporarily), GERD, left hand weakness, dyslipidemia, B12 deficiency, endometrial cancer (stage IV), anemia.  Patient denies hx of seizures, lung problems, heart problems, diabetes, unexplained weight loss, unexplained changes in bowel or bladder problems, unexplained stumbling or dropping things, and spinal surgery, known osteoporosis.   PRECAUTIONS: from OT: don't grab small things.   SUBJECTIVE: Pt reports soreness improved since last visit. Been mostly compliant with HEP. No pain, just tired from spending time with grand children. Denies pain.   PAIN:  Are you having pain?No. Pt denies   OBJECTIVE  SELF-REPORTED FUNCTION FOTO score: 62/100 (cancer questionnaire)  FUNCTIONAL/BALANCE TESTS 6 Minute Walk Test: 1462 feet no AD, SpO2 97%, HR 127 BPM.  Floor Transfer Test (average of 3 trials): 8.94 seconds without use of chair.  Trial 1: 9.25 seconds Trial 2: 8.91 seconds Trial 3: 8.68 seconds  Floor to waist box lift: up to 19.5# 1 rep. (L wrist and dyspnea limiting (plans to wear wrist brace next time).    TODAY'S TREATMENT There.ex:   Nu-Step 5 min warm up with UE/LE use. L1 only.   REVIEW HEP as initially prescribed as below Access Code: [email or web address] URL: [email or web address] Date: 11/09/2021 Prepared by: Rosita Kea   Exercises - Sit to Stand Without Arm Support  - 3-4 x weekly - 2 sets - 10 reps - Side Stepping with Resistance at Ankles  - 3-4 x weekly - 2 sets - 20 feet - Heel Raises with Counter Support  - 3-4 x weekly - 2 sets - 10 reps     SLS with intermittent finger  tip support: 3x30 sec/LE. SBA. Noted Trendelenburg, Able to correct post min TC's and PT demo. Requires light finger support to maintain.     Alternating mini lunges at TM bar with SUE support: 1x8/LE. Min VC's for depth of squat.     Scap retractions with GTB: 3x15. Good form/technique     Bent over rows on elevated mat table to assist  in lifting items from floor: 5# DB. X8/UE. Min L wrist pain with brace donned     Bent over donkey kicks: x8/LE   Education on DOMS, normal soreness versus excessive soreness and when to progress exercise and when to regress/take rest days.      Initial multimodal cuing and PT demo with excellent carryover after cuing.     PATIENT EDUCATION:  Exercise purpose/form. Self management techniques. Education on HEP including handout  Person educated: Patient Education method: Explanation, demonstration, tactile and verbal cuing Education comprehension: verbalized and demonstrated understanding and needs further education     HOME EXERCISE PROGRAM: Access Code: [email or web address] URL: [email or web address] Date: 11/09/2021 Prepared by: Rosita Kea  Exercises - Sit to Stand Without Arm Support  - 3-4 x weekly - 2 sets - 10 reps - Side Stepping with Resistance at Ankles  - 3-4 x weekly - 2 sets - 20 feet - Heel Raises with Counter Support  - 3-4 x weekly - 2 sets - 10 reps   ASSESSMENT:   CLINICAL IMPRESSION: Pt arrives with good energy levels this date. Pt understanding of HEP with min VC's and TC's for form/technique and speed of exercise with excellent carryover. Integration of static single leg balance and functional UE strengthening to assist in long term goal accomplishment. Pt remains highly motivated tolerating all therex well without issue. Education provided on progression and regression of HEP pending DOMS. Pt will continue to benefit from skilled PT services to make progress towards impairments to return to PLOF.    From Initial PT eval 11/07/2021:  Patient is a 61 y.o. female referred to outpatient physical therapy with a medical diagnosis of endometrial cancer, antineoplastic chemotherapy, deconditioning who presents with signs and symptoms consistent with generalized deconditioning weakness following cancer diagnosis and treatment. Patient also appears to have R > L  sciatic nerve tension and history of hamstring strain that is likely affecting her strength and stability R LE more than left. Patient has significant weakness of bilateral hip abductors that negatively impact her ability to perform activities that require single leg stance such as floor transfers, walking, playing with her grandchildren, etc.  Patient presents with significant pain, muscle tension, muscle performance (strength/power/endurance), balance, and activity tolerance impairments that are limiting ability to complete her usual activities that require strength and endurance including walking, standing, lifting, playing with grandchildren, floor transfers without difficulty. Patient will benefit from skilled physical therapy intervention to address current body structure impairments and activity limitations to improve function and work towards goals set in current POC in order to return to prior level of function or maximal functional improvement.      OBJECTIVE IMPAIRMENTS cardiopulmonary status limiting activity, decreased activity tolerance, decreased balance, decreased endurance, decreased mobility, difficulty walking, decreased strength, impaired perceived functional ability, impaired flexibility, obesity, and pain.    ACTIVITY LIMITATIONS carrying, lifting, standing, squatting, and stairs   PARTICIPATION LIMITATIONS: interpersonal relationship, community activity, and   activities that require strength and endurance including walking, standing, playing with grandchildren, floor transfers, lifting.    PERSONAL FACTORS Past/current experiences, Time since onset of injury/illness/exacerbation, and  3+ comorbidities:    are also affecting patient's functional outcome.    REHAB POTENTIAL: Good   CLINICAL DECISION MAKING: Stable/uncomplicated   EVALUATION COMPLEXITY: Low     GOALS: Goals reviewed with patient? No   SHORT TERM GOALS: Target date: 11/21/2021   Patient will be independent  with initial home exercise program for self-management of symptoms. Baseline: Initial HEP to be provided at visit 2 as appropriate (11/07/21); initial HEP provided at visit 2 (11/09/2021);  Goal status: In-progress     LONG TERM GOALS: Target date: 01/30/2022   Patient will be independent with a long-term home exercise program for self-management of symptoms.  Baseline: Initial HEP to be provided at visit 2 as appropriate (11/07/21); initial HEP provided at visit 2 (11/09/2021);  Goal status: In-progress   2.  Patient will demonstrate improved FOTO by equal or greater than 10 points to demonstrate improvement in overall condition and self-reported functional ability.  Baseline: to be tested at visit 2 as appropriate (11/07/21); 62 at visit #2 (11/09/2021);  Goal status: In-progress   3.  Patient will demonstrate ability to perform single leg stance for equal or greater than 30 seconds with no trendelenburg on each side to improve her ability to complete activities that require single leg stance such as walking and floor transfers.  Baseline: R= 7 seconds, legs touching, trendelenburg present, L= 6 seconds, legs touching, trendelenburg present (11/07/21); Goal status: In-progress   4.  Patient will demonstrate ability to ambulate equal or greater than aged matched norm of 1765 feet during 6 Minute Walk Test to improve her community mobility.  Baseline: to be tested at visit 2 as appropriate (11/07/21); 1462 feet no AD, SpO2 97%, HR 127 BPM (11/09/2021);  Goal status: In-progress   5.  Patient will complete community, work and/or recreational activities without limitation due to current condition.  Baseline: difficulty with activities that require strength and endurance including walking, standing, playing with grandchildren, floor transfers, lifting (11/07/21); Goal status: In-progress   6.  Patient will complete Floor Transfer Test in equal or less than 8.8 seconds (cut off score for healthy  elderly adult vs those with stroke) to demonstrate improved ability to get up and down from the floor to play with grandchildren.  Baseline: to be tested visit 2 as appropriate (11/07/2021); 8.94 seconds without use of chair (11/09/2021);   Goal status: In-progress   7.  Patient will complete floor to waist lift with 25# box 10 repetitions in a row to demonstrate improved ability to perform lifting at home.   Baseline: to be tested visit 2 as appropriate (11/07/2021);  up to 19.5# 1 rep. (L wrist and dyspnea limiting (11/09/2021);   Goal status: In-progress   PLAN: PT FREQUENCY: 1-2x/week   PT DURATION: 12 weeks   PLANNED INTERVENTIONS: Therapeutic exercises, Therapeutic activity, Neuromuscular re-education, Balance training, Gait training, Patient/Family education, Self Care, Joint mobilization, Stair training, Cryotherapy, Moist heat, Manual therapy, and Re-evaluation.   PLAN FOR NEXT SESSION: update HEP as appropriate, functional, LE, and UE strengthening and balance exercises.    Salem Caster. Fairly IV, PT, DPT Physical Therapist- [facility]  11/14/2021, 4:44 PM

## 2021-11-15 ENCOUNTER — Encounter: Payer: BC Managed Care – PPO | Admitting: Physical Therapy

## 2021-11-17 ENCOUNTER — Ambulatory Visit: Payer: BC Managed Care – PPO

## 2021-11-17 DIAGNOSIS — M6281 Muscle weakness (generalized): Secondary | ICD-10-CM | POA: Diagnosis not present

## 2021-11-17 DIAGNOSIS — R262 Difficulty in walking, not elsewhere classified: Secondary | ICD-10-CM

## 2021-11-17 NOTE — Therapy (Signed)
OUTPATIENT PHYSICAL THERAPY TREATMENT NOTE   [name] MRN: [id number] DOB:01-Jan-1961, 61 y.o., female Today's Date: 11/17/2021  PCP: Rusty Aus, MD REFERRING PROVIDER: Tresa Garter, PA  END OF SESSION:   PT End of Session - 11/17/21 1603     Visit Number 4    Number of Visits 24    Date for PT Re-Evaluation 01/30/22    Authorization Type VL: Based on [place]    Progress Note Due on Visit 10    PT Start Time 1601    PT Stop Time 1640    PT Time Calculation (min) 39 min    Activity Tolerance Patient tolerated treatment well    Behavior During Therapy WFL for tasks assessed/performed             Past Medical History:  Diagnosis Date   B12 deficiency    Endometrial cancer ([place])    GERD (gastroesophageal reflux disease)    History reviewed. No pertinent surgical history. 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 female who presents to outpatient physical therapy with a referral for medical diagnosis endometrial cancer, antineoplastic chemotherapy, deconditioning. This patient's chief complaints consist of deconditioning s/p cancer dx and treatment leading to the following functional deficits: difficulty with activities that require strength and endurance including walking, standing, playing with grandchildren, lifting, floor transfers. Relevant past medical  history and comorbidities include stroke (affected left arm temporarily), GERD, left hand weakness, dyslipidemia, B12 deficiency, endometrial cancer (stage IV), anemia.  Patient denies hx of seizures, lung problems, heart problems, diabetes, unexplained weight loss, unexplained changes in bowel or bladder problems, unexplained stumbling or dropping things, and spinal surgery, known osteoporosis.   PRECAUTIONS: from OT: don't grab small things.   SUBJECTIVE: Pt reports compliance with HEP. Good energy levels this date.    PAIN:  Are you having pain?No. Pt denies   OBJECTIVE  SELF-REPORTED FUNCTION FOTO score: 62/100 (cancer questionnaire)  FUNCTIONAL/BALANCE TESTS 6 Minute Walk Test: 1462 feet no AD, SpO2 97%, HR 127 BPM.  Floor Transfer Test (average of 3 trials): 8.94 seconds without use of chair.  Trial 1: 9.25 seconds Trial 2: 8.91 seconds Trial 3: 8.68 seconds  Floor to waist box lift: up to 19.5# 1 rep. (L wrist and dyspnea limiting (plans to wear wrist brace next time).    TODAY'S TREATMENT There.ex:   Nu-Step 5 min warm up with UE/LE use. L3 only.    STS from chair with 3 KG med ball: 2x15.   SLS on BLE's: 3x30 sec with 2 finger support. Maintains neutral pelvic alignment.        Heel raises with heels over 2/4 board: 1x20.     Hip abduction in standing: 3x8/LE.        Bent over donkey kicks: 2x8/LE      Standing scap retractions with GTB: 2x30      PATIENT EDUCATION:  Exercise purpose/form. Self management techniques. Education on [employer] including handout  Person educated: Patient Education  method: Explanation, demonstration, tactile and verbal cuing Education comprehension: verbalized and demonstrated understanding and needs further education     HOME EXERCISE PROGRAM: Access Code: [email or web address] URL: [email or web address] Date: 11/09/2021 Prepared by: Rosita Kea  Exercises - Sit to Stand Without Arm Support  - 3-4 x weekly - 2 sets - 10  reps - Side Stepping with Resistance at Ankles  - 3-4 x weekly - 2 sets - 20 feet - Heel Raises with Counter Support  - 3-4 x weekly - 2 sets - 10 reps   ASSESSMENT:   CLINICAL IMPRESSION: Continuing PT POC with continuing to progress strength and endurance training for pt's deconditioning. Pt is highly motivated performing all exercises to failure. Pt demonstrating ability to come out of trendelenburg with very minimal cuing with glut med strengthening. Pt will continue to benefit from skilled PT services to make progress towards impairments to return to PLOF.    From Initial PT eval 11/07/2021:  Patient is a 61 y.o. female referred to outpatient physical therapy with a medical diagnosis of endometrial cancer, antineoplastic chemotherapy, deconditioning who presents with signs and symptoms consistent with generalized deconditioning weakness following cancer diagnosis and treatment. Patient also appears to have R > L sciatic nerve tension and history of hamstring strain that is likely affecting her strength and stability R LE more than left. Patient has significant weakness of bilateral hip abductors that negatively impact her ability to perform activities that require single leg stance such as floor transfers, walking, playing with her grandchildren, etc.  Patient presents with significant pain, muscle tension, muscle performance (strength/power/endurance), balance, and activity tolerance impairments that are limiting ability to complete her usual activities that require strength and endurance including walking, standing, lifting, playing with grandchildren, floor transfers without difficulty. Patient will benefit from skilled physical therapy intervention to address current body structure impairments and activity limitations to improve function and work towards goals set in current POC in order to return to prior level of function or maximal functional improvement.      OBJECTIVE IMPAIRMENTS  cardiopulmonary status limiting activity, decreased activity tolerance, decreased balance, decreased endurance, decreased mobility, difficulty walking, decreased strength, impaired perceived functional ability, impaired flexibility, obesity, and pain.    ACTIVITY LIMITATIONS carrying, lifting, standing, squatting, and stairs   PARTICIPATION LIMITATIONS: interpersonal relationship, community activity, and   activities that require strength and endurance including walking, standing, playing with grandchildren, floor transfers, lifting.    PERSONAL FACTORS Past/current experiences, Time since onset of injury/illness/exacerbation, and 3+ comorbidities:    are also affecting patient's functional outcome.    REHAB POTENTIAL: Good   CLINICAL DECISION MAKING: Stable/uncomplicated   EVALUATION COMPLEXITY: Low     GOALS: Goals reviewed with patient? No   SHORT TERM GOALS: Target date: 11/21/2021   Patient will be independent with initial home exercise program for self-management of symptoms. Baseline: Initial HEP to be provided at visit 2 as appropriate (11/07/21); initial HEP provided at visit 2 (11/09/2021);  Goal status: In-progress     LONG TERM GOALS: Target date: 01/30/2022   Patient will be independent with a long-term home exercise program for self-management of symptoms.  Baseline: Initial HEP to be provided at visit 2 as appropriate (11/07/21); initial HEP provided at visit 2 (11/09/2021);  Goal status: In-progress   2.  Patient will demonstrate improved FOTO by equal or greater than 10 points to demonstrate improvement in overall condition and self-reported functional ability.  Baseline: to be tested at visit 2 as appropriate (11/07/21); 62 at  visit #2 (11/09/2021);  Goal status: In-progress   3.  Patient will demonstrate ability to perform single leg stance for equal or greater than 30 seconds with no trendelenburg on each side to improve her ability to complete activities that  require single leg stance such as walking and floor transfers.  Baseline: R= 7 seconds, legs touching, trendelenburg present, L= 6 seconds, legs touching, trendelenburg present (11/07/21); Goal status: In-progress   4.  Patient will demonstrate ability to ambulate equal or greater than aged matched norm of 1765 feet during 6 Minute Walk Test to improve her community mobility.  Baseline: to be tested at visit 2 as appropriate (11/07/21); 1462 feet no AD, SpO2 97%, HR 127 BPM (11/09/2021);  Goal status: In-progress   5.  Patient will complete community, work and/or recreational activities without limitation due to current condition.  Baseline: difficulty with activities that require strength and endurance including walking, standing, playing with grandchildren, floor transfers, lifting (11/07/21); Goal status: In-progress   6.  Patient will complete Floor Transfer Test in equal or less than 8.8 seconds (cut off score for healthy elderly adult vs those with stroke) to demonstrate improved ability to get up and down from the floor to play with grandchildren.  Baseline: to be tested visit 2 as appropriate (11/07/2021); 8.94 seconds without use of chair (11/09/2021);   Goal status: In-progress   7.  Patient will complete floor to waist lift with 25# box 10 repetitions in a row to demonstrate improved ability to perform lifting at home.   Baseline: to be tested visit 2 as appropriate (11/07/2021);  up to 19.5# 1 rep. (L wrist and dyspnea limiting (11/09/2021);   Goal status: In-progress   PLAN: PT FREQUENCY: 1-2x/week   PT DURATION: 12 weeks   PLANNED INTERVENTIONS: Therapeutic exercises, Therapeutic activity, Neuromuscular re-education, Balance training, Gait training, Patient/Family education, Self Care, Joint mobilization, Stair training, Cryotherapy, Moist heat, Manual therapy, and Re-evaluation.   PLAN FOR NEXT SESSION: update HEP as appropriate, functional, LE, and UE strengthening and balance  exercises.    Salem Caster. Fairly IV, PT, DPT Physical Therapist- [facility]  11/17/2021, 5:15 PM

## 2021-11-22 ENCOUNTER — Encounter: Payer: Self-pay | Admitting: Physical Therapy

## 2021-11-22 ENCOUNTER — Ambulatory Visit: Payer: BC Managed Care – PPO | Admitting: Physical Therapy

## 2021-11-22 DIAGNOSIS — R262 Difficulty in walking, not elsewhere classified: Secondary | ICD-10-CM

## 2021-11-22 DIAGNOSIS — M6281 Muscle weakness (generalized): Secondary | ICD-10-CM | POA: Diagnosis not present

## 2021-11-22 NOTE — Therapy (Signed)
OUTPATIENT PHYSICAL THERAPY TREATMENT NOTE   [name] MRN: [id number] DOB:09-25-1960, 61 y.o., female Today's Date: 11/22/2021  PCP: Rusty Aus, MD REFERRING PROVIDER: Tresa Garter, PA  END OF SESSION:   PT End of Session - 11/22/21 1710     Visit Number 5    Number of Visits 24    Date for PT Re-Evaluation 01/30/22    Authorization Type VL: Based on [place]    Progress Note Due on Visit 10    PT Start Time 5462    PT Stop Time 1647    PT Time Calculation (min) 40 min    Activity Tolerance Patient tolerated treatment well    Behavior During Therapy WFL for tasks assessed/performed              Past Medical History:  Diagnosis Date   B12 deficiency    Endometrial cancer ([place])    GERD (gastroesophageal reflux disease)    History reviewed. No pertinent surgical history. 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 female who presents to outpatient physical therapy with a referral for medical diagnosis endometrial cancer, antineoplastic chemotherapy, deconditioning. This patient's chief complaints consist of deconditioning s/p cancer dx and treatment leading to the following functional deficits: difficulty with activities that require strength and endurance including walking, standing, playing with grandchildren, lifting, floor transfers. Relevant past medical  history and comorbidities include stroke (affected left arm temporarily), GERD, left hand weakness, dyslipidemia, B12 deficiency, endometrial cancer (stage IV), anemia.  Patient denies hx of seizures, lung problems, heart problems, diabetes, unexplained weight loss, unexplained changes in bowel or bladder problems, unexplained stumbling or dropping things, and spinal surgery, known osteoporosis.   PRECAUTIONS: from OT: don't grab small things.   SUBJECTIVE: Patient reports she was pretty sore and tired after last PT session. She states she was able to do the exercises last PT session but was very sore in B LE and tired for three days and felt this was too much. She states her iron is low and she is taking iron supplement. Left radial wrist has been a little sore.   PAIN:  Are you having pain? No. Pt denies   OBJECTIVE   TODAY'S TREATMENT Therapeutic exercise: to centralize symptoms and improve ROM, strength, muscular endurance, and activity tolerance required for successful completion of functional activities.  - NuStep level 3 using bilateral upper and lower extremities. Seat/handle setting 10/10. For improved extremity mobility, muscular endurance, and activity tolerance; and to induce the analgesic effect of aerobic exercise, stimulate improved joint nutrition, and prepare body structures and systems for following interventions. x 5  minutes. Average SPM = 86. - STS from chair with 3 KG med ball: 1x20  - SLS on BLE's: 3x30 sec with 2 finger support. Maintains neutral pelvic alignment with good carry over.    - Heel raises with heels over 2/4 board: 1x11, 1x5.   - Hip abduction in standing: 1x10, 1x5 each side.  - Bent over donkey  kicks: 1x8, 1x4 each side. (Pause due to cramping in left hamstring during first set, resolved with seated break and mild HS stretch).    - Standing scapular rows with 10# cable: 2x15  Pt required multimodal cuing for proper technique and to facilitate improved  neuromuscular control, strength, range of motion, and functional ability resulting in improved performance and form.    PATIENT EDUCATION:  Exercise purpose/form. Self management techniques. Education on HEP including handout  Person educated: Patient Education method: Explanation, demonstration, tactile and verbal cuing Education comprehension: verbalized and demonstrated understanding and needs further education     HOME EXERCISE PROGRAM: Access Code: [email or web address] URL: [email or web address] Date: 11/09/2021 Prepared by: Rosita Kea  Exercises - Sit to Stand Without Arm Support  - 3-4 x weekly - 2 sets - 10 reps - Side Stepping with Resistance at Ankles  - 3-4 x weekly - 2 sets - 20 feet - Heel Raises with Counter Support  - 3-4 x weekly - 2 sets - 10 reps   ASSESSMENT:   CLINICAL IMPRESSION: Patient continued with strengthening exercises, with volume slightly reduced from last session due to excessive soreness consistent with DOMS following last session. Patient tolerated well in session. Plan to continue with similar goals progressed as appropriate next session. Patient would benefit from continued management of limiting condition by skilled physical therapist to address remaining impairments and functional limitations to work towards stated goals and return to PLOF or maximal functional independence.   From Initial PT eval 11/07/2021:  Patient is a 61 y.o. female referred to outpatient physical therapy with a medical diagnosis of endometrial cancer, antineoplastic chemotherapy, deconditioning who presents with signs and symptoms consistent with generalized deconditioning weakness following cancer diagnosis and treatment. Patient also appears to have R > L sciatic nerve tension and history of hamstring strain that is likely affecting her strength and stability R LE more than left. Patient has significant weakness of bilateral hip abductors that negatively impact her ability to  perform activities that require single leg stance such as floor transfers, walking, playing with her grandchildren, etc.  Patient presents with significant pain, muscle tension, muscle performance (strength/power/endurance), balance, and activity tolerance impairments that are limiting ability to complete her usual activities that require strength and endurance including walking, standing, lifting, playing with grandchildren, floor transfers without difficulty. Patient will benefit from skilled physical therapy intervention to address current body structure impairments and activity limitations to improve function and work towards goals set in current POC in order to return to prior level of function or maximal functional improvement.      OBJECTIVE IMPAIRMENTS cardiopulmonary status limiting activity, decreased activity tolerance, decreased balance, decreased endurance, decreased mobility, difficulty walking, decreased strength, impaired perceived functional ability, impaired flexibility, obesity, and pain.    ACTIVITY LIMITATIONS carrying, lifting, standing, squatting, and stairs   PARTICIPATION LIMITATIONS: interpersonal relationship, community activity, and   activities that require strength and endurance including walking, standing, playing with grandchildren, floor transfers, lifting.    PERSONAL FACTORS Past/current experiences, Time since onset of injury/illness/exacerbation, and 3+ comorbidities:    are also affecting patient's functional outcome.    REHAB POTENTIAL: Good   CLINICAL DECISION MAKING: Stable/uncomplicated   EVALUATION COMPLEXITY: Low     GOALS: Goals reviewed with patient? No   SHORT TERM GOALS: Target date: 11/21/2021   Patient will be independent with initial home exercise program for self-management of symptoms. Baseline: Initial HEP to be provided at visit 2 as appropriate (11/07/21); initial HEP provided at visit  2 (11/09/2021);  Goal status: In-progress     LONG  TERM GOALS: Target date: 01/30/2022   Patient will be independent with a long-term home exercise program for self-management of symptoms.  Baseline: Initial HEP to be provided at visit 2 as appropriate (11/07/21); initial HEP provided at visit 2 (11/09/2021);  Goal status: In-progress   2.  Patient will demonstrate improved FOTO by equal or greater than 10 points to demonstrate improvement in overall condition and self-reported functional ability.  Baseline: to be tested at visit 2 as appropriate (11/07/21); 62 at visit #2 (11/09/2021);  Goal status: In-progress   3.  Patient will demonstrate ability to perform single leg stance for equal or greater than 30 seconds with no trendelenburg on each side to improve her ability to complete activities that require single leg stance such as walking and floor transfers.  Baseline: R= 7 seconds, legs touching, trendelenburg present, L= 6 seconds, legs touching, trendelenburg present (11/07/21); Goal status: In-progress   4.  Patient will demonstrate ability to ambulate equal or greater than aged matched norm of 1765 feet during 6 Minute Walk Test to improve her community mobility.  Baseline: to be tested at visit 2 as appropriate (11/07/21); 1462 feet no AD, SpO2 97%, HR 127 BPM (11/09/2021);  Goal status: In-progress   5.  Patient will complete community, work and/or recreational activities without limitation due to current condition.  Baseline: difficulty with activities that require strength and endurance including walking, standing, playing with grandchildren, floor transfers, lifting (11/07/21); Goal status: In-progress   6.  Patient will complete Floor Transfer Test in equal or less than 8.8 seconds (cut off score for healthy elderly adult vs those with stroke) to demonstrate improved ability to get up and down from the floor to play with grandchildren.  Baseline: to be tested visit 2 as appropriate (11/07/2021); 8.94 seconds without use of chair  (11/09/2021);   Goal status: In-progress   7.  Patient will complete floor to waist lift with 25# box 10 repetitions in a row to demonstrate improved ability to perform lifting at home.   Baseline: to be tested visit 2 as appropriate (11/07/2021);  up to 19.5# 1 rep. (L wrist and dyspnea limiting (11/09/2021);   Goal status: In-progress   PLAN: PT FREQUENCY: 1-2x/week   PT DURATION: 12 weeks   PLANNED INTERVENTIONS: Therapeutic exercises, Therapeutic activity, Neuromuscular re-education, Balance training, Gait training, Patient/Family education, Self Care, Joint mobilization, Stair training, Cryotherapy, Moist heat, Manual therapy, and Re-evaluation.   PLAN FOR NEXT SESSION: update HEP as appropriate, functional, LE, and UE strengthening and balance exercises.    Everlean Alstrom. Graylon Good, PT, DPT 11/22/21, 5:11 PM  [facility] [address] [place], [place] [address] P: [phone] I F: [phone]

## 2021-11-24 ENCOUNTER — Encounter: Payer: Self-pay | Admitting: Physical Therapy

## 2021-11-24 ENCOUNTER — Ambulatory Visit: Payer: BC Managed Care – PPO | Admitting: Physical Therapy

## 2021-11-24 DIAGNOSIS — R262 Difficulty in walking, not elsewhere classified: Secondary | ICD-10-CM

## 2021-11-24 DIAGNOSIS — M6281 Muscle weakness (generalized): Secondary | ICD-10-CM | POA: Diagnosis not present

## 2021-11-24 NOTE — Therapy (Signed)
OUTPATIENT PHYSICAL THERAPY TREATMENT NOTE   [name] MRN: [id number] DOB:[birth year], 61 y.o., female Today's Date: 11/24/2021  PCP: Rusty Aus, MD REFERRING PROVIDER: Tresa Garter, PA  END OF SESSION:   PT End of Session - 11/24/21 1609     Visit Number 6    Number of Visits 24    Date for PT Re-Evaluation 01/30/22    Authorization Type VL: Based on [place]    Progress Note Due on Visit 10    PT Start Time 1605    PT Stop Time 1643    PT Time Calculation (min) 38 min    Activity Tolerance Patient tolerated treatment well    Behavior During Therapy WFL for tasks assessed/performed               Past Medical History:  Diagnosis Date   B12 deficiency    Endometrial cancer ([place])    GERD (gastroesophageal reflux disease)    History reviewed. No pertinent surgical history. 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 female who presents to outpatient physical therapy with a referral for medical diagnosis endometrial cancer, antineoplastic chemotherapy, deconditioning. This patient's chief complaints consist of deconditioning s/p cancer dx and treatment leading to the following functional deficits: difficulty with activities that require strength and endurance including walking, standing, playing with grandchildren, lifting, floor transfers. Relevant past  medical history and comorbidities include stroke (affected left arm temporarily), GERD, left hand weakness, dyslipidemia, B12 deficiency, endometrial cancer (stage IV), anemia.  Patient denies hx of seizures, lung problems, heart problems, diabetes, unexplained weight loss, unexplained changes in bowel or bladder problems, unexplained stumbling or dropping things, and spinal surgery, known osteoporosis.   PRECAUTIONS: from OT: don't grab small things.   SUBJECTIVE: Patient reports her fatigue is usual upon arrival. Reports no soreness after last PT session and is not in pain today.   PAIN:  Are you having pain? No  OBJECTIVE   TODAY'S TREATMENT Therapeutic exercise: to centralize symptoms and improve ROM, strength, muscular endurance, and activity tolerance required for successful completion of functional activities.  - NuStep level 4 using bilateral upper and lower extremities. Seat/handle setting 10/10. For improved extremity mobility, muscular endurance, and activity tolerance; and to induce the analgesic effect of aerobic exercise, stimulate improved joint nutrition, and prepare body structures and systems for following interventions. x 5  minutes. Average SPM = 98. - STS from chair with 3 KG med ball: 2x10. - SLS on BLE's: 3x30 sec with 1-2 finger support. Maintains neutral pelvic alignment with good carry over.    - Heel raises with heels over 2/4 board: 2x10.   - Hip abduction in standing: 2x10, each side.  - Bent over donkey kicks: 2x8,each side.    - Standing scapular rows with 10# cable: 2x15 - lateral walking in mini-squat 1x30 feet each direction - reverse mini-lunge with U UE support, 1x8 each side.   Pt required multimodal cuing for proper technique and to facilitate improved  neuromuscular control, strength, range of motion, and functional ability resulting in improved performance and form.    PATIENT EDUCATION:  Exercise purpose/form. Self management techniques. Education  on HEP including handout  Person educated: Patient Education method: Explanation, demonstration, tactile and verbal cuing Education comprehension: verbalized and demonstrated understanding and needs further education     HOME EXERCISE PROGRAM: Access Code: [email or web address] URL: [email or web address] Date: 11/09/2021 Prepared by: Rosita Kea  Exercises - Sit to Stand Without Arm Support  - 3-4 x weekly - 2 sets - 10 reps - Side Stepping with Resistance at Ankles  - 3-4 x weekly - 2 sets - 20 feet - Heel Raises with Counter Support  - 3-4 x weekly - 2 sets - 10 reps   ASSESSMENT:   CLINICAL IMPRESSION: Patient arrived with report of good tolerance to last PT session, so exercise volume was gently increased this session. Patient continues to be highly motivated but reports fatigue that she pushes through to continue. Provided rests as desired/needed. Patient will good carry over for form. Patient would benefit from continued management of limiting condition by skilled physical therapist to address remaining impairments and functional limitations to work towards stated goals and return to PLOF or maximal functional independence.   From Initial PT eval 11/07/2021:  Patient is a 61 y.o. female referred to outpatient physical therapy with a medical diagnosis of endometrial cancer, antineoplastic chemotherapy, deconditioning who presents with signs and symptoms consistent with generalized deconditioning weakness following cancer diagnosis and treatment. Patient also appears to have R > L sciatic nerve tension and history of hamstring strain that is likely affecting her strength and stability R LE more than left. Patient has significant weakness of bilateral hip abductors that negatively impact her ability to perform activities that require single leg stance such as floor transfers, walking, playing with her grandchildren, etc.  Patient presents with significant pain, muscle tension, muscle  performance (strength/power/endurance), balance, and activity tolerance impairments that are limiting ability to complete her usual activities that require strength and endurance including walking, standing, lifting, playing with grandchildren, floor transfers without difficulty. Patient will benefit from skilled physical therapy intervention to address current body structure impairments and activity limitations to improve function and work towards goals set in current POC in order to return to prior level of function or maximal functional improvement.      OBJECTIVE IMPAIRMENTS cardiopulmonary status limiting activity, decreased activity tolerance, decreased balance, decreased endurance, decreased mobility, difficulty walking, decreased strength, impaired perceived functional ability, impaired flexibility, obesity, and pain.    ACTIVITY LIMITATIONS carrying, lifting, standing, squatting, and stairs   PARTICIPATION LIMITATIONS: interpersonal relationship, community activity, and   activities that require strength and endurance including walking, standing, playing with grandchildren, floor transfers, lifting.    PERSONAL FACTORS Past/current experiences, Time since onset of injury/illness/exacerbation, and 3+ comorbidities:    are also affecting patient's functional outcome.    REHAB POTENTIAL: Good   CLINICAL DECISION MAKING: Stable/uncomplicated   EVALUATION COMPLEXITY: Low     GOALS: Goals reviewed with patient? No   SHORT TERM GOALS: Target date: 11/21/2021   Patient will be independent with initial home exercise program for self-management of symptoms. Baseline: Initial HEP to be provided at visit 2 as appropriate (11/07/21); initial HEP provided at visit 2 (11/09/2021);  Goal status: In-progress     LONG TERM GOALS: Target date: 01/30/2022   Patient will be independent with a long-term home exercise program for self-management of symptoms.  Baseline: Initial HEP to be provided  at  visit 2 as appropriate (11/07/21); initial HEP provided at visit 2 (11/09/2021);  Goal status: In-progress   2.  Patient will demonstrate improved FOTO by equal or greater than 10 points to demonstrate improvement in overall condition and self-reported functional ability.  Baseline: to be tested at visit 2 as appropriate (11/07/21); 62 at visit #2 (11/09/2021);  Goal status: In-progress   3.  Patient will demonstrate ability to perform single leg stance for equal or greater than 30 seconds with no trendelenburg on each side to improve her ability to complete activities that require single leg stance such as walking and floor transfers.  Baseline: R= 7 seconds, legs touching, trendelenburg present, L= 6 seconds, legs touching, trendelenburg present (11/07/21); Goal status: In-progress   4.  Patient will demonstrate ability to ambulate equal or greater than aged matched norm of 1765 feet during 6 Minute Walk Test to improve her community mobility.  Baseline: to be tested at visit 2 as appropriate (11/07/21); 1462 feet no AD, SpO2 97%, HR 127 BPM (11/09/2021);  Goal status: In-progress   5.  Patient will complete community, work and/or recreational activities without limitation due to current condition.  Baseline: difficulty with activities that require strength and endurance including walking, standing, playing with grandchildren, floor transfers, lifting (11/07/21); Goal status: In-progress   6.  Patient will complete Floor Transfer Test in equal or less than 8.8 seconds (cut off score for healthy elderly adult vs those with stroke) to demonstrate improved ability to get up and down from the floor to play with grandchildren.  Baseline: to be tested visit 2 as appropriate (11/07/2021); 8.94 seconds without use of chair (11/09/2021);   Goal status: In-progress   7.  Patient will complete floor to waist lift with 25# box 10 repetitions in a row to demonstrate improved ability to perform lifting at  home.   Baseline: to be tested visit 2 as appropriate (11/07/2021);  up to 19.5# 1 rep. (L wrist and dyspnea limiting (11/09/2021);   Goal status: In-progress   PLAN: PT FREQUENCY: 1-2x/week   PT DURATION: 12 weeks   PLANNED INTERVENTIONS: Therapeutic exercises, Therapeutic activity, Neuromuscular re-education, Balance training, Gait training, Patient/Family education, Self Care, Joint mobilization, Stair training, Cryotherapy, Moist heat, Manual therapy, and Re-evaluation.   PLAN FOR NEXT SESSION: update HEP as appropriate, functional, LE, and UE strengthening and balance exercises.    Everlean Alstrom. Graylon Good, PT, DPT 11/24/21, 4:43 PM  [facility] [address] [place], [place] [address] P: [phone] I F: [phone]

## 2021-11-29 ENCOUNTER — Encounter: Payer: Self-pay | Admitting: Physical Therapy

## 2021-11-29 ENCOUNTER — Ambulatory Visit: Payer: BC Managed Care – PPO | Admitting: Physical Therapy

## 2021-11-29 DIAGNOSIS — M6281 Muscle weakness (generalized): Secondary | ICD-10-CM

## 2021-11-29 DIAGNOSIS — R262 Difficulty in walking, not elsewhere classified: Secondary | ICD-10-CM

## 2021-11-29 NOTE — Therapy (Signed)
OUTPATIENT PHYSICAL THERAPY TREATMENT NOTE   [name] MRN: [id number] DOB:[birth year], 61 y.o., female Today's Date: 11/29/2021  PCP: Rusty Aus, MD REFERRING PROVIDER: Tresa Garter, PA  END OF SESSION:   PT End of Session - 11/29/21 1640     Visit Number 7    Number of Visits 24    Date for PT Re-Evaluation 01/30/22    Authorization Type VL: Based on [place]    Progress Note Due on Visit 10    PT Start Time 1602    PT Stop Time 1640    PT Time Calculation (min) 38 min    Activity Tolerance Patient tolerated treatment well    Behavior During Therapy WFL for tasks assessed/performed                Past Medical History:  Diagnosis Date   B12 deficiency    Endometrial cancer ([place])    GERD (gastroesophageal reflux disease)    History reviewed. No pertinent surgical history. 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 female who presents to outpatient physical therapy with a referral for medical diagnosis endometrial cancer, antineoplastic chemotherapy, deconditioning. This patient's chief complaints consist of deconditioning s/p cancer dx and treatment leading to the following functional deficits: difficulty with activities that require strength and endurance including walking, standing, playing with grandchildren, lifting, floor transfers. Relevant past  medical history and comorbidities include stroke (affected left arm temporarily), GERD, left hand weakness, dyslipidemia, B12 deficiency, endometrial cancer (stage IV), anemia.  Patient denies hx of seizures, lung problems, heart problems, diabetes, unexplained weight loss, unexplained changes in bowel or bladder problems, unexplained stumbling or dropping things, and spinal surgery, known osteoporosis.   PRECAUTIONS: from OT: don't grab small things.   SUBJECTIVE: Patient reports no pain upon arrival and states she was only mildly sore around the midline after last PT session.   PAIN:  Are you having pain? No  OBJECTIVE   TODAY'S TREATMENT Therapeutic exercise: to centralize symptoms and improve ROM, strength, muscular endurance, and activity tolerance required for successful completion of functional activities.  - NuStep level 5 using bilateral upper and lower extremities. Seat/handle setting 10/10. For improved extremity mobility, muscular endurance, and activity tolerance; and to induce the analgesic effect of aerobic exercise, stimulate improved joint nutrition, and prepare body structures and systems for following interventions. x 5  minutes. Average SPM = 98. - STS from chair 17 inch chair with 3 KG med ball: 2x10, 1x5.  - SLS on BLE's: 3x30 sec with 1-2 finger support. Maintains neutral pelvic alignment with good carry over.   Circuit:   - Heel raises with heels over 2/4 board: 3x10. - Hip abduction in standing: 2x10, each side,  narrow B UE support.    - Bent over donkey kicks: 2x10, each side.    - Standing scapular rows with 10# cable: 2x15. - lateral walking in mini-squat 1x30 feet each direction - reverse mini-lunge with U UE support, 2x8 each side.  Pt required multimodal cuing for proper technique and to facilitate improved neuromuscular control, strength, range of motion, and functional ability resulting in improved performance and form.    PATIENT EDUCATION:  Exercise  purpose/form. Self management techniques. Education on HEP including handout  Person educated: Patient Education method: Explanation, demonstration, tactile and verbal cuing Education comprehension: verbalized and demonstrated understanding and needs further education     HOME EXERCISE PROGRAM: Access Code: [email or web address] URL: [email or web address] Date: 11/09/2021 Prepared by: Rosita Kea  Exercises - Sit to Stand Without Arm Support  - 3-4 x weekly - 2 sets - 10 reps - Side Stepping with Resistance at Ankles  - 3-4 x weekly - 2 sets - 20 feet - Heel Raises with Counter Support  - 3-4 x weekly - 2 sets - 10 reps   ASSESSMENT:   CLINICAL IMPRESSION: Patient continues with report of good tolerance to PT after last session. Gradually increased exercise volume this session accordingly.  Patient sufficiently challenged and quite fatigued by end of session. Patient continues to need skilled intervention to improve functional strength and activity tolerance without provoking increased pain/exhaustion for improved quality of life. Patient would benefit from continued management of limiting condition by skilled physical therapist to address remaining impairments and functional limitations to work towards stated goals and return to PLOF or maximal functional independence.   From Initial PT eval 11/07/2021:  Patient is a 61 y.o. female referred to outpatient physical therapy with a medical diagnosis of endometrial cancer, antineoplastic chemotherapy, deconditioning who presents with signs and symptoms consistent with generalized deconditioning weakness following cancer diagnosis and treatment. Patient also appears to have R > L sciatic nerve tension and history of hamstring strain that is likely affecting her strength and stability R LE more than left. Patient has significant weakness of bilateral hip abductors that negatively impact her ability to perform activities that require single leg  stance such as floor transfers, walking, playing with her grandchildren, etc.  Patient presents with significant pain, muscle tension, muscle performance (strength/power/endurance), balance, and activity tolerance impairments that are limiting ability to complete her usual activities that require strength and endurance including walking, standing, lifting, playing with grandchildren, floor transfers without difficulty. Patient will benefit from skilled physical therapy intervention to address current body structure impairments and activity limitations to improve function and work towards goals set in current POC in order to return to prior level of function or maximal functional improvement.      OBJECTIVE IMPAIRMENTS cardiopulmonary status limiting activity, decreased activity tolerance, decreased balance, decreased endurance, decreased mobility, difficulty walking, decreased strength, impaired perceived functional ability, impaired flexibility, obesity, and pain.    ACTIVITY LIMITATIONS carrying, lifting, standing, squatting, and stairs   PARTICIPATION LIMITATIONS: interpersonal relationship, community activity, and   activities that require strength and endurance including walking, standing, playing with grandchildren, floor transfers, lifting.    PERSONAL FACTORS Past/current experiences, Time since onset of injury/illness/exacerbation, and 3+ comorbidities:    are also affecting patient's functional outcome.    REHAB POTENTIAL: Good   CLINICAL DECISION MAKING: Stable/uncomplicated   EVALUATION COMPLEXITY: Low     GOALS: Goals reviewed with patient? No   SHORT TERM GOALS: Target date: 11/21/2021   Patient will be independent with initial home exercise program for self-management of symptoms. Baseline: Initial HEP to be provided at visit 2 as appropriate (11/07/21); initial HEP provided at visit 2 (11/09/2021);  Goal status: In-progress     LONG TERM GOALS: Target date: 01/30/2022    Patient will  be independent with a long-term home exercise program for self-management of symptoms.  Baseline: Initial HEP to be provided at visit 2 as appropriate (11/07/21); initial HEP provided at visit 2 (11/09/2021);  Goal status: In-progress   2.  Patient will demonstrate improved FOTO by equal or greater than 10 points to demonstrate improvement in overall condition and self-reported functional ability.  Baseline: to be tested at visit 2 as appropriate (11/07/21); 62 at visit #2 (11/09/2021);  Goal status: In-progress   3.  Patient will demonstrate ability to perform single leg stance for equal or greater than 30 seconds with no trendelenburg on each side to improve her ability to complete activities that require single leg stance such as walking and floor transfers.  Baseline: R= 7 seconds, legs touching, trendelenburg present, L= 6 seconds, legs touching, trendelenburg present (11/07/21); Goal status: In-progress   4.  Patient will demonstrate ability to ambulate equal or greater than aged matched norm of 1765 feet during 6 Minute Walk Test to improve her community mobility.  Baseline: to be tested at visit 2 as appropriate (11/07/21); 1462 feet no AD, SpO2 97%, HR 127 BPM (11/09/2021);  Goal status: In-progress   5.  Patient will complete community, work and/or recreational activities without limitation due to current condition.  Baseline: difficulty with activities that require strength and endurance including walking, standing, playing with grandchildren, floor transfers, lifting (11/07/21); Goal status: In-progress   6.  Patient will complete Floor Transfer Test in equal or less than 8.8 seconds (cut off score for healthy elderly adult vs those with stroke) to demonstrate improved ability to get up and down from the floor to play with grandchildren.  Baseline: to be tested visit 2 as appropriate (11/07/2021); 8.94 seconds without use of chair (11/09/2021);   Goal status: In-progress    7.  Patient will complete floor to waist lift with 25# box 10 repetitions in a row to demonstrate improved ability to perform lifting at home.   Baseline: to be tested visit 2 as appropriate (11/07/2021);  up to 19.5# 1 rep. (L wrist and dyspnea limiting (11/09/2021);   Goal status: In-progress   PLAN: PT FREQUENCY: 1-2x/week   PT DURATION: 12 weeks   PLANNED INTERVENTIONS: Therapeutic exercises, Therapeutic activity, Neuromuscular re-education, Balance training, Gait training, Patient/Family education, Self Care, Joint mobilization, Stair training, Cryotherapy, Moist heat, Manual therapy, and Re-evaluation.   PLAN FOR NEXT SESSION: update HEP as appropriate, functional, LE, and UE strengthening and balance exercises.    Everlean Alstrom. Graylon Good, PT, DPT 11/29/21, 4:41 PM  [facility] [address] [place], [place] [address] P: [phone] I F: [phone]

## 2021-12-01 ENCOUNTER — Ambulatory Visit: Payer: BC Managed Care – PPO | Admitting: Physical Therapy

## 2021-12-06 ENCOUNTER — Encounter: Payer: BC Managed Care – PPO | Admitting: Physical Therapy

## 2021-12-08 ENCOUNTER — Ambulatory Visit: Payer: BC Managed Care – PPO | Admitting: Physical Therapy

## 2021-12-12 ENCOUNTER — Ambulatory Visit: Payer: BC Managed Care – PPO | Admitting: Physical Therapy

## 2021-12-12 ENCOUNTER — Encounter: Payer: Self-pay | Admitting: Physical Therapy

## 2021-12-12 DIAGNOSIS — R262 Difficulty in walking, not elsewhere classified: Secondary | ICD-10-CM

## 2021-12-12 DIAGNOSIS — M6281 Muscle weakness (generalized): Secondary | ICD-10-CM | POA: Diagnosis not present

## 2021-12-12 NOTE — Therapy (Signed)
OUTPATIENT PHYSICAL THERAPY TREATMENT NOTE   [name] MRN: [id number] DOB:Oct 29, 1960, 61 y.o., female Today's Date: 12/12/2021  PCP: Rusty Aus, MD REFERRING PROVIDER: Tresa Garter, PA  END OF SESSION:   PT End of Session - 12/12/21 1613     Visit Number 8    Number of Visits 24    Date for PT Re-Evaluation 01/30/22    Authorization Type VL: Based on [place]    Progress Note Due on Visit 10    PT Start Time 1600    PT Stop Time 1640    PT Time Calculation (min) 40 min    Activity Tolerance Patient tolerated treatment well    Behavior During Therapy WFL for tasks assessed/performed                 Past Medical History:  Diagnosis Date   B12 deficiency    Endometrial cancer ([place])    GERD (gastroesophageal reflux disease)    History reviewed. No pertinent surgical history. 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 female who presents to outpatient physical therapy with a referral for medical diagnosis endometrial cancer, antineoplastic chemotherapy, deconditioning. This patient's chief complaints consist of deconditioning s/p cancer dx and treatment leading to the following functional deficits: difficulty with activities that require strength and endurance including walking, standing, playing with grandchildren, lifting, floor transfers. Relevant past  medical history and comorbidities include stroke (affected left arm temporarily), GERD, left hand weakness, dyslipidemia, B12 deficiency, endometrial cancer (stage IV), anemia.  Patient denies hx of seizures, lung problems, heart problems, diabetes, unexplained weight loss, unexplained changes in bowel or bladder problems, unexplained stumbling or dropping things, and spinal surgery, known osteoporosis.   PRECAUTIONS: from OT: don't grab small things.   SUBJECTIVE: Patent reports she feels like she is feeling apart. She missed PT last week because her posterior left knee started hurting two days after last PT session. It felt like her knee had been hyperextended. She rested it and mid last week it started to feel better. She then started having pain in her left heel that started last week at some point. She has a history of this  when she was in her 86s. She states she got new work shoes this summer and she started wearing them when it started getting cold. She got some new good tennis shoes and inserts but it is not feeling better. She states the pain is around the edges of the heel. She states she then started having difficulty with her left hip when she was sleeping (on her side) and when she walks. It is now only hurting at night. It feels like pressure. She is planning to put a pillow between her legs. The left is worse than the right, but both hurt. Foot pain is worst in the morning. She is not having back pain. She denies any unusual numbness or tingling that she notices more on the left. She saw her cardiologist (said she was doing  well), her neurologist (discharged her), and oncologist (wanted to do scans every 6 months or symptoms and do less radiation).   PAIN:  Are you having pain? Not currently but at night has left heel around edges of heel, left lateral hip over greater trochanter.   OBJECTIVE  Lumbar AROM: Flexion: = WNL. Extension: = WNL. Rotation: R = WNL, L = WNL. Side Flexion: R =  WNL, L = WNL, except slight twinge of pain at top of left lateral pelvis with OP.  STRENGTH:  * Denotes pain Hip  Abduction: R = 4+/5, L = 4+*/5. Ankle (seated position) Dorsiflexion: L = 5/5. Plantarflexion:  L = 5/5. Eversion: L = 5/5. Great toe extension:  L = 5/5. Pronation: L = 4+/5,  Inversion: L = 5/5,  Palpation - concordant TTP at bilateral greater trochanters (L> R) and less so at bilateral glute med and right posterior hip region (diffusely TTP).  - concordant exquisitely TTP at left posterior aspect of medial calcaneus (not the plantar region) with more diffuse TTP as moving away from region.   ANKLE/FOOT SPECIAL TESTS L ankle Slightly painful: lateral tilt test Negative: medial tilt test, anterior/posterior drawer, shuck test (medial/lateral), plantar fascia tension with palpation.   LOWER LIMB NEURODYNAMIC TESTS Straight Leg Raise (Sciatic nerve)  R  = negative for knee, hip, or back pain  L  = negative for knee, hip, or back pain  HIP SPECIAL TESTS FADDIR R = negative, L = pinching at groin, not concordant. FABER: R = negative, L = negative. ER Derotation test: R = positive, L = positive (stronger pain than right)   Accessory motion - L subtalar mobilization did not change pain.   TODAY'S TREATMENT Therapeutic exercise: to centralize symptoms and improve ROM, strength, muscular endurance, and activity tolerance required for successful completion of functional activities.  - lumbar AROM screen, flexion/extension on change in LE symptoms.  - NuStep level 5 using bilateral upper and lower extremities. Seat/handle setting 10/10. For improved extremity mobility, muscular endurance, and activity tolerance; and to induce the analgesic effect of aerobic exercise, stimulate improved joint nutrition, and prepare body structures and systems for following interventions. x 5  minutes. Average SPM = 78. - examination of hip pain and foot pain (see above).  - Education on  diagnosis, prognosis, POC, anatomy and physiology of current condition and HEP.   Manual therapy: to reduce pain and tissue tension, improve range of motion, neuromodulation, in order to promote improved ability to complete functional activities. PRONE - STM to left triceps surae, plantar fascia, and soft tissue at left heel to decrease pain and tension.    Pt required multimodal cuing for proper technique and to facilitate improved neuromuscular control, strength, range of motion, and functional ability resulting in improved performance and form.    PATIENT EDUCATION:  Exercise purpose/form. Self management techniques. Education on HEP including handout  Person educated: Patient Education method: Explanation, demonstration, tactile and verbal cuing Education comprehension: verbalized and demonstrated understanding and needs further education     HOME EXERCISE PROGRAM: Access Code: [email or web address] URL: [email or web address] Date: 11/09/2021 Prepared by: Rosita Kea  Exercises - Sit to Stand Without Arm Support  - 3-4 x weekly - 2 sets - 10 reps - Side Stepping with Resistance at Ankles  - 3-4 x weekly - 2 sets - 20 feet - Heel Raises with Counter Support  - 3-4 x weekly - 2 sets - 10 reps   ASSESSMENT:   CLINICAL IMPRESSION: Patient arrives with  concerns about previous L posterior knee pain,  B hip pain (L>R) and left foot pain. Session focused on further examination of these complaints and interventions for pain relief. Patient's left posterior knee pain was not reproduced during exam. Low back referral did not appear to be leading to the symptoms, as AROM for low back and neurodynamic testing were negative. Patient's hip symptoms consistent with greater trochanteric pain syndrome based of of description and objective findings of positive ER derotation test, subjective report, and palpation. Left foot pain less clear, but may be acute plantar's fasciitis. Plan to continue with  strengthening exercises with modifications and/or manual therapy as needed to address these new areas of pain. Patient educated today on modifications to HEP and pain reduction techniques for self-management until next session. Patient would benefit from continued management of limiting condition by skilled physical therapist to address remaining impairments and functional limitations to work towards stated goals and return to PLOF or maximal functional independence.   From Initial PT eval 11/07/2021:  Patient is a 61 y.o. female referred to outpatient physical therapy with a medical diagnosis of endometrial cancer, antineoplastic chemotherapy, deconditioning who presents with signs and symptoms consistent with generalized deconditioning weakness following cancer diagnosis and treatment. Patient also appears to have R > L sciatic nerve tension and history of hamstring strain that is likely affecting her strength and stability R LE more than left. Patient has significant weakness of bilateral hip abductors that negatively impact her ability to perform activities that require single leg stance such as floor transfers, walking, playing with her grandchildren, etc.  Patient presents with significant pain, muscle tension, muscle performance (strength/power/endurance), balance, and activity tolerance impairments that are limiting ability to complete her usual activities that require strength and endurance including walking, standing, lifting, playing with grandchildren, floor transfers without difficulty. Patient will benefit from skilled physical therapy intervention to address current body structure impairments and activity limitations to improve function and work towards goals set in current POC in order to return to prior level of function or maximal functional improvement.      OBJECTIVE IMPAIRMENTS cardiopulmonary status limiting activity, decreased activity tolerance, decreased balance, decreased endurance,  decreased mobility, difficulty walking, decreased strength, impaired perceived functional ability, impaired flexibility, obesity, and pain.    ACTIVITY LIMITATIONS carrying, lifting, standing, squatting, and stairs   PARTICIPATION LIMITATIONS: interpersonal relationship, community activity, and   activities that require strength and endurance including walking, standing, playing with grandchildren, floor transfers, lifting.    PERSONAL FACTORS Past/current experiences, Time since onset of injury/illness/exacerbation, and 3+ comorbidities:    are also affecting patient's functional outcome.    REHAB POTENTIAL: Good   CLINICAL DECISION MAKING: Stable/uncomplicated   EVALUATION COMPLEXITY: Low     GOALS: Goals reviewed with patient? No   SHORT TERM GOALS: Target date: 11/21/2021   Patient will be independent with initial home exercise program for self-management of symptoms. Baseline: Initial HEP to be provided at visit 2 as appropriate (11/07/21); initial HEP provided at visit 2 (11/09/2021);  Goal status: In-progress     LONG TERM GOALS: Target date: 01/30/2022   Patient will be independent with a long-term home exercise program for self-management of symptoms.  Baseline: Initial HEP to be provided at visit 2 as appropriate (11/07/21); initial HEP provided at visit 2 (11/09/2021);  Goal status: In-progress   2.  Patient will demonstrate improved FOTO by equal or greater than 10 points to demonstrate improvement in overall condition and self-reported functional ability.  Baseline:  to be tested at visit 2 as appropriate (11/07/21); 62 at visit #2 (11/09/2021);  Goal status: In-progress   3.  Patient will demonstrate ability to perform single leg stance for equal or greater than 30 seconds with no trendelenburg on each side to improve her ability to complete activities that require single leg stance such as walking and floor transfers.  Baseline: R= 7 seconds, legs touching, trendelenburg  present, L= 6 seconds, legs touching, trendelenburg present (11/07/21); Goal status: In-progress   4.  Patient will demonstrate ability to ambulate equal or greater than aged matched norm of 1765 feet during 6 Minute Walk Test to improve her community mobility.  Baseline: to be tested at visit 2 as appropriate (11/07/21); 1462 feet no AD, SpO2 97%, HR 127 BPM (11/09/2021);  Goal status: In-progress   5.  Patient will complete community, work and/or recreational activities without limitation due to current condition.  Baseline: difficulty with activities that require strength and endurance including walking, standing, playing with grandchildren, floor transfers, lifting (11/07/21); Goal status: In-progress   6.  Patient will complete Floor Transfer Test in equal or less than 8.8 seconds (cut off score for healthy elderly adult vs those with stroke) to demonstrate improved ability to get up and down from the floor to play with grandchildren.  Baseline: to be tested visit 2 as appropriate (11/07/2021); 8.94 seconds without use of chair (11/09/2021);   Goal status: In-progress   7.  Patient will complete floor to waist lift with 25# box 10 repetitions in a row to demonstrate improved ability to perform lifting at home.   Baseline: to be tested visit 2 as appropriate (11/07/2021);  up to 19.5# 1 rep. (L wrist and dyspnea limiting (11/09/2021);   Goal status: In-progress   PLAN: PT FREQUENCY: 1-2x/week   PT DURATION: 12 weeks   PLANNED INTERVENTIONS: Therapeutic exercises, Therapeutic activity, Neuromuscular re-education, Balance training, Gait training, Patient/Family education, Self Care, Joint mobilization, Stair training, Cryotherapy, Moist heat, Manual therapy, and Re-evaluation.   PLAN FOR NEXT SESSION: update HEP as appropriate, functional, LE, and UE strengthening and balance exercises.    Everlean Alstrom. Graylon Good, PT, DPT 12/12/21, 7:30 PM  [facility] [address] [place], [place] [address] P: [phone] I F: [phone]

## 2021-12-15 ENCOUNTER — Encounter: Payer: Self-pay | Admitting: Physical Therapy

## 2021-12-15 ENCOUNTER — Ambulatory Visit: Payer: BC Managed Care – PPO | Attending: Physician Assistant | Admitting: Physical Therapy

## 2021-12-15 DIAGNOSIS — M6281 Muscle weakness (generalized): Secondary | ICD-10-CM | POA: Insufficient documentation

## 2021-12-15 DIAGNOSIS — R262 Difficulty in walking, not elsewhere classified: Secondary | ICD-10-CM | POA: Insufficient documentation

## 2021-12-15 NOTE — Therapy (Signed)
OUTPATIENT PHYSICAL THERAPY TREATMENT NOTE   [name] MRN: [id number] DOB:April 02, 1960, 61 y.o., female Today's Date: 12/15/2021  PCP: Rusty Aus, MD REFERRING PROVIDER: Tresa Garter, PA  END OF SESSION:   PT End of Session - 12/15/21 1638     Visit Number 9    Number of Visits 24    Date for PT Re-Evaluation 01/30/22    Authorization Type VL: Based on [place]    Progress Note Due on Visit 10    PT Start Time 1605    PT Stop Time 1645    PT Time Calculation (min) 40 min    Activity Tolerance Patient tolerated treatment well    Behavior During Therapy WFL for tasks assessed/performed             Past Medical History:  Diagnosis Date   B12 deficiency    Endometrial cancer ([place])    GERD (gastroesophageal reflux disease)    History reviewed. No pertinent surgical history. 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 female who presents to outpatient physical therapy with a referral for medical diagnosis endometrial cancer, antineoplastic chemotherapy, deconditioning. This patient's chief complaints consist of deconditioning s/p cancer dx and treatment leading to the following functional deficits: difficulty with activities that require strength and endurance including walking, standing, playing with grandchildren, lifting, floor transfers. Relevant past medical  history and comorbidities include stroke (affected left arm temporarily), GERD, left hand weakness, dyslipidemia, B12 deficiency, endometrial cancer (stage IV), anemia.  Patient denies hx of seizures, lung problems, heart problems, diabetes, unexplained weight loss, unexplained changes in bowel or bladder problems, unexplained stumbling or dropping things, and spinal surgery, known osteoporosis.   PRECAUTIONS: from OT: don't grab small things.   SUBJECTIVE: Patent reports her hips and R foot are better. She went back to her old shoes and her right foot stopped hurting. She has been putting [employer] on her foot. She has done this for a long time for another spot of pain on the dorsal surface of the foot and it takes the pain away immediately. She has been sleeping with a pillow between her legs and she has slept well without being woken up over the lest two nights even though she finds her pillows on the floor. She reports no pain currently.   PAIN:  Are you having pain? Not currently.   OBJECTIVE   TODAY'S TREATMENT Therapeutic exercise: to centralize symptoms and improve ROM, strength, muscular endurance, and activity tolerance required for successful completion of functional activities.  - NuStep level 5 using bilateral upper and lower extremities. Seat/handle setting 10/10. For improved extremity mobility, muscular endurance, and activity tolerance; and to induce the analgesic effect of aerobic exercise, stimulate improved joint nutrition, and prepare body structures and systems for following interventions. x 5  minutes. Average SPM = 67. - STS from chair 17 inch chair: 2x10.  - Heel raises with heels over 2/4 board: 2x10. - standing B gastroc stretch on 20  degree slant board with B UE support. 3x30 seconds.  - hooklying bridge with 10# DB over her pelvis, 2x10.  - standing lateral walking with YTB looped around ankles, cuing to keep feet wide between steps. 1x5 feet each side.   - forwards/backwards monster walks with YTB around ankles, SBA for safety, 2x25 feet each direction.  - Bent over donkey kicks: 2x10, each side.                          - Standing scapular rows with 10# cable: 2x15.  - reverse mini-lunge with U UE support, 2x8 each side.    - examination of hip pain and foot pain (see above).  - Education on diagnosis, prognosis, POC, anatomy and physiology of current condition and HEP.    Pt required multimodal cuing for proper technique and to facilitate improved neuromuscular control, strength, range of motion, and functional ability resulting in improved performance and form.    PATIENT EDUCATION:  Exercise purpose/form. Self management techniques. Education on HEP including handout  Person educated: Patient Education method: Explanation, demonstration, tactile and verbal cuing Education comprehension: verbalized and demonstrated understanding and needs further education     HOME EXERCISE PROGRAM: Access Code: [email or web address] URL: [email or web address] Date: 11/09/2021 Prepared by: Rosita Kea  Exercises - Sit to Stand Without Arm Support  - 3-4 x weekly - 2 sets - 10 reps - Side Stepping with Resistance at Ankles  - 3-4 x weekly - 2 sets - 20 feet - Heel Raises with Counter Support  - 3-4 x weekly - 2 sets - 10 reps   ASSESSMENT:   CLINICAL IMPRESSION: Patient arrives with significant improvement in R foot and bilateral hip pain so session returned to focus on strengthening with decreased intensity to more gradually load sensitive tissues at hips and foot. Patient tolerated treatment well overall and felt appropriate fatigue by end of session. Modifications to decrease compressive loads on bilateral greater trochanters utilized to load lateral hip with less compression.. Patient would benefit from continued management of limiting condition by skilled physical therapist to address remaining impairments and functional limitations to  work towards stated goals and return to PLOF or maximal functional independence.    From Initial PT eval 11/07/2021:  Patient is a 61 y.o. female referred to outpatient physical therapy with a medical diagnosis of endometrial cancer, antineoplastic chemotherapy, deconditioning who presents with signs and symptoms consistent with generalized deconditioning weakness following cancer diagnosis and treatment. Patient also appears to have R > L sciatic nerve tension and history of hamstring strain that is likely affecting her strength and stability R LE more than left. Patient has significant weakness of bilateral hip abductors that negatively impact her ability to perform activities that require single leg stance such as floor transfers, walking, playing with her grandchildren, etc.  Patient presents with significant pain, muscle tension, muscle performance (strength/power/endurance), balance, and activity tolerance impairments that are limiting ability to complete her usual activities that require strength and endurance including walking, standing, lifting, playing with grandchildren, floor transfers without difficulty. Patient will benefit from skilled physical therapy intervention to address current body structure impairments and activity limitations to improve function and work towards goals set in current POC in order to return to prior level of function or maximal functional improvement.      OBJECTIVE IMPAIRMENTS cardiopulmonary status limiting activity, decreased activity tolerance, decreased balance, decreased endurance, decreased mobility, difficulty walking, decreased strength, impaired perceived functional ability, impaired flexibility,  obesity, and pain.    ACTIVITY LIMITATIONS carrying, lifting, standing, squatting, and stairs   PARTICIPATION LIMITATIONS: interpersonal relationship, community activity, and   activities that require strength and endurance including walking, standing, playing with  grandchildren, floor transfers, lifting.    PERSONAL FACTORS Past/current experiences, Time since onset of injury/illness/exacerbation, and 3+ comorbidities:    are also affecting patient's functional outcome.    REHAB POTENTIAL: Good   CLINICAL DECISION MAKING: Stable/uncomplicated   EVALUATION COMPLEXITY: Low     GOALS: Goals reviewed with patient? No   SHORT TERM GOALS: Target date: 11/21/2021   Patient will be independent with initial home exercise program for self-management of symptoms. Baseline: Initial HEP to be provided at visit 2 as appropriate (11/07/21); initial HEP provided at visit 2 (11/09/2021);  Goal status: In-progress     LONG TERM GOALS: Target date: 01/30/2022   Patient will be independent with a long-term home exercise program for self-management of symptoms.  Baseline: Initial HEP to be provided at visit 2 as appropriate (11/07/21); initial HEP provided at visit 2 (11/09/2021);  Goal status: In-progress   2.  Patient will demonstrate improved FOTO by equal or greater than 10 points to demonstrate improvement in overall condition and self-reported functional ability.  Baseline: to be tested at visit 2 as appropriate (11/07/21); 62 at visit #2 (11/09/2021);  Goal status: In-progress   3.  Patient will demonstrate ability to perform single leg stance for equal or greater than 30 seconds with no trendelenburg on each side to improve her ability to complete activities that require single leg stance such as walking and floor transfers.  Baseline: R= 7 seconds, legs touching, trendelenburg present, L= 6 seconds, legs touching, trendelenburg present (11/07/21); Goal status: In-progress   4.  Patient will demonstrate ability to ambulate equal or greater than aged matched norm of 1765 feet during 6 Minute Walk Test to improve her community mobility.  Baseline: to be tested at visit 2 as appropriate (11/07/21); 1462 feet no AD, SpO2 97%, HR 127 BPM (11/09/2021);  Goal  status: In-progress   5.  Patient will complete community, work and/or recreational activities without limitation due to current condition.  Baseline: difficulty with activities that require strength and endurance including walking, standing, playing with grandchildren, floor transfers, lifting (11/07/21); Goal status: In-progress   6.  Patient will complete Floor Transfer Test in equal or less than 8.8 seconds (cut off score for healthy elderly adult vs those with stroke) to demonstrate improved ability to get up and down from the floor to play with grandchildren.  Baseline: to be tested visit 2 as appropriate (11/07/2021); 8.94 seconds without use of chair (11/09/2021);   Goal status: In-progress   7.  Patient will complete floor to waist lift with 25# box 10 repetitions in a row to demonstrate improved ability to perform lifting at home.   Baseline: to be tested visit 2 as appropriate (11/07/2021);  up to 19.5# 1 rep. (L wrist and dyspnea limiting (11/09/2021);   Goal status: In-progress   PLAN: PT FREQUENCY: 1-2x/week   PT DURATION: 12 weeks   PLANNED INTERVENTIONS: Therapeutic exercises, Therapeutic activity, Neuromuscular re-education, Balance training, Gait training, Patient/Family education, Self Care, Joint mobilization, Stair training, Cryotherapy, Moist heat, Manual therapy, and Re-evaluation.   PLAN FOR NEXT SESSION: update HEP as appropriate, functional, LE, and UE strengthening and balance exercises.    Everlean Alstrom. Graylon Good, PT, DPT 12/15/21, 4:44 PM  [facility] [place],  [place] [address] P: [phone] I F: [phone]

## 2021-12-20 ENCOUNTER — Ambulatory Visit: Payer: BC Managed Care – PPO | Admitting: Physical Therapy

## 2021-12-20 DIAGNOSIS — M6281 Muscle weakness (generalized): Secondary | ICD-10-CM

## 2021-12-20 DIAGNOSIS — R262 Difficulty in walking, not elsewhere classified: Secondary | ICD-10-CM

## 2021-12-20 NOTE — Therapy (Unsigned)
OUTPATIENT PHYSICAL THERAPY TREATMENT NOTE   [name] MRN: [id number] DOB:1960-08-30, 61 y.o., female Today's Date: 12/20/2021  PCP: Rusty Aus, MD REFERRING PROVIDER: Tresa Garter, PA  END OF SESSION:     Past Medical History:  Diagnosis Date   B12 deficiency    Endometrial cancer ([place])    GERD (gastroesophageal reflux disease)    No past surgical history on file. 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  Rationale for Evaluation and Treatment: Rehabilitation  PERTINENT HISTORY: Patient is a 61 y.o. female who presents to outpatient physical therapy with a referral for medical diagnosis endometrial cancer, antineoplastic chemotherapy, deconditioning. This patient's chief complaints consist of deconditioning s/p cancer dx and treatment leading to the following functional deficits: difficulty with activities that require strength and endurance including walking, standing, playing with grandchildren, lifting, floor transfers. Relevant past medical history and comorbidities include stroke (affected left arm temporarily), GERD, left hand weakness, dyslipidemia, B12 deficiency, endometrial cancer (stage IV), anemia.  Patient denies hx of seizures, lung problems, heart problems, diabetes, unexplained weight loss, unexplained changes in bowel or bladder problems, unexplained stumbling or dropping things, and spinal surgery, known osteoporosis.   PRECAUTIONS: from OT: don't grab small things.   SUBJECTIVE: Patent reports she  did way too much over the weekend after having a new grandson and her husband was gone on a retreat. She felt like she just "shut down" by Sunday. She has Friday coming up off school and she is looking forward to it. She states yesterday was a rough day because she was sore all over. Today has been better and she currently has no pain. Her hips and foot have been better.   PAIN:  Are you having pain? Not currently.    144 94%  1640 OBJECTIVE  SELF-REPORTED FUNCTION FOTO score: 60/100 (cancer questionnaire)  FUNCTIONAL/BALANCE TESTS 6 Minute Walk Test: 1462 feet no AD, SpO2 97%, HR 127 BPM.   Single leg stance, firm surface, eyes open: R= 5 seconds, legs touching L= 12 seconds, legs touching, trendelenburg present    Floor Transfer Test (average of 3 trials): 7.04 seconds without use of chair.  Trial 1: 7.31 seconds Trial 2: 7 seconds Trial 3: 6.81 seconds   Floor to waist box lift: up to 19.5# 1 rep. (L wrist and dyspnea limiting (plans to wear wrist brace next time).    TODAY'S TREATMENT Therapeutic exercise: to centralize symptoms and improve ROM, strength, muscular endurance, and activity tolerance required for successful completion of functional activities.  - NuStep level 5 using bilateral upper and lower extremities. Seat/handle setting 10/10. For improved extremity mobility, muscular endurance, and activity tolerance; and to induce the analgesic effect of aerobic exercise, stimulate improved joint nutrition, and prepare body structures and systems for following interventions. x 5  minutes. Average SPM = 67. - STS from chair 17 inch chair: 2x10.   - hooklying bridge with 10# DB over her pelvis, 2x10.   - Heel raises with heels over 2/4 board: 2x10. - standing B gastroc stretch on 20 degree slant board with B UE support. 3x30 seconds.   -  standing lateral walking with YTB looped around ankles, cuing to keep feet wide between steps. 1x5 feet each side.    -  forwards/backwards monster walks with YTB around ankles, SBA for safety, 2x25 feet each direction.    - Bent over donkey kicks: 2x10, each side.                          - Standing scapular rows with 10# cable: 2x15.  - reverse mini-lunge with U UE support, 2x8 each side.    - examination of hip pain and foot pain (see above).  - Education on diagnosis, prognosis, POC, anatomy and physiology of current condition and HEP.    Pt required multimodal cuing for proper technique and to facilitate improved neuromuscular control, strength, range of motion, and functional ability resulting in improved performance and form.    PATIENT EDUCATION:  Exercise purpose/form. Self management techniques. Education on HEP including handout  Person educated: Patient Education method: Explanation, demonstration, tactile and verbal cuing Education comprehension: verbalized and demonstrated understanding and needs further education     HOME EXERCISE PROGRAM: Access Code: [email or web address] URL: [email or web address] Date: 11/09/2021 Prepared by: Rosita Kea  Exercises - Sit to Stand Without Arm Support  - 3-4 x weekly - 2 sets - 10 reps - Side Stepping with Resistance at Ankles  - 3-4 x weekly - 2 sets - 20 feet - Heel Raises with Counter Support  - 3-4 x weekly - 2 sets - 10 reps   ASSESSMENT:   CLINICAL IMPRESSION: Patient arrives with significant improvement in R foot and bilateral hip pain so session returned to focus on strengthening with decreased intensity to more gradually load sensitive tissues at hips and foot. Patient tolerated treatment well overall and felt appropriate fatigue by end of session. Modifications to decrease compressive loads on bilateral greater trochanters utilized to load lateral hip with less compression.. Patient would benefit from continued management of limiting condition by skilled physical therapist to address remaining impairments and functional limitations to  work towards stated goals and return to PLOF or maximal functional independence.    From Initial PT eval 11/07/2021:  Patient is a 61 y.o. female referred to outpatient physical therapy with a medical diagnosis of endometrial cancer, antineoplastic chemotherapy, deconditioning who presents with signs and symptoms consistent with generalized deconditioning weakness following cancer diagnosis and treatment. Patient also appears to have R > L sciatic nerve tension and history of hamstring strain that is likely affecting her strength and stability R LE more than left. Patient has significant weakness of bilateral hip abductors that negatively impact her ability to perform activities that require single leg stance such as floor transfers, walking, playing with her grandchildren, etc.  Patient presents with significant pain, muscle tension, muscle performance (strength/power/endurance), balance, and activity tolerance impairments that are limiting ability to complete her usual activities that require strength and endurance including walking, standing, lifting, playing with grandchildren, floor transfers without difficulty. Patient will benefit from skilled physical therapy intervention to address current body structure impairments and activity limitations to improve function and work towards goals set in current POC in order to return to prior level of function or maximal functional improvement.      OBJECTIVE IMPAIRMENTS cardiopulmonary status limiting activity, decreased activity tolerance, decreased balance, decreased endurance, decreased mobility, difficulty walking, decreased strength, impaired perceived functional ability, impaired flexibility, obesity, and pain.    ACTIVITY LIMITATIONS carrying, lifting, standing, squatting, and stairs   PARTICIPATION LIMITATIONS:  interpersonal relationship, community activity, and   activities that require strength and endurance including walking, standing, playing with  grandchildren, floor transfers, lifting.    PERSONAL FACTORS Past/current experiences, Time since onset of injury/illness/exacerbation, and 3+ comorbidities:    are also affecting patient's functional outcome.    REHAB POTENTIAL: Good   CLINICAL DECISION MAKING: Stable/uncomplicated   EVALUATION COMPLEXITY: Low     GOALS: Goals reviewed with patient? No   SHORT TERM GOALS: Target date: 11/21/2021   Patient will be independent with initial home exercise program for self-management of symptoms. Baseline: Initial HEP to be provided at visit 2 as appropriate (11/07/21); initial HEP provided at visit 2 (11/09/2021);  Goal status: In-progress     LONG TERM GOALS: Target date: 01/30/2022   Patient will be independent with a long-term home exercise program for self-management of symptoms.  Baseline: Initial HEP to be provided at visit 2 as appropriate (11/07/21); initial HEP provided at visit 2 (11/09/2021);  Goal status: In-progress   2.  Patient will demonstrate improved FOTO by equal or greater than 10 points to demonstrate improvement in overall condition and self-reported functional ability.  Baseline: to be tested at visit 2 as appropriate (11/07/21); 62 at visit #2 (11/09/2021);  Goal status: In-progress   3.  Patient will demonstrate ability to perform single leg stance for equal or greater than 30 seconds with no trendelenburg on each side to improve her ability to complete activities that require single leg stance such as walking and floor transfers.  Baseline: R= 7 seconds, legs touching, trendelenburg present, L= 6 seconds, legs touching, trendelenburg present (11/07/21); Goal status: In-progress   4.  Patient will demonstrate ability to ambulate equal or greater than aged matched norm of 1765 feet during 6 Minute Walk Test to improve her community mobility.  Baseline: to be tested at visit 2 as appropriate (11/07/21); 1462 feet no AD, SpO2 97%, HR 127 BPM (11/09/2021);  Goal  status: In-progress   5.  Patient will complete community, work and/or recreational activities without limitation due to current condition.  Baseline: difficulty with activities that require strength and endurance including walking, standing, playing with grandchildren, floor transfers, lifting (11/07/21); Goal status: In-progress   6.  Patient will complete Floor Transfer Test in equal or less than 8.8 seconds (cut off score for healthy elderly adult vs those with stroke) to demonstrate improved ability to get up and down from the floor to play with grandchildren.  Baseline: to be tested visit 2 as appropriate (11/07/2021); 8.94 seconds without use of chair (11/09/2021);   Goal status: In-progress   7.  Patient will complete floor to waist lift with 25# box 10 repetitions in a row to demonstrate improved ability to perform lifting at home.   Baseline: to be tested visit 2 as appropriate (11/07/2021);  up to 19.5# 1 rep. (L wrist and dyspnea limiting (11/09/2021);   Goal status: In-progress   PLAN: PT FREQUENCY: 1-2x/week   PT DURATION: 12 weeks   PLANNED INTERVENTIONS: Therapeutic exercises, Therapeutic activity, Neuromuscular re-education, Balance training, Gait training, Patient/Family education, Self Care, Joint mobilization, Stair training, Cryotherapy, Moist heat, Manual therapy, and Re-evaluation.   PLAN FOR NEXT SESSION: update HEP as appropriate, functional, LE, and UE strengthening and balance exercises.    Everlean Alstrom. Graylon Good, PT, DPT 12/20/21, 4:12 PM  [facility] [address] [place], [place] [address] P: [phone] I F: [phone]

## 2021-12-21 ENCOUNTER — Encounter: Payer: Self-pay | Admitting: Physical Therapy

## 2021-12-22 ENCOUNTER — Ambulatory Visit: Payer: BC Managed Care – PPO | Admitting: Physical Therapy

## 2021-12-26 ENCOUNTER — Ambulatory Visit: Payer: BC Managed Care – PPO | Admitting: Physical Therapy

## 2021-12-26 ENCOUNTER — Encounter: Payer: Self-pay | Admitting: Physical Therapy

## 2021-12-26 DIAGNOSIS — M6281 Muscle weakness (generalized): Secondary | ICD-10-CM

## 2021-12-26 DIAGNOSIS — R262 Difficulty in walking, not elsewhere classified: Secondary | ICD-10-CM

## 2021-12-26 NOTE — Therapy (Signed)
OUTPATIENT PHYSICAL THERAPY TREATMENT NOTE   [name] MRN: [id number] DOB:February 03, 1961, 61 y.o., female Today's Date: 12/26/21  PCP: Rusty Aus, MD REFERRING PROVIDER: Tresa Garter, PA  END OF SESSION:   PT End of Session - 12/26/21 1609     Visit Number 11    Number of Visits 24    Date for PT Re-Evaluation 01/30/22    Authorization Type [employer] reporting period from 12/20/2021    Progress Note Due on Visit 10    PT Start Time 1603    PT Stop Time 1634    PT Time Calculation (min) 31 min    Activity Tolerance Patient tolerated treatment well    Behavior During Therapy WFL for tasks assessed/performed               Past Medical History:  Diagnosis Date   B12 deficiency    Endometrial cancer ([place])    GERD (gastroesophageal reflux disease)    History reviewed. No pertinent surgical history. 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 female who presents to outpatient physical therapy with a referral for medical diagnosis endometrial cancer, antineoplastic chemotherapy, deconditioning. This patient's chief complaints consist of deconditioning s/p cancer dx and treatment leading to the following functional deficits: difficulty with activities that require strength and endurance including walking, standing, playing with grandchildren, lifting,  floor transfers. Relevant past medical history and comorbidities include stroke (affected left arm temporarily), GERD, left hand weakness, dyslipidemia, B12 deficiency, endometrial cancer (stage IV), anemia.  Patient denies hx of seizures, lung problems, heart problems, diabetes, unexplained weight loss, unexplained changes in bowel or bladder problems, unexplained stumbling or dropping things, and spinal surgery, known osteoporosis.   PRECAUTIONS: from OT: don't grab small things.   SUBJECTIVE: Patent reports her left posterior knee pain has returned today. She spent the weekend putting up holiday decorations. She has a colonoscopy coming up but when she went for the pre-op they thought she might need to have it in the OR instead of the ambulatory center because of her history. She states her HEP was okay until she did a lot of bending and lifting while decorating at home.   PAIN:  Are you having pain? NPRS posterior left knee 0.5/10.   OBJECTIVE  TODAY'S TREATMENT Therapeutic exercise: to centralize symptoms and improve ROM, strength, muscular endurance, and activity tolerance required for successful completion of functional activities.  - NuStep level 5 using bilateral upper and lower extremities. Seat/handle setting 10/10. For improved extremity mobility, muscular endurance, and activity tolerance; and to induce the analgesic effect of aerobic exercise, stimulate improved joint nutrition, and prepare body structures and systems for following interventions. x 5  minutes. Average SPM = 88. - STS from chair 17 inch chair: 2x10.  - Heel raises with heels over 2/4 board: 2x10. - standing B gastroc stretch on 20 degree slant board with B UE support. 3x30 seconds.  - lateral waking 3x21 feet each direction with YTB  around ankles.  - Standing scapular rows with 10# cable: 2x15. - forwards/backwards monster walks with YTB around ankles, SBA for safety, 3x21 feet each direction.   Pt required  multimodal cuing for proper technique and to facilitate improved neuromuscular control, strength, range of motion, and functional ability resulting in improved performance and form.    PATIENT EDUCATION:  Exercise purpose/form. Self management techniques. Education on HEP including handout  Person educated: Patient Education method: Explanation, demonstration, tactile and verbal cuing Education comprehension: verbalized and demonstrated understanding and needs further education     HOME EXERCISE PROGRAM: Access Code: [email or web address] URL: [email or web address] Date: 11/09/2021 Prepared by: Rosita Kea  Exercises - Sit to Stand Without Arm Support  - 3-4 x weekly - 2 sets - 10 reps - Side Stepping with Resistance at Ankles  - 3-4 x weekly - 2 sets - 20 feet - Heel Raises with Counter Support  - 3-4 x weekly - 2 sets - 10 reps   ASSESSMENT:   CLINICAL IMPRESSION: Patient arrives with left posterior knee pain and tolerated treatment well with no increase in pain by end of session. Session stopped early due to overall fatigue and quick completion of planned exercises. Patient continues to demonstrate limited activity tolerance and requires skilled PT to optimize strengthening. Patient would benefit from continued management of limiting condition by skilled physical therapist to address remaining impairments and functional limitations to work towards stated goals and return to PLOF or maximal functional independence.   From Initial PT eval 11/07/2021:  Patient is a 61 y.o. female referred to outpatient physical therapy with a medical diagnosis of endometrial cancer, antineoplastic chemotherapy, deconditioning who presents with signs and symptoms consistent with generalized deconditioning weakness following cancer diagnosis and treatment. Patient also appears to have R > L sciatic nerve tension and history of hamstring strain that is likely affecting her strength and stability R LE more  than left. Patient has significant weakness of bilateral hip abductors that negatively impact her ability to perform activities that require single leg stance such as floor transfers, walking, playing with her grandchildren, etc.  Patient presents with significant pain, muscle tension, muscle performance (strength/power/endurance), balance, and activity tolerance impairments that are limiting ability to complete her usual activities that require strength and endurance including walking, standing, lifting, playing with grandchildren, floor transfers without difficulty. Patient will benefit from skilled physical therapy intervention to address current body structure impairments and activity limitations to improve function and work towards goals set in current POC in order to return to prior level of function or maximal functional improvement.      OBJECTIVE IMPAIRMENTS cardiopulmonary status limiting activity, decreased activity tolerance, decreased balance, decreased endurance, decreased mobility, difficulty walking, decreased strength, impaired perceived functional ability, impaired flexibility, obesity, and pain.    ACTIVITY LIMITATIONS carrying, lifting, standing, squatting, and stairs   PARTICIPATION LIMITATIONS: interpersonal relationship, community activity, and   activities that require strength and endurance including walking, standing, playing with grandchildren, floor transfers, lifting.    PERSONAL FACTORS Past/current experiences, Time since onset of injury/illness/exacerbation, and 3+ comorbidities:    are also affecting patient's functional outcome.    REHAB POTENTIAL: Good   CLINICAL DECISION MAKING: Stable/uncomplicated   EVALUATION COMPLEXITY: Low     GOALS: Goals reviewed with patient? No   SHORT TERM GOALS: Target date: 11/21/2021   Patient will be independent with initial home exercise program for self-management of symptoms. Baseline: Initial HEP to be provided at visit 2  as appropriate (11/07/21); initial HEP  provided at visit 2 (11/09/2021);  Goal status: MET     LONG TERM GOALS: Target date: 01/30/2022. Updated to 03/14/2022 on 12/20/2021 for all unmet goals.    Patient will be independent with a long-term home exercise program for self-management of symptoms.  Baseline: Initial HEP to be provided at visit 2 as appropriate (11/07/21); initial HEP provided at visit 2 (11/09/2021); participating well (12/20/2021);  Goal status: In-progress   2.  Patient will demonstrate improved FOTO by equal or greater than 10 points to demonstrate improvement in overall condition and self-reported functional ability.  Baseline: to be tested at visit 2 as appropriate (11/07/21); 62 at visit #2 (11/09/2021); 60 at visit #10 (12/20/2021);  Goal status: On-going   3.  Patient will demonstrate ability to perform single leg stance for equal or greater than 30 seconds with no trendelenburg on each side to improve her ability to complete activities that require single leg stance such as walking and floor transfers.  Baseline: R= 7 seconds, legs touching, trendelenburg present, L= 6 seconds, legs touching, trendelenburg present (11/07/21); R= 5 seconds, legs touching, less trendelenburg,  L= 12 seconds, legs touching, trendelenburg present (12/20/2021);  Goal status: In-progress   4.  Patient will demonstrate ability to ambulate equal or greater than aged matched norm of 1765 feet during 6 Minute Walk Test to improve her community mobility.  Baseline: to be tested at visit 2 as appropriate (11/07/21); 1462 feet no AD, SpO2 97%, HR 127 BPM (11/09/2021); 1640 feet no AD, SpO2 94%, HR 144 BPM (12/20/2021);  Goal status: In-progress   5.  Patient will complete community, work and/or recreational activities without limitation due to current condition.  Baseline: difficulty with activities that require strength and endurance including walking, standing, playing with grandchildren, floor transfers,  lifting (11/07/21); notes improvements but continues to have limitations (12/20/2021);  Goal status: In-progress   6.  Patient will complete Floor Transfer Test in equal or less than 8.8 seconds (cut off score for healthy elderly adult vs those with stroke) to demonstrate improved ability to get up and down from the floor to play with grandchildren.  Baseline: to be tested visit 2 as appropriate (11/07/2021); 8.94 seconds without use of chair (11/09/2021);  7.04 seconds without use of chair (12/20/2021);  Goal status: MET   7.  Patient will complete floor to waist lift with 25# box 10 repetitions in a row to demonstrate improved ability to perform lifting at home.   Baseline: to be tested visit 2 as appropriate (11/07/2021);  up to 19.5# 1 rep. (L wrist and dyspnea limiting (11/09/2021);  DISCONTINUED due to limitations from wrist condition (12/20/2021);  Goal status: DISCONTINUED 12/20/2021   PLAN: PT FREQUENCY: 1-2x/week   PT DURATION: 12 weeks   PLANNED INTERVENTIONS: Therapeutic exercises, Therapeutic activity, Neuromuscular re-education, Balance training, Gait training, Patient/Family education, Self Care, Joint mobilization, Stair training, Cryotherapy, Moist heat, Manual therapy, and Re-evaluation.   PLAN FOR NEXT SESSION: update HEP as appropriate, functional, LE, and UE strengthening and balance exercises.    Everlean Alstrom. Graylon Good, PT, DPT 12/26/21, 4:39 PM  [facility] [address] [place], [place] [address] P: [phone] I F: [phone]

## 2021-12-28 ENCOUNTER — Ambulatory Visit: Payer: BC Managed Care – PPO | Admitting: Physical Therapy

## 2022-01-02 ENCOUNTER — Encounter: Payer: BC Managed Care – PPO | Admitting: Physical Therapy

## 2022-01-04 ENCOUNTER — Encounter: Payer: BC Managed Care – PPO | Admitting: Physical Therapy

## 2022-01-09 ENCOUNTER — Ambulatory Visit: Payer: BC Managed Care – PPO | Admitting: Physical Therapy

## 2022-01-11 ENCOUNTER — Encounter: Payer: BC Managed Care – PPO | Admitting: Physical Therapy

## 2022-01-16 ENCOUNTER — Encounter: Payer: Self-pay | Admitting: Physical Therapy

## 2022-01-16 ENCOUNTER — Ambulatory Visit: Payer: BC Managed Care – PPO | Attending: Physician Assistant | Admitting: Physical Therapy

## 2022-01-16 DIAGNOSIS — M6281 Muscle weakness (generalized): Secondary | ICD-10-CM | POA: Diagnosis present

## 2022-01-16 DIAGNOSIS — R262 Difficulty in walking, not elsewhere classified: Secondary | ICD-10-CM | POA: Insufficient documentation

## 2022-01-16 NOTE — Therapy (Signed)
OUTPATIENT PHYSICAL THERAPY TREATMENT NOTE   [name] MRN: [id number] DOB:July 17, 1960, 61 y.o., female Today's Date: 01/16/22  PCP: Rusty Aus, MD REFERRING PROVIDER: Tresa Garter, PA  END OF SESSION:   PT End of Session - 01/16/22 1629     Visit Number 12    Number of Visits 24    Date for PT Re-Evaluation 01/30/22    Authorization Type [employer] reporting period from 12/20/2021    Progress Note Due on Visit 10    PT Start Time 1602    PT Stop Time 1640    PT Time Calculation (min) 38 min    Activity Tolerance Patient tolerated treatment well    Behavior During Therapy WFL for tasks assessed/performed               Past Medical History:  Diagnosis Date   B12 deficiency    Endometrial cancer ([place])    GERD (gastroesophageal reflux disease)    History reviewed. No pertinent surgical history. 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 female who presents to outpatient physical therapy with a referral for medical diagnosis endometrial cancer, antineoplastic chemotherapy, deconditioning. This patient's chief complaints consist of deconditioning s/p cancer dx and treatment leading to the following functional deficits: difficulty with activities that require strength and endurance including walking, standing, playing with grandchildren, lifting,  floor transfers. Relevant past medical history and comorbidities include stroke (affected left arm temporarily), GERD, left hand weakness, dyslipidemia, B12 deficiency, endometrial cancer (stage IV), anemia.  Patient denies hx of seizures, lung problems, heart problems, diabetes, unexplained weight loss, unexplained changes in bowel or bladder problems, unexplained stumbling or dropping things, and spinal surgery, known osteoporosis.   PRECAUTIONS: from OT: don't grab small things.   SUBJECTIVE: Patent reports she is feeling well today with no pain currently. She states she has continued to have some trouble in the back of her left knee but she can keep the pain lower if she doesn't let it go to full extension or hyperextension. She has not been to PT since 12/26/2021 due to effects of colonoscopy and being worried she was coming down with something but it turned out to be gastric reflux. She has been doing some HEP but has struggled due to the previous reasons. Her colonoscopy was clear. She had a BMD scan recently that showed loss since the last one 2 years ago but is still in normal range. She has not discussed it with her doctors yet.   PAIN:  Are you having pain? NPRS posterior left knee 0/10.   OBJECTIVE  TODAY'S TREATMENT Therapeutic exercise: to centralize symptoms and improve ROM, strength, muscular endurance, and activity tolerance required for successful completion of functional activities.  - NuStep level 1 using bilateral upper and lower extremities. Seat/handle setting 10/10. For improved extremity mobility, muscular endurance, and activity tolerance; and to induce the analgesic effect of aerobic exercise, stimulate improved joint nutrition, and prepare body structures and systems for  following interventions. x 5  minutes. Average SPM = 96. - STS from chair 17 inch chair: 2x10.  - Heel raises with heels over 2/4 board: 2x15. - standing B gastroc stretch on 20 degree slant board with B  UE support. 3x30 seconds.   Superset:  - lateral waking 3x21 feet each direction with YTB around ankles.  - forwards/backwards monster walks with YTB around ankles, SBA for safety, 3x21 feet each direction.   - Standing scapular rows with 10# cable: 2x15.  Pt required multimodal cuing for proper technique and to facilitate improved neuromuscular control, strength, range of motion, and functional ability resulting in improved performance and form.    PATIENT EDUCATION:  Exercise purpose/form. Self management techniques. Person educated: Patient Education method: Explanation, demonstration, tactile and verbal cuing Education comprehension: verbalized and demonstrated understanding and needs further education     HOME EXERCISE PROGRAM: Access Code: [email or web address] URL: [email or web address] Date: 11/09/2021 Prepared by: Rosita Kea  Exercises - Sit to Stand Without Arm Support  - 3-4 x weekly - 2 sets - 10 reps - Side Stepping with Resistance at Ankles  - 3-4 x weekly - 2 sets - 20 feet - Heel Raises with Counter Support  - 3-4 x weekly - 2 sets - 10 reps   ASSESSMENT:   CLINICAL IMPRESSION: Patient arrives feeling well with no knee pain upon arrival. She tolerated treatment well with no increase in pain by end of session. She did need a seated rest after banded exercises and was provided extended rest breaks between exercises. Exercises not progressed today since it has been several weeks since she has been to PT. Plan to advance exercises next PT session as tolerance permits. Patient would benefit from continued management of limiting condition by skilled physical therapist to address remaining impairments and functional limitations to work towards stated goals and return to PLOF or maximal functional independence.   From Initial PT eval 11/07/2021:  Patient is a 61 y.o. female referred to outpatient physical therapy with a medical diagnosis of endometrial cancer,  antineoplastic chemotherapy, deconditioning who presents with signs and symptoms consistent with generalized deconditioning weakness following cancer diagnosis and treatment. Patient also appears to have R > L sciatic nerve tension and history of hamstring strain that is likely affecting her strength and stability R LE more than left. Patient has significant weakness of bilateral hip abductors that negatively impact her ability to perform activities that require single leg stance such as floor transfers, walking, playing with her grandchildren, etc.  Patient presents with significant pain, muscle tension, muscle performance (strength/power/endurance), balance, and activity tolerance impairments that are limiting ability to complete her usual activities that require strength and endurance including walking, standing, lifting, playing with grandchildren, floor transfers without difficulty. Patient will benefit from skilled physical therapy intervention to address current body structure impairments and activity limitations to improve function and work towards goals set in current POC in order to return to prior level of function or maximal functional improvement.      OBJECTIVE IMPAIRMENTS cardiopulmonary status limiting activity, decreased activity tolerance, decreased balance, decreased endurance, decreased mobility, difficulty walking, decreased strength, impaired perceived functional ability, impaired flexibility, obesity, and pain.    ACTIVITY LIMITATIONS carrying, lifting, standing, squatting, and stairs   PARTICIPATION LIMITATIONS: interpersonal relationship, community activity, and   activities that require strength and endurance including walking, standing, playing with grandchildren, floor transfers, lifting.    PERSONAL FACTORS Past/current experiences, Time since onset of injury/illness/exacerbation, and 3+ comorbidities:  are also affecting patient's functional outcome.    REHAB POTENTIAL:  Good   CLINICAL DECISION MAKING: Stable/uncomplicated   EVALUATION COMPLEXITY: Low     GOALS: Goals reviewed with patient? No   SHORT TERM GOALS: Target date: 11/21/2021   Patient will be independent with initial home exercise program for self-management of symptoms. Baseline: Initial HEP to be provided at visit 2 as appropriate (11/07/21); initial HEP provided at visit 2 (11/09/2021);  Goal status: MET     LONG TERM GOALS: Target date: 01/30/2022. Updated to 03/14/2022 on 12/20/2021 for all unmet goals.    Patient will be independent with a long-term home exercise program for self-management of symptoms.  Baseline: Initial HEP to be provided at visit 2 as appropriate (11/07/21); initial HEP provided at visit 2 (11/09/2021); participating well (12/20/2021);  Goal status: In-progress   2.  Patient will demonstrate improved FOTO by equal or greater than 10 points to demonstrate improvement in overall condition and self-reported functional ability.  Baseline: to be tested at visit 2 as appropriate (11/07/21); 62 at visit #2 (11/09/2021); 60 at visit #10 (12/20/2021);  Goal status: On-going   3.  Patient will demonstrate ability to perform single leg stance for equal or greater than 30 seconds with no trendelenburg on each side to improve her ability to complete activities that require single leg stance such as walking and floor transfers.  Baseline: R= 7 seconds, legs touching, trendelenburg present, L= 6 seconds, legs touching, trendelenburg present (11/07/21); R= 5 seconds, legs touching, less trendelenburg,  L= 12 seconds, legs touching, trendelenburg present (12/20/2021);  Goal status: In-progress   4.  Patient will demonstrate ability to ambulate equal or greater than aged matched norm of 1765 feet during 6 Minute Walk Test to improve her community mobility.  Baseline: to be tested at visit 2 as appropriate (11/07/21); 1462 feet no AD, SpO2 97%, HR 127 BPM (11/09/2021); 1640 feet no AD, SpO2  94%, HR 144 BPM (12/20/2021);  Goal status: In-progress   5.  Patient will complete community, work and/or recreational activities without limitation due to current condition.  Baseline: difficulty with activities that require strength and endurance including walking, standing, playing with grandchildren, floor transfers, lifting (11/07/21); notes improvements but continues to have limitations (12/20/2021);  Goal status: In-progress   6.  Patient will complete Floor Transfer Test in equal or less than 8.8 seconds (cut off score for healthy elderly adult vs those with stroke) to demonstrate improved ability to get up and down from the floor to play with grandchildren.  Baseline: to be tested visit 2 as appropriate (11/07/2021); 8.94 seconds without use of chair (11/09/2021);  7.04 seconds without use of chair (12/20/2021);  Goal status: MET   7.  Patient will complete floor to waist lift with 25# box 10 repetitions in a row to demonstrate improved ability to perform lifting at home.   Baseline: to be tested visit 2 as appropriate (11/07/2021);  up to 19.5# 1 rep. (L wrist and dyspnea limiting (11/09/2021);  DISCONTINUED due to limitations from wrist condition (12/20/2021);  Goal status: DISCONTINUED 12/20/2021   PLAN: PT FREQUENCY: 1-2x/week   PT DURATION: 12 weeks   PLANNED INTERVENTIONS: Therapeutic exercises, Therapeutic activity, Neuromuscular re-education, Balance training, Gait training, Patient/Family education, Self Care, Joint mobilization, Stair training, Cryotherapy, Moist heat, Manual therapy, and Re-evaluation.   PLAN FOR NEXT SESSION: update HEP as appropriate, functional, LE, and UE strengthening and balance exercises.    Everlean Alstrom. Graylon Good, PT, DPT 01/16/22, 4:47 PM  [facility] [address] [place], [place] [address] P: [phone] I F: [phone]

## 2022-01-18 ENCOUNTER — Ambulatory Visit: Payer: BC Managed Care – PPO | Admitting: Physical Therapy

## 2022-01-23 ENCOUNTER — Ambulatory Visit: Payer: BC Managed Care – PPO | Admitting: Physical Therapy

## 2022-01-25 ENCOUNTER — Encounter: Payer: Self-pay | Admitting: Physical Therapy

## 2022-01-25 ENCOUNTER — Ambulatory Visit: Payer: BC Managed Care – PPO | Admitting: Physical Therapy

## 2022-01-25 DIAGNOSIS — M6281 Muscle weakness (generalized): Secondary | ICD-10-CM | POA: Diagnosis not present

## 2022-01-25 DIAGNOSIS — R262 Difficulty in walking, not elsewhere classified: Secondary | ICD-10-CM

## 2022-01-25 NOTE — Therapy (Signed)
OUTPATIENT PHYSICAL THERAPY TREATMENT NOTE   [name] MRN: [id number] DOB:Jun 23, 1960, 61 y.o., female Today's Date: 01/25/22  PCP: Rusty Aus, MD REFERRING PROVIDER: Tresa Garter, PA  END OF SESSION:   PT End of Session - 01/25/22 1616     Visit Number 13    Number of Visits 24    Date for PT Re-Evaluation 01/30/22    Authorization Type [employer] reporting period from 12/20/2021    Progress Note Due on Visit 10    PT Start Time 1601    PT Stop Time 1640    PT Time Calculation (min) 39 min    Activity Tolerance Patient tolerated treatment well    Behavior During Therapy WFL for tasks assessed/performed                Past Medical History:  Diagnosis Date   B12 deficiency    Endometrial cancer ([place])    GERD (gastroesophageal reflux disease)    History reviewed. No pertinent surgical history. 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 female who presents to outpatient physical therapy with a referral for medical diagnosis endometrial cancer, antineoplastic chemotherapy, deconditioning. This patient's chief complaints consist of deconditioning s/p cancer dx and treatment leading to the following functional deficits: difficulty with activities that require strength and endurance including walking, standing, playing with grandchildren,  lifting, floor transfers. Relevant past medical history and comorbidities include stroke (affected left arm temporarily), GERD, left hand weakness, dyslipidemia, B12 deficiency, endometrial cancer (stage IV), anemia.  Patient denies hx of seizures, lung problems, heart problems, diabetes, unexplained weight loss, unexplained changes in bowel or bladder problems, unexplained stumbling or dropping things, and spinal surgery, known osteoporosis.   PRECAUTIONS: from OT: don't grab small things.   SUBJECTIVE: Patient reports on Saturday she went into the grocery store to shop instead of ordering her groceries like she usually does. She got several cases of water and a huge thing of toilet paper. Her hips started hurting a lot and she felt wiped out. She finally felt more normal by Tuesday morning. She thought her iron might be low but her hemoglobin was fine and ferritin was not checked. She states her providers said they did not know what to tell her about her iron. She is also tired from it being test week at school. She felt okay after last PT session. She tried to be more active after the hip pain got better.   PAIN:  Are you having pain? NPRS 0/10  OBJECTIVE  TODAY'S TREATMENT Therapeutic exercise: to centralize symptoms and improve ROM, strength, muscular endurance, and activity tolerance required for successful completion of functional activities.  - NuStep level 1 using bilateral upper and lower extremities. Seat/handle setting 10/10. For improved extremity mobility, muscular endurance, and activity tolerance; and to induce the analgesic effect of aerobic exercise, stimulate improved joint nutrition, and prepare body structures and systems for following interventions. x 5  minutes. Average SPM = 91. -  STS from chair 17 inch chair: 3x10.  - Heel raises with heels over 2/4 board: 2x15. - standing B gastroc stretch on 20 degree slant board with B UE support available. 3x30 seconds.   Superset:  -  lateral waking 3x21 feet each direction with RTB around ankles.  - forwards/backwards monster walks with RTB around ankles, SBA for safety, 3x21 feet each direction.   - Standing scapular rows with 20# cable: 3x8/10 at 20/20/15#  Pt required multimodal cuing for proper technique and to facilitate improved neuromuscular control, strength, range of motion, and functional ability resulting in improved performance and form.    PATIENT EDUCATION:  Exercise purpose/form. Self management techniques. Person educated: Patient Education method: Explanation, demonstration, tactile and verbal cuing Education comprehension: verbalized and demonstrated understanding and needs further education     HOME EXERCISE PROGRAM: Access Code: [email or web address] URL: [email or web address] Date: 11/09/2021 Prepared by: Rosita Kea  Exercises - Sit to Stand Without Arm Support  - 3-4 x weekly - 2 sets - 10 reps - Side Stepping with Resistance at Ankles  - 3-4 x weekly - 2 sets - 20 feet - Heel Raises with Counter Support  - 3-4 x weekly - 2 sets - 10 reps   ASSESSMENT:   CLINICAL IMPRESSION: Patient arrives feeling well with no knee pain or hip pain upon arrival. She missed her last PT session due to feeling exhausted after some shopping over the weekend. Patient tolerated treatment well with sufficient rest. She reported no increase in pain during session.  Continued with generalized strengthening with progressions as tolerated. Patient continues to require PT input for exercise selection, form, and volume. Plan to continue with similar focus as tolerated next session. Patient would benefit from continued management of limiting condition by skilled physical therapist to address remaining impairments and functional limitations to work towards stated goals and return to PLOF or maximal functional independence.   From Initial PT eval 11/07/2021:  Patient is a 61 y.o. female referred to outpatient physical  therapy with a medical diagnosis of endometrial cancer, antineoplastic chemotherapy, deconditioning who presents with signs and symptoms consistent with generalized deconditioning weakness following cancer diagnosis and treatment. Patient also appears to have R > L sciatic nerve tension and history of hamstring strain that is likely affecting her strength and stability R LE more than left. Patient has significant weakness of bilateral hip abductors that negatively impact her ability to perform activities that require single leg stance such as floor transfers, walking, playing with her grandchildren, etc.  Patient presents with significant pain, muscle tension, muscle performance (strength/power/endurance), balance, and activity tolerance impairments that are limiting ability to complete her usual activities that require strength and endurance including walking, standing, lifting, playing with grandchildren, floor transfers without difficulty. Patient will benefit from skilled physical therapy intervention to address current body structure impairments and activity limitations to improve function and work towards goals set in current POC in order to return to prior level of function or maximal functional improvement.      OBJECTIVE IMPAIRMENTS cardiopulmonary status limiting activity, decreased activity tolerance, decreased balance, decreased endurance, decreased mobility, difficulty walking, decreased strength, impaired perceived functional ability, impaired flexibility, obesity, and pain.    ACTIVITY LIMITATIONS carrying, lifting, standing, squatting, and stairs   PARTICIPATION LIMITATIONS: interpersonal relationship, community activity, and   activities that require strength and endurance including walking, standing, playing with grandchildren, floor transfers, lifting.    PERSONAL FACTORS Past/current experiences, Time since onset of injury/illness/exacerbation, and 3+ comorbidities:  are also affecting  patient's functional outcome.    REHAB POTENTIAL: Good   CLINICAL DECISION MAKING: Stable/uncomplicated   EVALUATION COMPLEXITY: Low     GOALS: Goals reviewed with patient? No   SHORT TERM GOALS: Target date: 11/21/2021   Patient will be independent with initial home exercise program for self-management of symptoms. Baseline: Initial HEP to be provided at visit 2 as appropriate (11/07/21); initial HEP provided at visit 2 (11/09/2021);  Goal status: MET     LONG TERM GOALS: Target date: 01/30/2022. Updated to 03/14/2022 on 12/20/2021 for all unmet goals.    Patient will be independent with a long-term home exercise program for self-management of symptoms.  Baseline: Initial HEP to be provided at visit 2 as appropriate (11/07/21); initial HEP provided at visit 2 (11/09/2021); participating well (12/20/2021);  Goal status: In-progress   2.  Patient will demonstrate improved FOTO by equal or greater than 10 points to demonstrate improvement in overall condition and self-reported functional ability.  Baseline: to be tested at visit 2 as appropriate (11/07/21); 62 at visit #2 (11/09/2021); 60 at visit #10 (12/20/2021);  Goal status: On-going   3.  Patient will demonstrate ability to perform single leg stance for equal or greater than 30 seconds with no trendelenburg on each side to improve her ability to complete activities that require single leg stance such as walking and floor transfers.  Baseline: R= 7 seconds, legs touching, trendelenburg present, L= 6 seconds, legs touching, trendelenburg present (11/07/21); R= 5 seconds, legs touching, less trendelenburg,  L= 12 seconds, legs touching, trendelenburg present (12/20/2021);  Goal status: In-progress   4.  Patient will demonstrate ability to ambulate equal or greater than aged matched norm of 1765 feet during 6 Minute Walk Test to improve her community mobility.  Baseline: to be tested at visit 2 as appropriate (11/07/21); 1462 feet no AD, SpO2  97%, HR 127 BPM (11/09/2021); 1640 feet no AD, SpO2 94%, HR 144 BPM (12/20/2021);  Goal status: In-progress   5.  Patient will complete community, work and/or recreational activities without limitation due to current condition.  Baseline: difficulty with activities that require strength and endurance including walking, standing, playing with grandchildren, floor transfers, lifting (11/07/21); notes improvements but continues to have limitations (12/20/2021);  Goal status: In-progress   6.  Patient will complete Floor Transfer Test in equal or less than 8.8 seconds (cut off score for healthy elderly adult vs those with stroke) to demonstrate improved ability to get up and down from the floor to play with grandchildren.  Baseline: to be tested visit 2 as appropriate (11/07/2021); 8.94 seconds without use of chair (11/09/2021);  7.04 seconds without use of chair (12/20/2021);  Goal status: MET   7.  Patient will complete floor to waist lift with 25# box 10 repetitions in a row to demonstrate improved ability to perform lifting at home.   Baseline: to be tested visit 2 as appropriate (11/07/2021);  up to 19.5# 1 rep. (L wrist and dyspnea limiting (11/09/2021);  DISCONTINUED due to limitations from wrist condition (12/20/2021);  Goal status: DISCONTINUED 12/20/2021   PLAN: PT FREQUENCY: 1-2x/week   PT DURATION: 12 weeks   PLANNED INTERVENTIONS: Therapeutic exercises, Therapeutic activity, Neuromuscular re-education, Balance training, Gait training, Patient/Family education, Self Care, Joint mobilization, Stair training, Cryotherapy, Moist heat, Manual therapy, and Re-evaluation.   PLAN FOR NEXT SESSION: update HEP as appropriate, functional, LE, and UE strengthening and balance exercises.    Everlean Alstrom. Graylon Good, PT, DPT 01/25/22, 4:41 PM  [facility] [address] [place], [place] [address] P: [phone] I F: [phone]

## 2022-01-30 ENCOUNTER — Ambulatory Visit: Payer: BC Managed Care – PPO | Admitting: Physical Therapy

## 2022-01-30 ENCOUNTER — Encounter: Payer: Self-pay | Admitting: Physical Therapy

## 2022-01-30 DIAGNOSIS — R262 Difficulty in walking, not elsewhere classified: Secondary | ICD-10-CM

## 2022-01-30 DIAGNOSIS — M6281 Muscle weakness (generalized): Secondary | ICD-10-CM | POA: Diagnosis not present

## 2022-01-30 NOTE — Therapy (Signed)
OUTPATIENT PHYSICAL THERAPY TREATMENT NOTE   [name] MRN: [id number] DOB:04-07-1960, 61 y.o., female Today's Date: 01/30/22  PCP: Rusty Aus, MD REFERRING PROVIDER: Tresa Garter, PA  END OF SESSION:   PT End of Session - 01/30/22 1122     Visit Number 14    Number of Visits 24    Date for PT Re-Evaluation 03/14/22    Authorization Type [employer] reporting period from 12/20/2021    Progress Note Due on Visit 20    PT Start Time 1117    PT Stop Time 1155    PT Time Calculation (min) 38 min    Activity Tolerance Patient tolerated treatment well    Behavior During Therapy WFL for tasks assessed/performed                 Past Medical History:  Diagnosis Date   B12 deficiency    Endometrial cancer ([place])    GERD (gastroesophageal reflux disease)    History reviewed. No pertinent surgical history. 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 female who presents to outpatient physical therapy with a referral for medical diagnosis endometrial cancer, antineoplastic chemotherapy, deconditioning. This patient's chief complaints consist of deconditioning s/p cancer dx and treatment leading to the following functional deficits: difficulty with activities that require strength and endurance including walking, standing, playing with grandchildren,  lifting, floor transfers. Relevant past medical history and comorbidities include stroke (affected left arm temporarily), GERD, left hand weakness, dyslipidemia, B12 deficiency, endometrial cancer (stage IV), anemia.  Patient denies hx of seizures, lung problems, heart problems, diabetes, unexplained weight loss, unexplained changes in bowel or bladder problems, unexplained stumbling or dropping things, and spinal surgery, known osteoporosis.   PRECAUTIONS: from OT: don't grab small things.   SUBJECTIVE: Patient reports she feels good today with no pain currently. 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   PAIN:  Are you having pain? NPRS 0/10  OBJECTIVE  TODAY'S TREATMENT Therapeutic exercise: to centralize symptoms and improve ROM, strength, muscular endurance, and activity tolerance required for successful completion of functional activities.  - NuStep level 3 using bilateral upper and lower extremities. Seat/handle setting 10/10. For improved extremity mobility, muscular endurance, and activity tolerance; and to induce the analgesic effect of aerobic exercise, stimulate improved joint nutrition, and prepare body structures and systems for following interventions. x 5  minutes. Average SPM = 99. - STS from chair 17 inch chair: 3x10.  - Heel raises with heels over 2x4 board: 2x20.  Superset:  - lateral waking 3x21 feet each direction with GTB around ankles.  - forwards/backwards monster walks with GTB around ankles, SBA for safety, 3x21 feet each direction.   - Standing scapular rows with 20# cable: 3x10/10/7 at 20# - seated leg press (seat position 5), 3x10 at 35/35/45# - standing RDL with 10# KB touching 4 inch yoga block between feet. 2x10  Pt required multimodal cuing for proper technique and to  facilitate improved neuromuscular control, strength, range of motion, and functional ability resulting in improved performance and form.    PATIENT  EDUCATION:  Exercise purpose/form. Self management techniques. Person educated: Patient Education method: Explanation, demonstration, tactile and verbal cuing Education comprehension: verbalized and demonstrated understanding and needs further education     HOME EXERCISE PROGRAM: Access Code: [email or web address] URL: [email or web address] Date: 11/09/2021 Prepared by: Rosita Kea  Exercises - Sit to Stand Without Arm Support  - 3-4 x weekly - 2 sets - 10 reps - Side Stepping with Resistance at Ankles  - 3-4 x weekly - 2 sets - 20 feet - Heel Raises with Counter Support  - 3-4 x weekly - 2 sets - 10 reps   ASSESSMENT:   CLINICAL IMPRESSION: Patient arrives feeling well with no pain. She continued to focus on total body strengthening with special attention to lateral hips to help improve activity tolerance and lateral hip pain. Patient was able to tolerate more volume than usual and exercises were progressed as tolerated. Patient was quite fatigued by end of session.  Plan to continue with progressive exercises next session as appropriate. Patient would benefit from continued management of limiting condition by skilled physical therapist to address remaining impairments and functional limitations to work towards stated goals and return to PLOF or maximal functional independence.   From Initial PT eval 11/07/2021:  Patient is a 61 y.o. female referred to outpatient physical therapy with a medical diagnosis of endometrial cancer, antineoplastic chemotherapy, deconditioning who presents with signs and symptoms consistent with generalized deconditioning weakness following cancer diagnosis and treatment. Patient also appears to have R > L sciatic nerve tension and history of hamstring strain that is likely affecting her strength and stability R LE more than left. Patient has significant weakness of bilateral hip abductors that negatively impact her ability to perform activities that require single  leg stance such as floor transfers, walking, playing with her grandchildren, etc.  Patient presents with significant pain, muscle tension, muscle performance (strength/power/endurance), balance, and activity tolerance impairments that are limiting ability to complete her usual activities that require strength and endurance including walking, standing, lifting, playing with grandchildren, floor transfers without difficulty. Patient will benefit from skilled physical therapy intervention to address current body structure impairments and activity limitations to improve function and work towards goals set in current POC in order to return to prior level of function or maximal functional improvement.      OBJECTIVE IMPAIRMENTS cardiopulmonary status limiting activity, decreased activity tolerance, decreased balance, decreased endurance, decreased mobility, difficulty walking, decreased strength, impaired perceived functional ability, impaired flexibility, obesity, and pain.    ACTIVITY LIMITATIONS carrying, lifting, standing, squatting, and stairs   PARTICIPATION LIMITATIONS: interpersonal relationship, community activity, and   activities that require strength and endurance including walking, standing, playing with grandchildren, floor transfers, lifting.    PERSONAL FACTORS Past/current experiences, Time since onset of injury/illness/exacerbation, and 3+ comorbidities:    are also affecting patient's functional outcome.    REHAB POTENTIAL: Good   CLINICAL DECISION MAKING: Stable/uncomplicated   EVALUATION COMPLEXITY: Low     GOALS: Goals reviewed with patient? No   SHORT TERM GOALS: Target date: 11/21/2021   Patient will be independent with initial home exercise program for self-management of symptoms. Baseline: Initial HEP to be provided at visit 2 as appropriate (11/07/21); initial HEP provided at visit 2 (11/09/2021);  Goal status: MET     LONG TERM GOALS: Target date: 01/30/2022. Updated  to 03/14/2022 on 12/20/2021 for all  unmet goals.    Patient will be independent with a long-term home exercise program for self-management of symptoms.  Baseline: Initial HEP to be provided at visit 2 as appropriate (11/07/21); initial HEP provided at visit 2 (11/09/2021); participating well (12/20/2021);  Goal status: In-progress   2.  Patient will demonstrate improved FOTO by equal or greater than 10 points to demonstrate improvement in overall condition and self-reported functional ability.  Baseline: to be tested at visit 2 as appropriate (11/07/21); 62 at visit #2 (11/09/2021); 60 at visit #10 (12/20/2021);  Goal status: On-going   3.  Patient will demonstrate ability to perform single leg stance for equal or greater than 30 seconds with no trendelenburg on each side to improve her ability to complete activities that require single leg stance such as walking and floor transfers.  Baseline: R= 7 seconds, legs touching, trendelenburg present, L= 6 seconds, legs touching, trendelenburg present (11/07/21); R= 5 seconds, legs touching, less trendelenburg,  L= 12 seconds, legs touching, trendelenburg present (12/20/2021);  Goal status: In-progress   4.  Patient will demonstrate ability to ambulate equal or greater than aged matched norm of 1765 feet during 6 Minute Walk Test to improve her community mobility.  Baseline: to be tested at visit 2 as appropriate (11/07/21); 1462 feet no AD, SpO2 97%, HR 127 BPM (11/09/2021); 1640 feet no AD, SpO2 94%, HR 144 BPM (12/20/2021);  Goal status: In-progress   5.  Patient will complete community, work and/or recreational activities without limitation due to current condition.  Baseline: difficulty with activities that require strength and endurance including walking, standing, playing with grandchildren, floor transfers, lifting (11/07/21); notes improvements but continues to have limitations (12/20/2021);  Goal status: In-progress   6.  Patient will complete Floor  Transfer Test in equal or less than 8.8 seconds (cut off score for healthy elderly adult vs those with stroke) to demonstrate improved ability to get up and down from the floor to play with grandchildren.  Baseline: to be tested visit 2 as appropriate (11/07/2021); 8.94 seconds without use of chair (11/09/2021);  7.04 seconds without use of chair (12/20/2021);  Goal status: MET   7.  Patient will complete floor to waist lift with 25# box 10 repetitions in a row to demonstrate improved ability to perform lifting at home.   Baseline: to be tested visit 2 as appropriate (11/07/2021);  up to 19.5# 1 rep. (L wrist and dyspnea limiting (11/09/2021);  DISCONTINUED due to limitations from wrist condition (12/20/2021);  Goal status: DISCONTINUED 12/20/2021   PLAN: PT FREQUENCY: 1-2x/week   PT DURATION: 12 weeks   PLANNED INTERVENTIONS: Therapeutic exercises, Therapeutic activity, Neuromuscular re-education, Balance training, Gait training, Patient/Family education, Self Care, Joint mobilization, Stair training, Cryotherapy, Moist heat, Manual therapy, and Re-evaluation.   PLAN FOR NEXT SESSION: update HEP as appropriate, functional, LE, and UE strengthening and balance exercises.    Everlean Alstrom. Graylon Good, PT, DPT 01/30/22, 11:56 AM  [facility] [address] [place], [place] [address] P: [phone] I F: [phone]

## 2022-02-01 ENCOUNTER — Encounter: Payer: Self-pay | Admitting: Physical Therapy

## 2022-02-01 ENCOUNTER — Ambulatory Visit: Payer: BC Managed Care – PPO | Admitting: Physical Therapy

## 2022-02-01 DIAGNOSIS — M6281 Muscle weakness (generalized): Secondary | ICD-10-CM

## 2022-02-01 DIAGNOSIS — R262 Difficulty in walking, not elsewhere classified: Secondary | ICD-10-CM

## 2022-02-01 NOTE — Therapy (Signed)
OUTPATIENT PHYSICAL THERAPY TREATMENT NOTE   [name] MRN: [id number] DOB:1960/04/01, 61 y.o., female Today's Date: 02/01/22  PCP: Rusty Aus, MD REFERRING PROVIDER: Tresa Garter, PA  END OF SESSION:   PT End of Session - 02/01/22 1123     Visit Number 15    Number of Visits 24    Date for PT Re-Evaluation 03/14/22    Authorization Type [employer] reporting period from 12/20/2021    Progress Note Due on Visit 20    PT Start Time 1121    PT Stop Time 1200    PT Time Calculation (min) 39 min    Activity Tolerance Patient tolerated treatment well    Behavior During Therapy WFL for tasks assessed/performed                  Past Medical History:  Diagnosis Date   B12 deficiency    Endometrial cancer ([place])    GERD (gastroesophageal reflux disease)    History reviewed. No pertinent surgical history. 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 female who presents to outpatient physical therapy with a referral for medical diagnosis endometrial cancer, antineoplastic chemotherapy, deconditioning. This patient's chief complaints consist of deconditioning s/p cancer dx and treatment leading to the following functional deficits: difficulty with activities that require strength and endurance including walking, standing, playing with grandchildren,  lifting, floor transfers. Relevant past medical history and comorbidities include stroke (affected left arm temporarily), GERD, left hand weakness, dyslipidemia, B12 deficiency, endometrial cancer (stage IV), anemia.  Patient denies hx of seizures, lung problems, heart problems, diabetes, unexplained weight loss, unexplained changes in bowel or bladder problems, unexplained stumbling or dropping things, and spinal surgery, known osteoporosis.   PRECAUTIONS: from OT: don't grab small things.   SUBJECTIVE: Patient reports she feels good today but she had R hip pain 4/10 yesterday after doing a lot of chores and grocery shopping. She already went to the grocery store today and she is not having pain right now.    PAIN:  Are you having pain? NPRS 0/10  OBJECTIVE  TODAY'S TREATMENT Therapeutic exercise: to centralize symptoms and improve ROM, strength, muscular endurance, and activity tolerance required for successful completion of functional activities.  - NuStep level 4 using bilateral upper and lower extremities. Seat/handle setting 10/10. For improved extremity mobility, muscular endurance, and activity tolerance; and to induce the analgesic effect of aerobic exercise, stimulate improved joint nutrition, and prepare body structures and systems for following interventions. x 5  minutes. Average SPM = 95. - STS from chair 17 inch chair: 3x10.  - Heel raises with heels over 2x4 board: 2x20. - lateral waking 3x21 feet each direction with GTB around ankles.  - forwards/backwards monster walks with GTB around ankles, SBA for safety, 3x21 feet each direction.  - standing hip hike off edge of 2x4 board with ipsilateral UE support, 1x10 each side.  - Standing scapular rows with 20# cable: 3x10 at 20# - seated  leg press (seat position 6,5), 3x10 at 45/55/55# - standing RDL with 20# KB touching 4 inch yoga block between feet. 2x10  Pt required multimodal cuing for proper technique and to facilitate  improved neuromuscular control, strength, range of motion, and functional ability resulting in improved performance and form.    PATIENT EDUCATION:  Exercise purpose/form. Self management techniques. Person educated: Patient Education method: Explanation, demonstration, tactile and verbal cuing Education comprehension: verbalized and demonstrated understanding and needs further education     HOME EXERCISE PROGRAM: Access Code: [email or web address] URL: [email or web address] Date: 11/09/2021 Prepared by: Rosita Kea  Exercises - Sit to Stand Without Arm Support  - 3-4 x weekly - 2 sets - 10 reps - Side Stepping with Resistance at Ankles  - 3-4 x weekly - 2 sets - 20 feet - Heel Raises with Counter Support  - 3-4 x weekly - 2 sets - 10 reps   ASSESSMENT:   CLINICAL IMPRESSION: Patient arrives feeling well with no pain but did have some hip pain yesterday after grocery shopping and being pretty active. Continued with whole body strengthening today with particular focus on lateral hips and functional movements. Patient required several rest breaks but was able to increase load slightly in several exercises. Plan to continue with progressive strengthening next session as appropriate. Patient would benefit from continued management of limiting condition by skilled physical therapist to address remaining impairments and functional limitations to work towards stated goals and return to PLOF or maximal functional independence.    From Initial PT eval 11/07/2021:  Patient is a 61 y.o. female referred to outpatient physical therapy with a medical diagnosis of endometrial cancer, antineoplastic chemotherapy, deconditioning who presents with signs and symptoms consistent with generalized deconditioning weakness following cancer diagnosis and treatment. Patient also appears to have R > L sciatic nerve tension and history of hamstring strain that is likely affecting her strength and stability R LE more  than left. Patient has significant weakness of bilateral hip abductors that negatively impact her ability to perform activities that require single leg stance such as floor transfers, walking, playing with her grandchildren, etc.  Patient presents with significant pain, muscle tension, muscle performance (strength/power/endurance), balance, and activity tolerance impairments that are limiting ability to complete her usual activities that require strength and endurance including walking, standing, lifting, playing with grandchildren, floor transfers without difficulty. Patient will benefit from skilled physical therapy intervention to address current body structure impairments and activity limitations to improve function and work towards goals set in current POC in order to return to prior level of function or maximal functional improvement.      OBJECTIVE IMPAIRMENTS cardiopulmonary status limiting activity, decreased activity tolerance, decreased balance, decreased endurance, decreased mobility, difficulty walking, decreased strength, impaired perceived functional ability, impaired flexibility, obesity, and pain.    ACTIVITY LIMITATIONS carrying, lifting, standing, squatting, and stairs   PARTICIPATION LIMITATIONS: interpersonal relationship, community activity, and   activities that require strength and endurance including walking, standing, playing with grandchildren, floor transfers, lifting.    PERSONAL FACTORS Past/current experiences, Time since onset of injury/illness/exacerbation, and 3+ comorbidities:    are also affecting patient's functional outcome.    REHAB POTENTIAL: Good   CLINICAL DECISION MAKING: Stable/uncomplicated   EVALUATION COMPLEXITY: Low     GOALS: Goals reviewed with patient? No   SHORT TERM GOALS: Target date: 11/21/2021   Patient will be independent with initial home exercise program for self-management of symptoms. Baseline: Initial HEP to be provided at visit 2  as  appropriate (11/07/21); initial HEP provided at visit 2 (11/09/2021);  Goal status: MET     LONG TERM GOALS: Target date: 01/30/2022. Updated to 03/14/2022 on 12/20/2021 for all unmet goals.    Patient will be independent with a long-term home exercise program for self-management of symptoms.  Baseline: Initial HEP to be provided at visit 2 as appropriate (11/07/21); initial HEP provided at visit 2 (11/09/2021); participating well (12/20/2021);  Goal status: In-progress   2.  Patient will demonstrate improved FOTO by equal or greater than 10 points to demonstrate improvement in overall condition and self-reported functional ability.  Baseline: to be tested at visit 2 as appropriate (11/07/21); 62 at visit #2 (11/09/2021); 60 at visit #10 (12/20/2021);  Goal status: On-going   3.  Patient will demonstrate ability to perform single leg stance for equal or greater than 30 seconds with no trendelenburg on each side to improve her ability to complete activities that require single leg stance such as walking and floor transfers.  Baseline: R= 7 seconds, legs touching, trendelenburg present, L= 6 seconds, legs touching, trendelenburg present (11/07/21); R= 5 seconds, legs touching, less trendelenburg,  L= 12 seconds, legs touching, trendelenburg present (12/20/2021);  Goal status: In-progress   4.  Patient will demonstrate ability to ambulate equal or greater than aged matched norm of 1765 feet during 6 Minute Walk Test to improve her community mobility.  Baseline: to be tested at visit 2 as appropriate (11/07/21); 1462 feet no AD, SpO2 97%, HR 127 BPM (11/09/2021); 1640 feet no AD, SpO2 94%, HR 144 BPM (12/20/2021);  Goal status: In-progress   5.  Patient will complete community, work and/or recreational activities without limitation due to current condition.  Baseline: difficulty with activities that require strength and endurance including walking, standing, playing with grandchildren, floor transfers,  lifting (11/07/21); notes improvements but continues to have limitations (12/20/2021);  Goal status: In-progress   6.  Patient will complete Floor Transfer Test in equal or less than 8.8 seconds (cut off score for healthy elderly adult vs those with stroke) to demonstrate improved ability to get up and down from the floor to play with grandchildren.  Baseline: to be tested visit 2 as appropriate (11/07/2021); 8.94 seconds without use of chair (11/09/2021);  7.04 seconds without use of chair (12/20/2021);  Goal status: MET   7.  Patient will complete floor to waist lift with 25# box 10 repetitions in a row to demonstrate improved ability to perform lifting at home.   Baseline: to be tested visit 2 as appropriate (11/07/2021);  up to 19.5# 1 rep. (L wrist and dyspnea limiting (11/09/2021);  DISCONTINUED due to limitations from wrist condition (12/20/2021);  Goal status: DISCONTINUED 12/20/2021   PLAN: PT FREQUENCY: 1-2x/week   PT DURATION: 12 weeks   PLANNED INTERVENTIONS: Therapeutic exercises, Therapeutic activity, Neuromuscular re-education, Balance training, Gait training, Patient/Family education, Self Care, Joint mobilization, Stair training, Cryotherapy, Moist heat, Manual therapy, and Re-evaluation.   PLAN FOR NEXT SESSION: update HEP as appropriate, functional, LE, and UE strengthening and balance exercises.    Everlean Alstrom. Graylon Good, PT, DPT 02/01/22, 12:03 PM  [facility] [address] [place], [place] [address] P: [phone] I F: [phone]

## 2022-02-15 ENCOUNTER — Ambulatory Visit: Payer: BC Managed Care – PPO | Attending: Physician Assistant | Admitting: Physical Therapy

## 2022-02-15 ENCOUNTER — Encounter: Payer: Self-pay | Admitting: Physical Therapy

## 2022-02-15 DIAGNOSIS — R262 Difficulty in walking, not elsewhere classified: Secondary | ICD-10-CM | POA: Diagnosis present

## 2022-02-15 DIAGNOSIS — M25532 Pain in left wrist: Secondary | ICD-10-CM | POA: Insufficient documentation

## 2022-02-15 DIAGNOSIS — M6281 Muscle weakness (generalized): Secondary | ICD-10-CM | POA: Insufficient documentation

## 2022-02-15 DIAGNOSIS — M79641 Pain in right hand: Secondary | ICD-10-CM | POA: Diagnosis present

## 2022-02-15 NOTE — Therapy (Signed)
OUTPATIENT PHYSICAL THERAPY TREATMENT NOTE   [name] MRN: [id number] DOB:May 13, 1960, 62 y.o., female Today's Date: 02/15/22  PCP: Rusty Aus, MD REFERRING PROVIDER: Tresa Garter, PA  END OF SESSION:   PT End of Session - 02/15/22 1526     Visit Number 16    Number of Visits 24    Date for PT Re-Evaluation 03/14/22    Authorization Type [employer] reporting period from 12/20/2021    Progress Note Due on Visit 20    PT Start Time 1525    PT Stop Time 1600    PT Time Calculation (min) 35 min    Activity Tolerance Patient tolerated treatment well    Behavior During Therapy WFL for tasks assessed/performed              Past Medical History:  Diagnosis Date   B12 deficiency    Endometrial cancer ([place])    GERD (gastroesophageal reflux disease)    History reviewed. No pertinent surgical history. 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 female who presents to outpatient physical therapy with a referral for medical diagnosis endometrial cancer, antineoplastic chemotherapy, deconditioning. This patient's chief complaints consist of deconditioning s/p cancer dx and treatment leading to the following functional deficits: difficulty with activities that require strength and endurance including walking, standing, playing with grandchildren, lifting,  floor transfers. Relevant past medical history and comorbidities include stroke (affected left arm temporarily), GERD, left hand weakness, dyslipidemia, B12 deficiency, endometrial cancer (stage IV), anemia.  Patient denies hx of seizures, lung problems, heart problems, diabetes, unexplained weight loss, unexplained changes in bowel or bladder problems, unexplained stumbling or dropping things, and spinal surgery, known osteoporosis.   PRECAUTIONS: from OT: don't grab small things.   SUBJECTIVE: Patient reports she feels pretty good today except she started getting some intermittent pain in her right triceps after lunch today. She thought maybe this was from lifting some water from her husband's car today. She had a fairly restful holiday. She did a lot of physical activity over the holiday break. She does not remember how she felt after last PT session. She states her best friend from college passed away on April 17, 2022. She cannot go to the funeral because of the risk to patient's health from respiratory illness.   PAIN:  Are you having pain? NPRS 0/10  OBJECTIVE  TODAY'S TREATMENT Therapeutic exercise: to centralize symptoms and improve ROM, strength, muscular endurance, and activity tolerance required for successful completion of functional activities.  - NuStep level 5 using bilateral upper and lower extremities. Seat/handle setting 10/10. For improved extremity mobility, muscular endurance, and activity tolerance; and to induce the analgesic effect of aerobic exercise, stimulate improved joint nutrition, and prepare body structures and systems for following interventions. x 5  minutes. Average SPM = 100. - STS from chair 17 inch chair: 3x10.  - Heel raises with heels over 2x4 board: 2x20. - lateral waking 2x30 feet each direction with GTB around ankles.  -  forwards/backwards monster walks with GTB around ankles, SBA for safety, 2x30 feet each direction.  - standing hip hike off edge of 2x4 board  with ipsilateral UE support, 1x10 each side.  - Standing scapular rows with 20# cable: 3x10 at 20#  Pt required multimodal cuing for proper technique and to facilitate improved neuromuscular control, strength, range of motion, and functional ability resulting in improved performance and form.    PATIENT EDUCATION:  Exercise purpose/form. Self management techniques. Person educated: Patient Education method: Explanation, demonstration, tactile and verbal cuing Education comprehension: verbalized and demonstrated understanding and needs further education     HOME EXERCISE PROGRAM: Access Code: [email or web address] URL: [email or web address] Date: 11/09/2021 Prepared by: Rosita Kea  Exercises - Sit to Stand Without Arm Support  - 3-4 x weekly - 2 sets - 10 reps - Side Stepping with Resistance at Ankles  - 3-4 x weekly - 2 sets - 20 feet - Heel Raises with Counter Support  - 3-4 x weekly - 2 sets - 10 reps   ASSESSMENT:   CLINICAL IMPRESSION: Patient arrives feeling grief after her best friend from college passed away from cancer on 04-23-2022 and she is unable to go to the funeral. She continued iwht similar total body strengthening exercises but had to take more breaks due to quick fatigue that was exacerbated by wearing a mask in the clinic today. She continues to require PT guidance for exercise selection, volume, rest breaks, and forms. Patient would benefit from continued management of limiting condition by skilled physical therapist to address remaining impairments and functional limitations to work towards stated goals and return to PLOF or maximal functional independence.   From Initial PT eval 11/07/2021:  Patient is a 62 y.o. female referred to outpatient physical therapy with a medical diagnosis of endometrial cancer, antineoplastic chemotherapy, deconditioning who presents with signs and symptoms consistent with generalized deconditioning weakness following cancer diagnosis and  treatment. Patient also appears to have R > L sciatic nerve tension and history of hamstring strain that is likely affecting her strength and stability R LE more than left. Patient has significant weakness of bilateral hip abductors that negatively impact her ability to perform activities that require single leg stance such as floor transfers, walking, playing with her grandchildren, etc.  Patient presents with significant pain, muscle tension, muscle performance (strength/power/endurance), balance, and activity tolerance impairments that are limiting ability to complete her usual activities that require strength and endurance including walking, standing, lifting, playing with grandchildren, floor transfers without difficulty. Patient will benefit from skilled physical therapy intervention to address current body structure impairments and activity limitations to improve function and work towards goals set in current POC in order to return to prior level of function or maximal functional improvement.      OBJECTIVE IMPAIRMENTS cardiopulmonary status limiting activity, decreased activity tolerance, decreased balance, decreased endurance, decreased mobility, difficulty walking, decreased strength, impaired perceived functional ability, impaired flexibility, obesity, and pain.    ACTIVITY LIMITATIONS carrying, lifting, standing, squatting, and stairs   PARTICIPATION LIMITATIONS: interpersonal relationship, community activity, and   activities that require strength and endurance including walking, standing, playing with grandchildren, floor transfers, lifting.    PERSONAL FACTORS Past/current experiences, Time since onset of injury/illness/exacerbation, and 3+ comorbidities:    are also affecting patient's functional outcome.    REHAB POTENTIAL: Good   CLINICAL DECISION MAKING: Stable/uncomplicated   EVALUATION COMPLEXITY: Low     GOALS: Goals reviewed with patient? No   SHORT TERM GOALS: Target  date:  11/21/2021   Patient will be independent with initial home exercise program for self-management of symptoms. Baseline: Initial HEP to be provided at visit 2 as appropriate (11/07/21); initial HEP provided at visit 2 (11/09/2021);  Goal status: MET     LONG TERM GOALS: Target date: 01/30/2022. Updated to 03/14/2022 on 12/20/2021 for all unmet goals.    Patient will be independent with a long-term home exercise program for self-management of symptoms.  Baseline: Initial HEP to be provided at visit 2 as appropriate (11/07/21); initial HEP provided at visit 2 (11/09/2021); participating well (12/20/2021);  Goal status: In-progress   2.  Patient will demonstrate improved FOTO by equal or greater than 10 points to demonstrate improvement in overall condition and self-reported functional ability.  Baseline: to be tested at visit 2 as appropriate (11/07/21); 62 at visit #2 (11/09/2021); 60 at visit #10 (12/20/2021);  Goal status: On-going   3.  Patient will demonstrate ability to perform single leg stance for equal or greater than 30 seconds with no trendelenburg on each side to improve her ability to complete activities that require single leg stance such as walking and floor transfers.  Baseline: R= 7 seconds, legs touching, trendelenburg present, L= 6 seconds, legs touching, trendelenburg present (11/07/21); R= 5 seconds, legs touching, less trendelenburg,  L= 12 seconds, legs touching, trendelenburg present (12/20/2021);  Goal status: In-progress   4.  Patient will demonstrate ability to ambulate equal or greater than aged matched norm of 1765 feet during 6 Minute Walk Test to improve her community mobility.  Baseline: to be tested at visit 2 as appropriate (11/07/21); 1462 feet no AD, SpO2 97%, HR 127 BPM (11/09/2021); 1640 feet no AD, SpO2 94%, HR 144 BPM (12/20/2021);  Goal status: In-progress   5.  Patient will complete community, work and/or recreational activities without limitation due to  current condition.  Baseline: difficulty with activities that require strength and endurance including walking, standing, playing with grandchildren, floor transfers, lifting (11/07/21); notes improvements but continues to have limitations (12/20/2021);  Goal status: In-progress   6.  Patient will complete Floor Transfer Test in equal or less than 8.8 seconds (cut off score for healthy elderly adult vs those with stroke) to demonstrate improved ability to get up and down from the floor to play with grandchildren.  Baseline: to be tested visit 2 as appropriate (11/07/2021); 8.94 seconds without use of chair (11/09/2021);  7.04 seconds without use of chair (12/20/2021);  Goal status: MET   7.  Patient will complete floor to waist lift with 25# box 10 repetitions in a row to demonstrate improved ability to perform lifting at home.   Baseline: to be tested visit 2 as appropriate (11/07/2021);  up to 19.5# 1 rep. (L wrist and dyspnea limiting (11/09/2021);  DISCONTINUED due to limitations from wrist condition (12/20/2021);  Goal status: DISCONTINUED 12/20/2021   PLAN: PT FREQUENCY: 1-2x/week   PT DURATION: 12 weeks   PLANNED INTERVENTIONS: Therapeutic exercises, Therapeutic activity, Neuromuscular re-education, Balance training, Gait training, Patient/Family education, Self Care, Joint mobilization, Stair training, Cryotherapy, Moist heat, Manual therapy, and Re-evaluation.   PLAN FOR NEXT SESSION: update HEP as appropriate, functional, LE, and UE strengthening and balance exercises.    Everlean Alstrom. Graylon Good, PT, DPT 02/15/22, 4:00 PM  [facility] [address] [place], [place] [address] P: [phone] I F: [phone]

## 2022-02-20 ENCOUNTER — Ambulatory Visit: Payer: BC Managed Care – PPO | Admitting: Physical Therapy

## 2022-02-20 ENCOUNTER — Encounter: Payer: Self-pay | Admitting: Physical Therapy

## 2022-02-20 DIAGNOSIS — M6281 Muscle weakness (generalized): Secondary | ICD-10-CM | POA: Diagnosis not present

## 2022-02-20 DIAGNOSIS — R262 Difficulty in walking, not elsewhere classified: Secondary | ICD-10-CM

## 2022-02-20 NOTE — Therapy (Signed)
OUTPATIENT PHYSICAL THERAPY TREATMENT NOTE   [name] MRN: [id number] DOB:1960/08/30, 62 y.o., female Today's Date: 02/20/22  PCP: Rusty Aus, MD REFERRING PROVIDER: Tresa Garter, PA  END OF SESSION:   PT End of Session - 02/20/22 1612     Visit Number 17    Number of Visits 24    Date for PT Re-Evaluation 03/14/22    Authorization Type [employer] reporting period from 12/20/2021    Progress Note Due on Visit 20    PT Start Time 1520    PT Stop Time 1600    PT Time Calculation (min) 40 min    Activity Tolerance Patient tolerated treatment well    Behavior During Therapy WFL for tasks assessed/performed               Past Medical History:  Diagnosis Date   B12 deficiency    Endometrial cancer ([place])    GERD (gastroesophageal reflux disease)    History reviewed. No pertinent surgical history. 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 female who presents to outpatient physical therapy with a referral for medical diagnosis endometrial cancer, antineoplastic chemotherapy, deconditioning. This patient's chief complaints consist of deconditioning s/p cancer dx and treatment leading to the following functional deficits: difficulty with activities that require strength and endurance including walking, standing, playing with grandchildren, lifting,  floor transfers. Relevant past medical history and comorbidities include stroke (affected left arm temporarily), GERD, left hand weakness, dyslipidemia, B12 deficiency, endometrial cancer (stage IV), anemia.  Patient denies hx of seizures, lung problems, heart problems, diabetes, unexplained weight loss, unexplained changes in bowel or bladder problems, unexplained stumbling or dropping things, and spinal surgery, known osteoporosis.   PRECAUTIONS: from OT: don't grab small things.   SUBJECTIVE: Patient reports she is stiff all over and her lower back is sore and she is not sure why. She states she is tired and has had some frustrations today at work. She did not do a lot on Saturday.   PAIN:  Are you having pain? NPRS 2-3/10 low back and has been a little stiff.   OBJECTIVE  TODAY'S TREATMENT Therapeutic exercise: to centralize symptoms and improve ROM, strength, muscular endurance, and activity tolerance required for successful completion of functional activities.  - NuStep level 6 using bilateral upper and lower extremities. Seat/handle setting 10/10. For improved extremity mobility, muscular endurance, and activity tolerance; and to induce the analgesic effect of aerobic exercise, stimulate improved joint nutrition, and prepare body structures and systems for following interventions. x 5  minutes. Average SPM = 91. - STS from chair 17 inch chair while holding 10# DB at his chest: 3x10.  - single leg eccentric heel raises with heels over 2x4 board: 2x10 each side.  - standing B gastroc stretch on 20 degree slant board, 2x30 seconds.  - lateral waking 2x21 feet each direction with GTB around ankles.  - forwards/backwards monster walks with GTB around ankles, SBA for safety, 2x21 feet  each direction.  - standing hip hike off edge of 2x4 board with ipsilateral UE support, 2x10 each side.  - Standing scapular rows with 20# cable: 3x10 at 25# (very hard and unable to complete full 3rd set continues  reps, limited by upper arm fatigue).   Pt required multimodal cuing for proper technique and to facilitate improved neuromuscular control, strength, range of motion, and functional ability resulting in improved performance and form.    PATIENT EDUCATION:  Exercise purpose/form. Self management techniques. Person educated: Patient Education method: Explanation, demonstration, tactile and verbal cuing Education comprehension: verbalized and demonstrated understanding and needs further education     HOME EXERCISE PROGRAM: Access Code: [email or web address] URL: [email or web address] Date: 11/09/2021 Prepared by: Rosita Kea  Exercises - Sit to Stand Without Arm Support  - 3-4 x weekly - 2 sets - 10 reps - Side Stepping with Resistance at Ankles  - 3-4 x weekly - 2 sets - 20 feet - Heel Raises with Counter Support  - 3-4 x weekly - 2 sets - 10 reps   ASSESSMENT:   CLINICAL IMPRESSION: Patient arrives with some mild low back pain and widespread stiffness. Continued with similar exercises progressed as appropriate. Patient tolerated well with strong sensation of fatigue with increases in exercise load. Plan to continue with progressive strengthening as tolerated next session. Patient would benefit from continued management of limiting condition by skilled physical therapist to address remaining impairments and functional limitations to work towards stated goals and return to PLOF or maximal functional independence.   From Initial PT eval 11/07/2021:  Patient is a 62 y.o. female referred to outpatient physical therapy with a medical diagnosis of endometrial cancer, antineoplastic chemotherapy, deconditioning who presents with signs and symptoms consistent with generalized deconditioning weakness following cancer diagnosis and treatment. Patient also appears to have R > L sciatic nerve tension and history of hamstring strain that is likely affecting her strength and stability R LE more than  left. Patient has significant weakness of bilateral hip abductors that negatively impact her ability to perform activities that require single leg stance such as floor transfers, walking, playing with her grandchildren, etc.  Patient presents with significant pain, muscle tension, muscle performance (strength/power/endurance), balance, and activity tolerance impairments that are limiting ability to complete her usual activities that require strength and endurance including walking, standing, lifting, playing with grandchildren, floor transfers without difficulty. Patient will benefit from skilled physical therapy intervention to address current body structure impairments and activity limitations to improve function and work towards goals set in current POC in order to return to prior level of function or maximal functional improvement.      OBJECTIVE IMPAIRMENTS cardiopulmonary status limiting activity, decreased activity tolerance, decreased balance, decreased endurance, decreased mobility, difficulty walking, decreased strength, impaired perceived functional ability, impaired flexibility, obesity, and pain.    ACTIVITY LIMITATIONS carrying, lifting, standing, squatting, and stairs   PARTICIPATION LIMITATIONS: interpersonal relationship, community activity, and   activities that require strength and endurance including walking, standing, playing with grandchildren, floor transfers, lifting.    PERSONAL FACTORS Past/current experiences, Time since onset of injury/illness/exacerbation, and 3+ comorbidities:    are also affecting patient's functional outcome.    REHAB POTENTIAL: Good   CLINICAL DECISION MAKING: Stable/uncomplicated   EVALUATION COMPLEXITY: Low     GOALS: Goals reviewed with patient? No   SHORT TERM GOALS: Target date: 11/21/2021   Patient will be independent with initial home exercise program for self-management of symptoms. Baseline: Initial HEP to be provided at  visit 2 as  appropriate (11/07/21); initial HEP provided at visit 2 (11/09/2021);  Goal status: MET     LONG TERM GOALS: Target date: 01/30/2022. Updated to 03/14/2022 on 12/20/2021 for all unmet goals.    Patient will be independent with a long-term home exercise program for self-management of symptoms.  Baseline: Initial HEP to be provided at visit 2 as appropriate (11/07/21); initial HEP provided at visit 2 (11/09/2021); participating well (12/20/2021);  Goal status: In-progress   2.  Patient will demonstrate improved FOTO by equal or greater than 10 points to demonstrate improvement in overall condition and self-reported functional ability.  Baseline: to be tested at visit 2 as appropriate (11/07/21); 62 at visit #2 (11/09/2021); 60 at visit #10 (12/20/2021);  Goal status: On-going   3.  Patient will demonstrate ability to perform single leg stance for equal or greater than 30 seconds with no trendelenburg on each side to improve her ability to complete activities that require single leg stance such as walking and floor transfers.  Baseline: R= 7 seconds, legs touching, trendelenburg present, L= 6 seconds, legs touching, trendelenburg present (11/07/21); R= 5 seconds, legs touching, less trendelenburg,  L= 12 seconds, legs touching, trendelenburg present (12/20/2021);  Goal status: In-progress   4.  Patient will demonstrate ability to ambulate equal or greater than aged matched norm of 1765 feet during 6 Minute Walk Test to improve her community mobility.  Baseline: to be tested at visit 2 as appropriate (11/07/21); 1462 feet no AD, SpO2 97%, HR 127 BPM (11/09/2021); 1640 feet no AD, SpO2 94%, HR 144 BPM (12/20/2021);  Goal status: In-progress   5.  Patient will complete community, work and/or recreational activities without limitation due to current condition.  Baseline: difficulty with activities that require strength and endurance including walking, standing, playing with grandchildren, floor transfers,  lifting (11/07/21); notes improvements but continues to have limitations (12/20/2021);  Goal status: In-progress   6.  Patient will complete Floor Transfer Test in equal or less than 8.8 seconds (cut off score for healthy elderly adult vs those with stroke) to demonstrate improved ability to get up and down from the floor to play with grandchildren.  Baseline: to be tested visit 2 as appropriate (11/07/2021); 8.94 seconds without use of chair (11/09/2021);  7.04 seconds without use of chair (12/20/2021);  Goal status: MET   7.  Patient will complete floor to waist lift with 25# box 10 repetitions in a row to demonstrate improved ability to perform lifting at home.   Baseline: to be tested visit 2 as appropriate (11/07/2021);  up to 19.5# 1 rep. (L wrist and dyspnea limiting (11/09/2021);  DISCONTINUED due to limitations from wrist condition (12/20/2021);  Goal status: DISCONTINUED 12/20/2021   PLAN: PT FREQUENCY: 1-2x/week   PT DURATION: 12 weeks   PLANNED INTERVENTIONS: Therapeutic exercises, Therapeutic activity, Neuromuscular re-education, Balance training, Gait training, Patient/Family education, Self Care, Joint mobilization, Stair training, Cryotherapy, Moist heat, Manual therapy, and Re-evaluation.   PLAN FOR NEXT SESSION: update HEP as appropriate, functional, LE, and UE strengthening and balance exercises.    Everlean Alstrom. Graylon Good, PT, DPT 02/20/22, 4:14 PM  [facility] [address] [place], [place] [address] P: [phone] I F: [phone]

## 2022-02-22 ENCOUNTER — Ambulatory Visit: Payer: BC Managed Care – PPO | Admitting: Physical Therapy

## 2022-02-27 ENCOUNTER — Ambulatory Visit: Payer: BC Managed Care – PPO | Admitting: Physical Therapy

## 2022-03-01 ENCOUNTER — Encounter: Payer: Self-pay | Admitting: Physical Therapy

## 2022-03-01 ENCOUNTER — Ambulatory Visit: Payer: BC Managed Care – PPO | Admitting: Physical Therapy

## 2022-03-01 DIAGNOSIS — R262 Difficulty in walking, not elsewhere classified: Secondary | ICD-10-CM

## 2022-03-01 DIAGNOSIS — M6281 Muscle weakness (generalized): Secondary | ICD-10-CM | POA: Diagnosis not present

## 2022-03-01 NOTE — Therapy (Signed)
OUTPATIENT PHYSICAL THERAPY TREATMENT NOTE   [name] MRN: [id number] DOB:[birth year], 62 y.o., female Today's Date: 03/01/22  PCP: Rusty Aus, MD REFERRING PROVIDER: Tresa Garter, PA  END OF SESSION:   PT End of Session - 03/01/22 1606     Visit Number 18    Number of Visits 24    Date for PT Re-Evaluation 03/14/22    Authorization Type [employer] reporting period from 12/20/2021    Progress Note Due on Visit 20    PT Start Time 1601    PT Stop Time 1640    PT Time Calculation (min) 39 min    Activity Tolerance Patient tolerated treatment well    Behavior During Therapy WFL for tasks assessed/performed               Past Medical History:  Diagnosis Date   B12 deficiency    Endometrial cancer ([place])    GERD (gastroesophageal reflux disease)    History reviewed. No pertinent surgical history. 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 female who presents to outpatient physical therapy with a referral for medical diagnosis endometrial cancer, antineoplastic chemotherapy, deconditioning. This patient's chief complaints consist of deconditioning s/p cancer dx and treatment leading to the following functional deficits: difficulty with activities that require strength and endurance including walking, standing, playing with grandchildren, lifting,  floor transfers. Relevant past medical history and comorbidities include stroke (affected left arm temporarily), GERD, left hand weakness, dyslipidemia, B12 deficiency, endometrial cancer (stage IV), anemia.  Patient denies hx of seizures, lung problems, heart problems, diabetes, unexplained weight loss, unexplained changes in bowel or bladder problems, unexplained stumbling or dropping things, and spinal surgery, known osteoporosis.   PRECAUTIONS: from OT: don't grab small things.   SUBJECTIVE: Patient reports she is feeling well today. She was sore in her arms after last PT session. Patient reports she feels like she is getting stronger and her endurance is getting better. She states she messaged her oncologist to say she is feeling good and her iron was normal, so she is wondering if her cancer medication is still working. She would like to have her scan earlier. She also had a short hot flash today. She states she has been doing her HEP.   PAIN:  Are you having pain? NPRS 0/10  OBJECTIVE  TODAY'S TREATMENT Therapeutic exercise: to centralize symptoms and improve ROM, strength, muscular endurance, and activity tolerance required for successful completion of functional activities.  - NuStep level 6 using bilateral upper and lower extremities. Seat/handle setting 10/10. For improved extremity mobility, muscular endurance, and activity tolerance; and to induce the analgesic effect of aerobic exercise, stimulate improved joint nutrition, and prepare body structures and systems for following interventions. x 5  minutes. Average SPM = 80. - squat to tap/sit on 17 inch chair with overhead press, 3x10 with 3/5/5# DB. Unable to tap chair without knee pain.  - standing alternating march with suitcase carry, 3x30 seconds each side  with 11/28/18#.  - standing double leg heel raises with front raise with thumbs up, 3x15 with 3#DB in each hand.  - step up to 12 inch step with contralateral UE support, 1x10  each side.  - standing KB deadlift, 1x10 with 20#KB on 4 inch yoga block.   Pt required multimodal cuing for proper technique and to facilitate improved neuromuscular control, strength, range of motion, and functional ability resulting in improved performance and form.    PATIENT EDUCATION:  Exercise purpose/form. Self management techniques. Person educated: Patient Education method: Explanation, demonstration, tactile and verbal cuing Education comprehension: verbalized and demonstrated understanding and needs further education     HOME EXERCISE PROGRAM: Access Code: [email or web address] URL: [email or web address] Date: 11/09/2021 Prepared by: Rosita Kea  Exercises - Sit to Stand Without Arm Support  - 3-4 x weekly - 2 sets - 10 reps - Side Stepping with Resistance at Ankles  - 3-4 x weekly - 2 sets - 20 feet - Heel Raises with Counter Support  - 3-4 x weekly - 2 sets - 10 reps   ASSESSMENT:   CLINICAL IMPRESSION: Patient arrives feeling good today and exercises were varied and progressed to include more multi-purpose functional motions and heavier resistance as tolerated to improve healthy load to bone and muscle for improved strength and function. Patient tolerated treatment well with appropriate fatigue. Plan to continue with progression as tolerated next session and work towards establishing longer term plan for continued fitness. She is concerned about safety of going to gym due to risk of infection while immunocompromised and she cannot afford to get appropriate fitness equipment needed for sufficient loading at home. The [facility] at [facility] sounds most promising to her as a place she could see herself exercising. Patient would benefit from continued management of limiting condition by skilled physical therapist to address remaining impairments and functional limitations to work towards stated goals and return to PLOF or maximal functional independence.   From Initial PT eval  11/07/2021:  Patient is a 62 y.o. female referred to outpatient physical therapy with a medical diagnosis of endometrial cancer, antineoplastic chemotherapy, deconditioning who presents with signs and symptoms consistent with generalized deconditioning weakness following cancer diagnosis and treatment. Patient also appears to have R > L sciatic nerve tension and history of hamstring strain that is likely affecting her strength and stability R LE more than left. Patient has significant weakness of bilateral hip abductors that negatively impact her ability to perform activities that require single leg stance such as floor transfers, walking, playing with her grandchildren, etc.  Patient presents with significant pain, muscle tension, muscle performance (strength/power/endurance), balance, and activity tolerance impairments that are limiting ability to complete her usual activities that require strength and endurance including walking, standing, lifting, playing with grandchildren, floor transfers without difficulty. Patient will benefit from skilled physical therapy intervention to address current body structure impairments and activity limitations to improve function and work towards goals set in current POC in order to return to prior level of function or maximal functional improvement.      OBJECTIVE IMPAIRMENTS cardiopulmonary status limiting activity, decreased activity tolerance, decreased balance, decreased endurance, decreased mobility, difficulty walking, decreased strength, impaired perceived functional ability, impaired flexibility, obesity, and pain.    ACTIVITY LIMITATIONS carrying, lifting, standing, squatting, and stairs   PARTICIPATION LIMITATIONS: interpersonal relationship, community activity, and   activities that require strength and endurance including walking, standing, playing with grandchildren, floor transfers, lifting.    PERSONAL FACTORS Past/current experiences, Time since onset  of  injury/illness/exacerbation, and 3+ comorbidities:    are also affecting patient's functional outcome.    REHAB POTENTIAL: Good   CLINICAL DECISION MAKING: Stable/uncomplicated   EVALUATION COMPLEXITY: Low     GOALS: Goals reviewed with patient? No   SHORT TERM GOALS: Target date: 11/21/2021   Patient will be independent with initial home exercise program for self-management of symptoms. Baseline: Initial HEP to be provided at visit 2 as appropriate (11/07/21); initial HEP provided at visit 2 (11/09/2021);  Goal status: MET     LONG TERM GOALS: Target date: 01/30/2022. Updated to 03/14/2022 on 12/20/2021 for all unmet goals.    Patient will be independent with a long-term home exercise program for self-management of symptoms.  Baseline: Initial HEP to be provided at visit 2 as appropriate (11/07/21); initial HEP provided at visit 2 (11/09/2021); participating well (12/20/2021);  Goal status: In-progress   2.  Patient will demonstrate improved FOTO by equal or greater than 10 points to demonstrate improvement in overall condition and self-reported functional ability.  Baseline: to be tested at visit 2 as appropriate (11/07/21); 62 at visit #2 (11/09/2021); 60 at visit #10 (12/20/2021);  Goal status: On-going   3.  Patient will demonstrate ability to perform single leg stance for equal or greater than 30 seconds with no trendelenburg on each side to improve her ability to complete activities that require single leg stance such as walking and floor transfers.  Baseline: R= 7 seconds, legs touching, trendelenburg present, L= 6 seconds, legs touching, trendelenburg present (11/07/21); R= 5 seconds, legs touching, less trendelenburg,  L= 12 seconds, legs touching, trendelenburg present (12/20/2021);  Goal status: In-progress   4.  Patient will demonstrate ability to ambulate equal or greater than aged matched norm of 1765 feet during 6 Minute Walk Test to improve her community mobility.  Baseline:  to be tested at visit 2 as appropriate (11/07/21); 1462 feet no AD, SpO2 97%, HR 127 BPM (11/09/2021); 1640 feet no AD, SpO2 94%, HR 144 BPM (12/20/2021);  Goal status: In-progress   5.  Patient will complete community, work and/or recreational activities without limitation due to current condition.  Baseline: difficulty with activities that require strength and endurance including walking, standing, playing with grandchildren, floor transfers, lifting (11/07/21); notes improvements but continues to have limitations (12/20/2021);  Goal status: In-progress   6.  Patient will complete Floor Transfer Test in equal or less than 8.8 seconds (cut off score for healthy elderly adult vs those with stroke) to demonstrate improved ability to get up and down from the floor to play with grandchildren.  Baseline: to be tested visit 2 as appropriate (11/07/2021); 8.94 seconds without use of chair (11/09/2021);  7.04 seconds without use of chair (12/20/2021);  Goal status: MET   7.  Patient will complete floor to waist lift with 25# box 10 repetitions in a row to demonstrate improved ability to perform lifting at home.   Baseline: to be tested visit 2 as appropriate (11/07/2021);  up to 19.5# 1 rep. (L wrist and dyspnea limiting (11/09/2021);  DISCONTINUED due to limitations from wrist condition (12/20/2021);  Goal status: DISCONTINUED 12/20/2021   PLAN: PT FREQUENCY: 1-2x/week   PT DURATION: 12 weeks   PLANNED INTERVENTIONS: Therapeutic exercises, Therapeutic activity, Neuromuscular re-education, Balance training, Gait training, Patient/Family education, Self Care, Joint mobilization, Stair training, Cryotherapy, Moist heat, Manual therapy, and Re-evaluation.   PLAN FOR NEXT SESSION: update HEP as appropriate, functional, LE, and UE strengthening and balance exercises.    Clarise Cruz  Carin Primrose, PT, DPT 03/01/22, 8:19 PM  [facility] [address] [place], [place] [address] P:  [phone] I F: [phone]

## 2022-03-06 ENCOUNTER — Ambulatory Visit: Payer: BC Managed Care – PPO | Admitting: Physical Therapy

## 2022-03-08 ENCOUNTER — Encounter: Payer: BC Managed Care – PPO | Admitting: Physical Therapy

## 2022-03-13 ENCOUNTER — Ambulatory Visit: Payer: BC Managed Care – PPO | Admitting: Physical Therapy

## 2022-03-13 ENCOUNTER — Encounter: Payer: Self-pay | Admitting: Physical Therapy

## 2022-03-13 DIAGNOSIS — M6281 Muscle weakness (generalized): Secondary | ICD-10-CM | POA: Diagnosis not present

## 2022-03-13 DIAGNOSIS — R262 Difficulty in walking, not elsewhere classified: Secondary | ICD-10-CM

## 2022-03-13 DIAGNOSIS — M25532 Pain in left wrist: Secondary | ICD-10-CM

## 2022-03-13 DIAGNOSIS — M79641 Pain in right hand: Secondary | ICD-10-CM

## 2022-03-13 NOTE — Therapy (Signed)
OUTPATIENT PHYSICAL THERAPY TREATMENT NOTE   [name] MRN: [id number] DOB:[birth year], 62 y.o., female Today's Date: 03/13/22  PCP: Rusty Aus, MD REFERRING PROVIDER: Tresa Garter, PA  END OF SESSION:   PT End of Session - 03/13/22 1530     Visit Number 19    Number of Visits 24    Date for PT Re-Evaluation 03/14/22    Authorization Type [employer] reporting period from 12/20/2021    Progress Note Due on Visit 20    PT Start Time 1529   patient late after struggling to get here from work   PT Stop Time 1558    PT Time Calculation (min) 29 min    Activity Tolerance Patient tolerated treatment well    Behavior During Therapy WFL for tasks assessed/performed             Past Medical History:  Diagnosis Date   B12 deficiency    Endometrial cancer ([facility])    GERD (gastroesophageal reflux disease)    History reviewed. No pertinent surgical history. 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 female who presents to outpatient physical therapy with a referral for medical diagnosis endometrial cancer, antineoplastic chemotherapy, deconditioning. This patient's chief complaints consist of deconditioning s/p cancer dx and treatment leading to the following functional deficits: difficulty with activities that  require strength and endurance including walking, standing, playing with grandchildren, lifting, floor transfers. Relevant past medical history and comorbidities include stroke (affected left arm temporarily), GERD, left hand weakness, dyslipidemia, B12 deficiency, endometrial cancer (stage IV), anemia.  Patient denies hx of seizures, lung problems, heart problems, diabetes, unexplained weight loss, unexplained changes in bowel or bladder problems, unexplained stumbling or dropping things, and spinal surgery, known osteoporosis.   PRECAUTIONS: from OT: don't grab small things.   SUBJECTIVE: Patient reports she was extremely sore after last PT session to the point that she felt it would not be good for her to come to her second PT visit last so she canceled that day. She states she was really sore especially over the quads until Saturday. She feels fine now. She states she walks every other day and states she has cramping in the muscle lateral to where she used to get shin splints (this feels different). She gets cramps one one leg lateral in the evening after she has walked, even if it is in the house for half a mile. She would like to know why this is happening. She does not want to stop walking.   PAIN:  Are you having pain? NPRS 0/10  OBJECTIVE  TODAY'S TREATMENT Therapeutic exercise: to centralize symptoms and improve ROM, strength, muscular endurance, and activity tolerance required for successful completion of functional activities.  - NuStep level 6 using bilateral upper and lower extremities. Seat/handle setting 10/10. For improved extremity mobility, muscular endurance, and activity tolerance; and to induce the analgesic effect of aerobic exercise, stimulate  improved joint nutrition, and prepare body structures and systems for following interventions. x 5  minutes. Average SPM = 90. - squat to tap/sit on 17 inch chair with overhead press, 2x10 with 3# DB. Unable to tap chair without knee pain.   - standing alternating march with suitcase carry, 2x30 seconds each side with 15#.  - standing double leg heel raises with front raise with thumbs up, 3x15 with 3#DB in each hand.  - step up to 12 inch step with contralateral UE support, 1x10 each side.  - standing KB deadlift, 2x10 with 20#KB on 4 inch yoga block.   Pt required multimodal cuing for proper technique and to facilitate improved neuromuscular control, strength, range of motion, and functional ability resulting in improved performance and form.    PATIENT EDUCATION:  Exercise purpose/form. Self management techniques. Person educated: Patient Education method: Explanation, demonstration, tactile and verbal cuing Education comprehension: verbalized and demonstrated understanding and needs further education     HOME EXERCISE PROGRAM: Access Code: [email or web address] URL: [email or web address] Date: 11/09/2021 Prepared by: Rosita Kea  Exercises - Sit to Stand Without Arm Support  - 3-4 x weekly - 2 sets - 10 reps - Side Stepping with Resistance at Ankles  - 3-4 x weekly - 2 sets - 20 feet - Heel Raises with Counter Support  - 3-4 x weekly - 2 sets - 10 reps   ASSESSMENT:   CLINICAL IMPRESSION: Patient had strong soreness with DOMS after last PT session so exercises were slightly regressed today to help prevent excessive soreness. Patient continues to require supervision and PT guidance for appropriate exercise selection and dosing. Patient would benefit from continued management of limiting condition by skilled physical therapist to address remaining impairments and functional limitations to work towards stated goals and return to PLOF or maximal functional independence.   From Initial PT eval 11/07/2021:  Patient is a 62 y.o. female referred to outpatient physical therapy with a medical diagnosis of endometrial cancer, antineoplastic chemotherapy, deconditioning who presents with signs and symptoms consistent with  generalized deconditioning weakness following cancer diagnosis and treatment. Patient also appears to have R > L sciatic nerve tension and history of hamstring strain that is likely affecting her strength and stability R LE more than left. Patient has significant weakness of bilateral hip abductors that negatively impact her ability to perform activities that require single leg stance such as floor transfers, walking, playing with her grandchildren, etc.  Patient presents with significant pain, muscle tension, muscle performance (strength/power/endurance), balance, and activity tolerance impairments that are limiting ability to complete her usual activities that require strength and endurance including walking, standing, lifting, playing with grandchildren, floor transfers without difficulty. Patient will benefit from skilled physical therapy intervention to address current body structure impairments and activity limitations to improve function and work towards goals set in current POC in order to return to prior level of function or maximal functional improvement.      OBJECTIVE IMPAIRMENTS cardiopulmonary status limiting activity, decreased activity tolerance, decreased balance, decreased endurance, decreased mobility, difficulty walking, decreased strength, impaired perceived functional ability, impaired flexibility, obesity, and pain.    ACTIVITY LIMITATIONS carrying, lifting, standing, squatting, and stairs   PARTICIPATION LIMITATIONS: interpersonal relationship, community activity, and   activities that require strength and endurance including walking, standing, playing with grandchildren, floor transfers, lifting.    PERSONAL FACTORS Past/current experiences, Time since onset of injury/illness/exacerbation, and 3+ comorbidities:    are also affecting patient's functional outcome.    REHAB POTENTIAL: Good  CLINICAL DECISION MAKING: Stable/uncomplicated   EVALUATION COMPLEXITY: Low      GOALS: Goals reviewed with patient? No   SHORT TERM GOALS: Target date: 11/21/2021   Patient will be independent with initial home exercise program for self-management of symptoms. Baseline: Initial HEP to be provided at visit 2 as appropriate (11/07/21); initial HEP provided at visit 2 (11/09/2021);  Goal status: MET     LONG TERM GOALS: Target date: 01/30/2022. Updated to 03/14/2022 on 12/20/2021 for all unmet goals.    Patient will be independent with a long-term home exercise program for self-management of symptoms.  Baseline: Initial HEP to be provided at visit 2 as appropriate (11/07/21); initial HEP provided at visit 2 (11/09/2021); participating well (12/20/2021);  Goal status: In-progress   2.  Patient will demonstrate improved FOTO by equal or greater than 10 points to demonstrate improvement in overall condition and self-reported functional ability.  Baseline: to be tested at visit 2 as appropriate (11/07/21); 62 at visit #2 (11/09/2021); 60 at visit #10 (12/20/2021);  Goal status: On-going   3.  Patient will demonstrate ability to perform single leg stance for equal or greater than 30 seconds with no trendelenburg on each side to improve her ability to complete activities that require single leg stance such as walking and floor transfers.  Baseline: R= 7 seconds, legs touching, trendelenburg present, L= 6 seconds, legs touching, trendelenburg present (11/07/21); R= 5 seconds, legs touching, less trendelenburg,  L= 12 seconds, legs touching, trendelenburg present (12/20/2021);  Goal status: In-progress   4.  Patient will demonstrate ability to ambulate equal or greater than aged matched norm of 1765 feet during 6 Minute Walk Test to improve her community mobility.  Baseline: to be tested at visit 2 as appropriate (11/07/21); 1462 feet no AD, SpO2 97%, HR 127 BPM (11/09/2021); 1640 feet no AD, SpO2 94%, HR 144 BPM (12/20/2021);  Goal status: In-progress   5.  Patient will complete  community, work and/or recreational activities without limitation due to current condition.  Baseline: difficulty with activities that require strength and endurance including walking, standing, playing with grandchildren, floor transfers, lifting (11/07/21); notes improvements but continues to have limitations (12/20/2021);  Goal status: In-progress   6.  Patient will complete Floor Transfer Test in equal or less than 8.8 seconds (cut off score for healthy elderly adult vs those with stroke) to demonstrate improved ability to get up and down from the floor to play with grandchildren.  Baseline: to be tested visit 2 as appropriate (11/07/2021); 8.94 seconds without use of chair (11/09/2021);  7.04 seconds without use of chair (12/20/2021);  Goal status: MET   7.  Patient will complete floor to waist lift with 25# box 10 repetitions in a row to demonstrate improved ability to perform lifting at home.   Baseline: to be tested visit 2 as appropriate (11/07/2021);  up to 19.5# 1 rep. (L wrist and dyspnea limiting (11/09/2021);  DISCONTINUED due to limitations from wrist condition (12/20/2021);  Goal status: DISCONTINUED 12/20/2021   PLAN: PT FREQUENCY: 1-2x/week   PT DURATION: 12 weeks   PLANNED INTERVENTIONS: Therapeutic exercises, Therapeutic activity, Neuromuscular re-education, Balance training, Gait training, Patient/Family education, Self Care, Joint mobilization, Stair training, Cryotherapy, Moist heat, Manual therapy, and Re-evaluation.   PLAN FOR NEXT SESSION: update HEP as appropriate, functional, LE, and UE strengthening and balance exercises.    Everlean Alstrom. Graylon Good, PT, DPT 03/13/22, 5:04 PM  [facility] [address] [place], [place] [address]  P: [phone] I F: [phone]

## 2022-03-15 ENCOUNTER — Encounter: Payer: BC Managed Care – PPO | Admitting: Physical Therapy

## 2022-03-21 ENCOUNTER — Ambulatory Visit: Payer: BC Managed Care – PPO | Admitting: Physical Therapy

## 2022-03-28 ENCOUNTER — Ambulatory Visit: Payer: BC Managed Care – PPO | Admitting: Physical Therapy

## 2023-05-28 ENCOUNTER — Ambulatory Visit: Admitting: Occupational Therapy

## 2023-05-31 ENCOUNTER — Ambulatory Visit: Admitting: Occupational Therapy

## 2023-07-20 NOTE — Therapy (Signed)
 OUTPATIENT OCCUPATIONAL THERAPY ORTHO EVALUATION  [name] MRN: [id number] DOB:Nov 24, 1960, 63 y.o., female Today's Date: 07/23/2023  PCP: Dr Firman Hughes REFERRING PROVIDER: Dr Firman Hughes  END OF SESSION:  OT End of Session - 07/23/23 1850     Visit Number 1    Number of Visits 12    Date for OT Re-Evaluation 10/15/23    OT Start Time 1519    OT Stop Time 1614    OT Time Calculation (min) 55 min    Activity Tolerance Patient tolerated treatment well    Behavior During Therapy WFL for tasks assessed/performed             Past Medical History:  Diagnosis Date   B12 deficiency    Endometrial cancer ([facility])    GERD (gastroesophageal reflux disease)    History reviewed. No pertinent surgical history. 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  And I talked with Dr. Annabell Key about referring me for my ring finger.  Is been bothering me for a while now.  I was waiting for the school vacation to be able to come.  It really hurts and  triggers every time. Pt accompanied by: self  PERTINENT HISTORY: PCP Dr Annabell Key Note 04/17/23 Chief Complaint  Patient presents with  Finger Pain  Patient complains of ring and small finger of the right hand x 2 months.   SUBJECTIVE: Patient has a trigger ring finger on the right, successfully has had it treated with steroid iontophoresis by physical  therapy. Has upcoming scans   PRECAUTIONS: None     WEIGHT BEARING RESTRICTIONS: No  PAIN:  Are you having pain? 8/10 pain 4th R A1pulley and with flexion of 4th  FALLS: Has patient fallen in last 6 months? 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  OBJECTIVE:  Note: Objective measures were completed at Evaluation unless otherwise noted.  HAND DOMINANCE: Right  ADLs: I have to pinch everything with my index and middle and thumb.  I cannot grab things -any gripping or lifting or carrying my finger locks  FUNCTIONAL OUTCOME MEASURES: Neck session  UPPER EXTREMITY ROM:     Active ROM Right eval Left eval  Shoulder flexion    Shoulder abduction    Shoulder adduction    Shoulder extension    Shoulder internal rotation    Shoulder external rotation    Elbow flexion    Elbow extension    Wrist flexion    Wrist extension    Wrist ulnar deviation    Wrist radial deviation    Wrist pronation    Wrist supination    (Blank rows = not tested)  Active ROM Right eval Left eval  Thumb MCP (0-60)    Thumb IP (0-80)    Thumb Radial abd/add (0-55)     Thumb Palmar abd/add (0-45)     Thumb Opposition to Small Finger     Index MCP (0-90) 80    Index PIP (0-100) 100    Index DIP (0-70)      Long MCP (0-90) 90     Long PIP (0-100) 100     Long DIP (0-70)      Ring MCP (0-90) 80     Ring PIP (0-100)  75    Ring DIP (0-70)      Little MCP (0-90) 90     Little PIP (0-100)  95    Little DIP (0-70)      (Blank rows = not tested)  d)  HAND FUNCTION: Grip strength: Right: 40 lbs; Left: 65 lbs, Lateral pinch: Right: 17 lbs, Left: 18 lbs, and 3 point pinch: Right: 15 lbs, Left: 15 lbs pain with gripping of R hand  COORDINATION: Patient compensating using only 3-point  pinch avoiding flexion of the fourth digit  SENSATION: Did not any sensory issues  EDEMA: Edema over the fourth A1 pulley on the right  COGNITION: Overall cognitive status: Within functional limits for tasks assessed      TREATMENT DATE: 07/23/23                                                                                                                            Modalities: Iontophoresis:  Type: Dexamethasone Location: R 4th A1pulley Dose: small patch at 2.0 current and decrease to 1.7  Time: 19 min  Reviewed with patient home program to do contrast 2-3 times daily Followed by passive range of motion to fourth DIP PIP pain-free 10 reps Passive range of motion of her DIP/PIP flexion intrinsic assist Avoid, positive flexion.  MC block splint fabricated for right fourth to sleep and wear with composite fist to decrease triggering and decrease pain. Patient educated on wearing as well as donning and doffing.  Patient can do several times on day ice massage over right fourth A1 pulley  Modifications reviewed with patient about enlarging grips and use palmar forearm.       PATIENT EDUCATION: Education details: findings of eval and HEP /splint wearing and iontophoresis Person educated: Patient Education method: Explanation, Demonstration, Tactile cues, Verbal cues, and Handouts Education comprehension: verbalized understanding, returned demonstration, verbal cues required, and needs further education    GOALS: Goals reviewed with patient? Yes  LONG TERM GOALS: Target date: 12 wks  Patient to be independent in home program for modalities, passive range of motion and modifications to decrease pain to less than 2/10 Baseline: Tenderness over fourth A1 pulley 8/10 with triggering every attempt of gripping or flexion-no knowledge of home program Goal status: INITIAL  2.  Pain and tenderness over the right fourth digit decreased to less than 1/10 for patient flexion of  fourth digit improving to touching  palm with no triggering Baseline: Decreased flexion of fourth digit 80 MC flexion PIP 75.  With 8/10 pain. Goal status: INITIAL  3.  Flexion of right digits improved for patient to use in ADLs and IADLs with less than 2 triggers today Baseline: Every attempt of making a fist or flexion of fourth digit on the right patient's finger triggering pain and tenderness 8/10 patient avoiding composite grip and using 2 and 3-point pinch Goal status: INITIAL  4.  Grip strength improved in right hand with 10 pounds with no triggering for patient to return to prior level of function Baseline: Triggering every attempt of flexion.  8/10 pain over the A1 pulley of the fourth digit.  Grip on the right 40 pounds with pain left 65 pounds. Goal status: INITIAL  ASSESSMENT:  CLINICAL IMPRESSION: Patient seen today for occupational therapy evaluation for right fourth digit trigger finger since January 25.  Patient was seen 2 years ago by this OT for bilateral thumb triggering.  With great improvement.  Patient referred by her PCP.  Patient with 8/10 pain over her right fourth A1 pulley.  Report triggering every attempt of gripping or composite flexion.  Decreased MCP flexion of 80 and PIP 75 on the right fourth digit.  Grip decreased by 25 pounds compared to the left with 8/10 pain.  Patient limited in functional use of right dominant hand in ADLs and IADLs.  Patient can benefit from skilled OT services to decrease pain and edema increase motion and increase strength to return to prior level of function.  PERFORMANCE DEFICITS: in functional skills including ADLs, IADLs, ROM, strength, pain, flexibility, decreased knowledge of use of DME, and UE functional use,   and psychosocial skills including environmental adaptation and routines and behaviors.   IMPAIRMENTS: are limiting patient from ADLs, IADLs, rest and sleep, play, leisure, and social participation.   COMORBIDITIES: has no  other co-morbidities that affects occupational performance. Patient will benefit from skilled OT to address above impairments and improve overall function.  MODIFICATION OR ASSISTANCE TO COMPLETE EVALUATION: No modification of tasks or assist necessary to complete an evaluation.  OT OCCUPATIONAL PROFILE AND HISTORY: Problem focused assessment: Including review of records relating to presenting problem.  CLINICAL DECISION MAKING: LOW - limited treatment options, no task modification necessary  REHAB POTENTIAL: Good for goals  EVALUATION COMPLEXITY: Low    PLAN:  OT FREQUENCY: 2x/week  OT DURATION: 12 wks  PLANNED INTERVENTIONS: 97168 OT Re-evaluation, 97535 self care/ADL training, 16109 therapeutic exercise, 97530 therapeutic activity, 97140 manual therapy, 97035 ultrasound, 97039 fluidotherapy, 97034 contrast bath, 97033 iontophoresis, 97760 Orthotic Initial, [id number] Orthotic/Prosthetic subsequent, passive range of motion, patient/family education, and DME and/or AE instructions    CONSULTED AND AGREED WITH PLAN OF CARE: Patient     Heloise Lobo, OTR/L,CLT 07/23/2023, 6:53 PM

## 2023-07-23 ENCOUNTER — Encounter: Payer: Self-pay | Admitting: Occupational Therapy

## 2023-07-23 ENCOUNTER — Ambulatory Visit: Attending: Internal Medicine | Admitting: Occupational Therapy

## 2023-07-23 DIAGNOSIS — M79641 Pain in right hand: Secondary | ICD-10-CM | POA: Diagnosis present

## 2023-07-23 DIAGNOSIS — M65341 Trigger finger, right ring finger: Secondary | ICD-10-CM | POA: Diagnosis present

## 2023-07-23 DIAGNOSIS — M6281 Muscle weakness (generalized): Secondary | ICD-10-CM | POA: Diagnosis present

## 2023-07-26 ENCOUNTER — Ambulatory Visit: Admitting: Occupational Therapy

## 2023-07-26 DIAGNOSIS — M6281 Muscle weakness (generalized): Secondary | ICD-10-CM

## 2023-07-26 DIAGNOSIS — M79641 Pain in right hand: Secondary | ICD-10-CM

## 2023-07-26 DIAGNOSIS — M65341 Trigger finger, right ring finger: Secondary | ICD-10-CM

## 2023-07-26 NOTE — Therapy (Signed)
 OUTPATIENT OCCUPATIONAL THERAPY ORTHO EVALUATION  [name] MRN: [id number] DOB:1960/04/11, 63 y.o., female Today's Date: 07/26/2023  PCP: Dr Firman Hughes REFERRING PROVIDER: Dr Firman Hughes  END OF SESSION:  OT End of Session - 07/26/23 1543     Visit Number 2    Number of Visits 12    Date for OT Re-Evaluation 10/15/23    OT Start Time 1540    OT Stop Time 1625    OT Time Calculation (min) 45 min    Activity Tolerance Patient tolerated treatment well    Behavior During Therapy WFL for tasks assessed/performed          Past Medical History:  Diagnosis Date   B12 deficiency    Endometrial cancer (HCC)    GERD (gastroesophageal reflux disease)    No past surgical history on file. 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) 12/11/2018   Left hand weakness 12/09/2018   Malignant neoplasm of endometrium (HCC) 10/28/2013    ONSET DATE: Jan 25  REFERRING DIAG: Right fourth trigger finger  THERAPY DIAG:  Pain in right hand  Trigger finger, right ring finger  Muscle weakness (generalized)  Rationale for Evaluation and Treatment: Rehabilitation  SUBJECTIVE:   SUBJECTIVE STATEMENT: I have done the ROM and wearing splint - I think I have little more motion  Pt accompanied by: self  PERTINENT HISTORY: PCP Dr Annabell Key Note 04/17/23 Chief Complaint  Patient presents with  Finger Pain  Patient complains of ring and small finger of the right hand x 2 months.   SUBJECTIVE: Patient has a trigger ring finger on the right, successfully has had it treated with steroid iontophoresis by physical therapy. Has upcoming scans   PRECAUTIONS: None     WEIGHT BEARING RESTRICTIONS: No  PAIN:  Are you having pain? 7/10 pain 4th R A1pulley and with flexion  of 4th  FALLS: Has patient fallen in last 6 months? 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  OBJECTIVE:  Note: Objective measures were completed at Evaluation unless otherwise noted.  HAND DOMINANCE: Right  ADLs: I have to pinch everything with my index and middle and thumb.  I cannot grab things -any gripping or lifting or carrying my finger locks  FUNCTIONAL OUTCOME MEASURES: Neck session  UPPER EXTREMITY ROM:     Active ROM Right eval Left eval  Shoulder flexion    Shoulder abduction    Shoulder adduction    Shoulder extension    Shoulder internal rotation    Shoulder external rotation    Elbow flexion    Elbow extension    Wrist flexion    Wrist extension    Wrist ulnar deviation    Wrist radial deviation    Wrist pronation    Wrist supination    (Blank rows = not tested)  Active ROM Right eval Left eval  Thumb MCP (0-60)    Thumb IP (0-80)    Thumb Radial abd/add (0-55)     Thumb Palmar abd/add (0-45)     Thumb Opposition to Small Finger  Index MCP (0-90) 80    Index PIP (0-100) 100    Index DIP (0-70)      Long MCP (0-90) 90     Long PIP (0-100) 100     Long DIP (0-70)      Ring MCP (0-90) 80     Ring PIP (0-100)  75    Ring DIP (0-70)      Little MCP (0-90) 90     Little PIP (0-100)  95    Little DIP (0-70)      (Blank rows = not tested)    HAND FUNCTION: Grip strength: Right: 40 lbs; Left: 65 lbs, Lateral pinch: Right: 17 lbs, Left: 18 lbs, and 3 point pinch: Right: 15 lbs, Left: 15 lbs pain with gripping of R hand  COORDINATION: Patient compensating using only 3-point pinch avoiding flexion of the fourth digit  SENSATION: Did not any sensory issues  EDEMA: Edema over the fourth A1 pulley on the right  COGNITION: Overall  cognitive status: Within functional limits for tasks assessed      TREATMENT DATE: 07/26/23       Pt arrive with her MC block splint in place  Contrast to R hand prior to soft tissue 8 min rotation heat and ice - decrease edema , stiffness and increase ROM  Done Graston tool nr 2 for sweeping and brushing over volar 4th digit prior                                                                                                                   passive range of motion to fourth DIP,  PIP  pain-free 10 reps Passive range of motion of her DIP/PIP flexion intrinsic  And composite PROM light pain free  10 reps  After contrast  NO AROM   Skin check done prior and pt to keep patch on for hour afterwards  Modalities: Iontophoresis:  Type: Dexamethasone Location: R 4th A1pulley Dose: small patch at current 1.7  Time: 20 min    Pt to cont with MC block splint or right fourth to sleep and wear with composite fist to decrease triggering and decrease pain. Patient educated on wearing as well as donning and doffing.  Patient can do several times on day ice massage over right fourth A1 pulley  Modifications reviewed with patient about enlarging grips and use palmar forearm.       PATIENT EDUCATION: Education details: findings of eval and HEP /splint wearing and iontophoresis Person educated: Patient Education method: Explanation, Demonstration, Tactile cues, Verbal cues, and Handouts Education comprehension: verbalized understanding, returned demonstration, verbal cues required, and needs further education    GOALS: Goals reviewed with patient? Yes  LONG TERM GOALS: Target date: 12 wks  Patient to be independent in home program for modalities, passive range of motion and modifications to decrease pain to less than 2/10 Baseline: Tenderness over fourth A1 pulley 8/10 with triggering every attempt of gripping or flexion-no knowledge of home program Goal status: INITIAL  2.  Pain  and tenderness over the right fourth digit decreased to less than 1/10 for patient flexion of fourth digit improving to touching palm with no triggering Baseline: Decreased flexion of fourth digit 80 MC flexion PIP 75.  With 8/10 pain. Goal status: INITIAL  3.  Flexion of right digits improved for patient to use in ADLs and IADLs with less than 2 triggers today Baseline: Every attempt of making a fist or flexion of fourth digit on the right patient's finger triggering pain and tenderness 8/10 patient avoiding composite grip and using 2 and 3-point pinch Goal status: INITIAL  4.  Grip strength improved in right hand with 10 pounds with no triggering for patient to return to prior level of function Baseline: Triggering every attempt of flexion.  8/10 pain over the A1 pulley of the fourth digit.  Grip on the right 40 pounds with pain left 65 pounds. Goal status: INITIAL  ASSESSMENT:  CLINICAL IMPRESSION: Patient seen for OT  for right fourth digit trigger finger since January 25.  Patient was seen 2 years ago by this OT for bilateral thumb triggering.  With great improvement.  Patient referred by her PCP.  Patient with 8/10 pain over her right fourth A1 pulley.  Report triggering every attempt of gripping or composite flexion.  Decreased MCP flexion of 80 and PIP 75 on the right fourth digit.  Grip decreased by 25 pounds compared to the left with 8/10 pain.  Pt tolerate 2nd session of Ionto with dexamethazone well today - PROM in session improving - tenderness still 7/10 - Patient limited in functional use of right dominant hand in ADLs and IADLs.  Patient can benefit from skilled OT services to decrease pain and edema increase motion and increase strength to return to prior level of function.  PERFORMANCE DEFICITS: in functional skills including ADLs, IADLs, ROM, strength, pain, flexibility, decreased knowledge of use of DME, and UE functional use,   and psychosocial skills including environmental  adaptation and routines and behaviors.   IMPAIRMENTS: are limiting patient from ADLs, IADLs, rest and sleep, play, leisure, and social participation.   COMORBIDITIES: has no other co-morbidities that affects occupational performance. Patient will benefit from skilled OT to address above impairments and improve overall function.  MODIFICATION OR ASSISTANCE TO COMPLETE EVALUATION: No modification of tasks or assist necessary to complete an evaluation.  OT OCCUPATIONAL PROFILE AND HISTORY: Problem focused assessment: Including review of records relating to presenting problem.  CLINICAL DECISION MAKING: LOW - limited treatment options, no task modification necessary  REHAB POTENTIAL: Good for goals  EVALUATION COMPLEXITY: Low    PLAN:  OT FREQUENCY: 2x/week  OT DURATION: 12 wks  PLANNED INTERVENTIONS: 97168 OT Re-evaluation, 97535 self care/ADL training, 21308 therapeutic exercise, 97530 therapeutic activity, 97140 manual therapy, 97035 ultrasound, 97039 fluidotherapy, 97034 contrast bath, 97033 iontophoresis, 97760 Orthotic Initial, [id number] Orthotic/Prosthetic subsequent, passive range of motion, patient/family education, and DME and/or AE instructions    CONSULTED AND AGREED WITH PLAN OF CARE: Patient     Heloise Lobo, OTR/L,CLT 07/26/2023, 5:28 PM

## 2023-07-30 ENCOUNTER — Ambulatory Visit: Admitting: Occupational Therapy

## 2023-07-31 ENCOUNTER — Ambulatory Visit: Admitting: Occupational Therapy

## 2023-07-31 DIAGNOSIS — M79641 Pain in right hand: Secondary | ICD-10-CM

## 2023-07-31 DIAGNOSIS — M65341 Trigger finger, right ring finger: Secondary | ICD-10-CM

## 2023-07-31 DIAGNOSIS — M6281 Muscle weakness (generalized): Secondary | ICD-10-CM

## 2023-07-31 NOTE — Therapy (Signed)
 OUTPATIENT OCCUPATIONAL THERAPY ORTHO TREATMENT  [name] MRN: [id number] DOB:August 05, 1960, 63 y.o., female Today's Date: 07/31/2023  PCP: Dr Firman Hughes REFERRING PROVIDER: Dr Firman Hughes  END OF SESSION:  OT End of Session - 07/31/23 1519     Visit Number 3    Number of Visits 12    Date for OT Re-Evaluation 10/15/23    OT Start Time 1519    OT Stop Time 1608    OT Time Calculation (min) 49 min    Activity Tolerance Patient tolerated treatment well    Behavior During Therapy WFL for tasks assessed/performed          Past Medical History:  Diagnosis Date   B12 deficiency    Endometrial cancer (HCC)    GERD (gastroesophageal reflux disease)    No past surgical history on file. 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) 12/11/2018   Left hand weakness 12/09/2018   Malignant neoplasm of endometrium (HCC) 10/28/2013    ONSET DATE: Jan 25  REFERRING DIAG: Right fourth trigger finger  THERAPY DIAG:  Pain in right hand  Trigger finger, right ring finger  Muscle weakness (generalized)  Rationale for Evaluation and Treatment: Rehabilitation  SUBJECTIVE:   SUBJECTIVE STATEMENT: I lost my splint -  but the pain is better and I have done my motion  Pt accompanied by: self  PERTINENT HISTORY: PCP Dr Annabell Key Note 04/17/23 Chief Complaint  Patient presents with  Finger Pain  Patient complains of ring and small finger of the right hand x 2 months.   SUBJECTIVE: Patient has a trigger ring finger on the right, successfully has had it treated with steroid iontophoresis by physical therapy. Has upcoming scans   PRECAUTIONS: None     WEIGHT BEARING RESTRICTIONS: No  PAIN:  Are you having pain? 5/10 pain 4th R A1pulley and with flexion of  4th  FALLS: Has patient fallen in last 6 months? 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  OBJECTIVE:  Note: Objective measures were completed at Evaluation unless otherwise noted.  HAND DOMINANCE: Right  ADLs: I have to pinch everything with my index and middle and thumb.  I cannot grab things -any gripping or lifting or carrying my finger locks  FUNCTIONAL OUTCOME MEASURES: Neck session  UPPER EXTREMITY ROM:     Active ROM Right eval Left eval  Shoulder flexion    Shoulder abduction    Shoulder adduction    Shoulder extension    Shoulder internal rotation    Shoulder external rotation    Elbow flexion    Elbow extension    Wrist flexion    Wrist extension    Wrist ulnar deviation    Wrist radial deviation    Wrist pronation    Wrist supination    (Blank rows = not tested)  Active ROM Right eval Left eval R 07/31/23  Thumb MCP (0-60)     Thumb IP (0-80)     Thumb Radial abd/add (0-55)      Thumb Palmar abd/add (0-45)      Thumb  Opposition to Small Finger      Index MCP (0-90) 80     Index PIP (0-100) 100     Index DIP (0-70)       Long MCP (0-90) 90      Long PIP (0-100) 100      Long DIP (0-70)       Ring MCP (0-90) 80    P 90  Ring PIP (0-100)  75   P80  Ring DIP (0-70)     P45  Little MCP (0-90) 90      Little PIP (0-100)  95     Little DIP (0-70)       (Blank rows = not tested)    HAND FUNCTION: Grip strength: Right: 40 lbs; Left: 65 lbs, Lateral pinch: Right: 17 lbs, Left: 18 lbs, and 3 point pinch: Right: 15 lbs, Left: 15 lbs pain with gripping of R hand  COORDINATION: Patient compensating using only 3-point pinch avoiding flexion of the fourth digit  SENSATION: Did not any sensory issues  EDEMA: Edema over the fourth A1 pulley on  the right  COGNITION: Overall cognitive status: Within functional limits for tasks assessed      TREATMENT DATE: 07/31/23       Pt lost her MC block splint  If you can please make my new one Moist heat to R hand prior to soft tissue 6 min  decrease stiffness and increase ROM  Done Graston tool nr 2 for sweeping and brushing over volar 4th digit prior                                                                                                                   passive range of motion to fourth DIP,  PIP  pain-free 10 reps Passive range of motion of her DIP/PIP flexion intrinsic  And composite PROM light pain free range 10 reps  Pt to do same at home NO AROM   Skin check done prior and pt to keep patch on for hour afterwards  Modalities: Iontophoresis:  Type: Dexamethasone Location: R 4th A1pulley Dose: small patch at current 2.0 - decrease after 8 min to  1.7  Time: 20 min   Fabricate pt new MC block splint for right fourth to sleep and wear with composite fist to decrease triggering and decrease pain. Patient educated on wearing as well as donning and doffing.  Patient can do several times on day ice massage over right fourth A1 pulley  Modifications reviewed with patient about enlarging grips and use palmar forearm.       PATIENT EDUCATION: Education details: findings of eval and HEP /splint wearing and iontophoresis Person educated: Patient Education method: Explanation, Demonstration, Tactile cues, Verbal cues, and Handouts Education comprehension: verbalized understanding, returned demonstration, verbal cues required, and needs further education    GOALS: Goals reviewed with patient? Yes  LONG TERM GOALS: Target date: 12 wks  Patient to be independent in home program for modalities, passive range of motion and modifications  to decrease pain to less than 2/10 Baseline: Tenderness over fourth A1 pulley 8/10 with triggering every attempt of gripping or  flexion-no knowledge of home program Goal status: INITIAL  2.  Pain and tenderness over the right fourth digit decreased to less than 1/10 for patient flexion of fourth digit improving to touching palm with no triggering Baseline: Decreased flexion of fourth digit 80 MC flexion PIP 75.  With 8/10 pain. Goal status: INITIAL  3.  Flexion of right digits improved for patient to use in ADLs and IADLs with less than 2 triggers today Baseline: Every attempt of making a fist or flexion of fourth digit on the right patient's finger triggering pain and tenderness 8/10 patient avoiding composite grip and using 2 and 3-point pinch Goal status: INITIAL  4.  Grip strength improved in right hand with 10 pounds with no triggering for patient to return to prior level of function Baseline: Triggering every attempt of flexion.  8/10 pain over the A1 pulley of the fourth digit.  Grip on the right 40 pounds with pain left 65 pounds. Goal status: INITIAL  ASSESSMENT:  CLINICAL IMPRESSION: Patient seen for OT  for right fourth digit trigger finger since January 25.  Patient was seen 2 years ago by this OT for bilateral thumb triggering.  With great improvement.  Patient referred by her PCP.  Patient with 8/10 pain over her right fourth A1 pulley.  Report triggering every attempt of gripping or composite flexion.  Decreased MCP flexion of 80 and PIP 75 on the right fourth digit.  Grip decreased by 25 pounds compared to the left with 8/10 pain.  Pt tolerate 3rd  session of Ionto with dexamethazone well today - pain decreasing and  PROM in session improving - tenderness decrease to 5/10 - Patient limited in functional use of right dominant hand in ADLs and IADLs.  Patient can benefit from skilled OT services to decrease pain and edema increase motion and increase strength to return to prior level of function.  PERFORMANCE DEFICITS: in functional skills including ADLs, IADLs, ROM, strength, pain, flexibility, decreased  knowledge of use of DME, and UE functional use,   and psychosocial skills including environmental adaptation and routines and behaviors.   IMPAIRMENTS: are limiting patient from ADLs, IADLs, rest and sleep, play, leisure, and social participation.   COMORBIDITIES: has no other co-morbidities that affects occupational performance. Patient will benefit from skilled OT to address above impairments and improve overall function.  MODIFICATION OR ASSISTANCE TO COMPLETE EVALUATION: No modification of tasks or assist necessary to complete an evaluation.  OT OCCUPATIONAL PROFILE AND HISTORY: Problem focused assessment: Including review of records relating to presenting problem.  CLINICAL DECISION MAKING: LOW - limited treatment options, no task modification necessary  REHAB POTENTIAL: Good for goals  EVALUATION COMPLEXITY: Low    PLAN:  OT FREQUENCY: 2x/week  OT DURATION: 12 wks  PLANNED INTERVENTIONS: 97168 OT Re-evaluation, 97535 self care/ADL training, 56213 therapeutic exercise, 97530 therapeutic activity, 97140 manual therapy, 97035 ultrasound, 97039 fluidotherapy, 97034 contrast bath, 97033 iontophoresis, 97760 Orthotic Initial, [id number] Orthotic/Prosthetic subsequent, passive range of motion, patient/family education, and DME and/or AE instructions    CONSULTED AND AGREED WITH PLAN OF CARE: Patient     Heloise Lobo, OTR/L,CLT 07/31/2023, 3:59 PM

## 2023-08-02 ENCOUNTER — Ambulatory Visit: Admitting: Occupational Therapy

## 2023-08-03 ENCOUNTER — Ambulatory Visit: Admitting: Occupational Therapy

## 2023-08-03 DIAGNOSIS — M79641 Pain in right hand: Secondary | ICD-10-CM | POA: Diagnosis not present

## 2023-08-03 DIAGNOSIS — M65341 Trigger finger, right ring finger: Secondary | ICD-10-CM

## 2023-08-03 DIAGNOSIS — M6281 Muscle weakness (generalized): Secondary | ICD-10-CM

## 2023-08-03 NOTE — Therapy (Signed)
 OUTPATIENT OCCUPATIONAL THERAPY ORTHO TREATMENT  [name] MRN: [id number] DOB:Jul 19, 1960, 63 y.o., female Today's Date: 08/03/2023  PCP: Dr Firman Hughes REFERRING PROVIDER: Dr Firman Hughes  END OF SESSION:  OT End of Session - 08/03/23 0956     Visit Number 4    Number of Visits 12    Date for OT Re-Evaluation 10/15/23    OT Start Time 0956    OT Stop Time 1040    OT Time Calculation (min) 44 min    Activity Tolerance Patient tolerated treatment well    Behavior During Therapy WFL for tasks assessed/performed          Past Medical History:  Diagnosis Date   B12 deficiency    Endometrial cancer (HCC)    GERD (gastroesophageal reflux disease)    No past surgical history on file. 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) 12/11/2018   Left hand weakness 12/09/2018   Malignant neoplasm of endometrium (HCC) 10/28/2013    ONSET DATE: Jan 25  REFERRING DIAG: Right fourth trigger finger  THERAPY DIAG:  Pain in right hand  Trigger finger, right ring finger  Muscle weakness (generalized)  Rationale for Evaluation and Treatment: Rehabilitation  SUBJECTIVE:   SUBJECTIVE STATEMENT: The pain and tenderness getting better.  I did not do anything this morning yet so my fingers little stiff. Pt accompanied by: self  PERTINENT HISTORY: PCP Dr Annabell Key Note 04/17/23 Chief Complaint  Patient presents with  Finger Pain  Patient complains of ring and small finger of the right hand x 2 months.   SUBJECTIVE: Patient has a trigger ring finger on the right, successfully has had it treated with steroid iontophoresis by physical therapy. Has upcoming scans   PRECAUTIONS: None     WEIGHT BEARING RESTRICTIONS: No  PAIN:  Are you having pain?  Pain  decreasing 4/10 pain 4th R A1pulley and with flexion of 4th  FALLS: Has patient fallen in last 6 months? 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  OBJECTIVE:  Note: Objective measures were completed at Evaluation unless otherwise noted.  HAND DOMINANCE: Right  ADLs: I have to pinch everything with my index and middle and thumb.  I cannot grab things -any gripping or lifting or carrying my finger locks  FUNCTIONAL OUTCOME MEASURES: Neck session  UPPER EXTREMITY ROM:     Active ROM Right eval Left eval  Shoulder flexion    Shoulder abduction    Shoulder adduction    Shoulder extension    Shoulder internal rotation    Shoulder external rotation    Elbow flexion    Elbow extension    Wrist flexion    Wrist extension    Wrist ulnar deviation    Wrist radial deviation    Wrist pronation    Wrist supination    (Blank rows = not tested)  Active ROM Right eval Left eval R 07/31/23 R 08/03/23  Thumb MCP (0-60)      Thumb IP (0-80)      Thumb Radial abd/add (0-55)  Thumb Palmar abd/add (0-45)       Thumb Opposition to Small Finger       Index MCP (0-90) 80      Index PIP (0-100) 100      Index DIP (0-70)        Long MCP (0-90) 90       Long PIP (0-100) 100       Long DIP (0-70)        Ring MCP (0-90) 80    P 90 P90  Ring PIP (0-100)  75   P80 P90  Ring DIP (0-70)     P45 P55  Little MCP (0-90) 90       Little PIP (0-100)  95      Little DIP (0-70)        (Blank rows = not tested)    HAND FUNCTION: Grip strength: Right: 40 lbs; Left: 65 lbs, Lateral pinch: Right: 17 lbs, Left: 18 lbs, and 3 point pinch: Right: 15 lbs, Left: 15 lbs pain with gripping of R hand  COORDINATION: Patient compensating using only 3-point pinch avoiding flexion of the fourth  digit  SENSATION: Did not any sensory issues  EDEMA: Edema over the fourth A1 pulley on the right  COGNITION: Overall cognitive status: Within functional limits for tasks assessed      TREATMENT DATE: 08/03/23         Assess PROM - progressing - see flowsheet Contrast -decrease stiffness and pain - prior to PROM - 8 min   Pt to cont doing at home   Done Graston tool nr 2 for sweeping and brushing over volar 4th digit prior                                                                                                                   passive range of motion to fourth DIP,  PIP  pain-free 10 reps Passive range of motion of her DIP/PIP flexion intrinsic  And composite PROM light pain free range 10 reps  Pt to do same at home NO AROM   Skin check done prior and pt to keep patch on for hour afterwards  Modalities: Iontophoresis:  Type: Dexamethasone Location: R 4th A1pulley Dose: small patch at current 1.8 Time: 22 min   Patient to continue to wear MC block splint at nighttime as well as with any activity that involve composite fisting the patient can unmodified. Patient can do several times on day ice massage over right fourth A1 pulley  Modifications reviewed with patient about enlarging grips and use palmar forearm.       PATIENT EDUCATION: Education details: findings of eval and HEP /splint wearing and iontophoresis Person educated: Patient Education method: Explanation, Demonstration, Tactile cues, Verbal cues, and Handouts Education comprehension: verbalized understanding, returned demonstration, verbal cues required, and needs further education    GOALS: Goals reviewed with patient? Yes  LONG TERM GOALS: Target date: 12 wks  Patient to be independent in home program for modalities, passive range  of motion and modifications to decrease pain to less than 2/10 Baseline: Tenderness over fourth A1 pulley 8/10 with triggering every attempt of gripping or  flexion-no knowledge of home program Goal status: INITIAL  2.  Pain and tenderness over the right fourth digit decreased to less than 1/10 for patient flexion of fourth digit improving to touching palm with no triggering Baseline: Decreased flexion of fourth digit 80 MC flexion PIP 75.  With 8/10 pain. Goal status: INITIAL  3.  Flexion of right digits improved for patient to use in ADLs and IADLs with less than 2 triggers today Baseline: Every attempt of making a fist or flexion of fourth digit on the right patient's finger triggering pain and tenderness 8/10 patient avoiding composite grip and using 2 and 3-point pinch Goal status: INITIAL  4.  Grip strength improved in right hand with 10 pounds with no triggering for patient to return to prior level of function Baseline: Triggering every attempt of flexion.  8/10 pain over the A1 pulley of the fourth digit.  Grip on the right 40 pounds with pain left 65 pounds. Goal status: INITIAL  ASSESSMENT:  CLINICAL IMPRESSION: Patient seen for OT  for right fourth digit trigger finger since January 25.  Patient was seen 2 years ago by this OT for bilateral thumb triggering.  With great improvement.  Patient referred by her PCP.  Patient with 8/10 pain over her right fourth A1 pulley.  Report triggering every attempt of gripping or composite flexion.  Decreased MCP flexion of 80 and PIP 75 on the right fourth digit.  Grip decreased by 25 pounds compared to the left with 8/10 pain.  Pt tolerate 4th  session of Ionto with dexamethazone well today - pain decreasing and  PROM improving- tenderness decrease to 4/10 - Patient limited in functional use of right dominant hand in ADLs and IADLs.  Patient can benefit from skilled OT services to decrease pain and edema increase motion and increase strength to return to prior level of function.  PERFORMANCE DEFICITS: in functional skills including ADLs, IADLs, ROM, strength, pain, flexibility, decreased knowledge of  use of DME, and UE functional use,   and psychosocial skills including environmental adaptation and routines and behaviors.   IMPAIRMENTS: are limiting patient from ADLs, IADLs, rest and sleep, play, leisure, and social participation.   COMORBIDITIES: has no other co-morbidities that affects occupational performance. Patient will benefit from skilled OT to address above impairments and improve overall function.  MODIFICATION OR ASSISTANCE TO COMPLETE EVALUATION: No modification of tasks or assist necessary to complete an evaluation.  OT OCCUPATIONAL PROFILE AND HISTORY: Problem focused assessment: Including review of records relating to presenting problem.  CLINICAL DECISION MAKING: LOW - limited treatment options, no task modification necessary  REHAB POTENTIAL: Good for goals  EVALUATION COMPLEXITY: Low    PLAN:  OT FREQUENCY: 2x/week  OT DURATION: 12 wks  PLANNED INTERVENTIONS: 97168 OT Re-evaluation, 97535 self care/ADL training, 29562 therapeutic exercise, 97530 therapeutic activity, 97140 manual therapy, 97035 ultrasound, 97039 fluidotherapy, 97034 contrast bath, 97033 iontophoresis, 97760 Orthotic Initial, [id number] Orthotic/Prosthetic subsequent, passive range of motion, patient/family education, and DME and/or AE instructions    CONSULTED AND AGREED WITH PLAN OF CARE: Patient     Heloise Lobo, OTR/L,CLT 08/03/2023, 12:24 PM

## 2023-08-13 ENCOUNTER — Ambulatory Visit: Admitting: Occupational Therapy

## 2023-08-13 DIAGNOSIS — M6281 Muscle weakness (generalized): Secondary | ICD-10-CM

## 2023-08-13 DIAGNOSIS — M65341 Trigger finger, right ring finger: Secondary | ICD-10-CM

## 2023-08-13 DIAGNOSIS — M79641 Pain in right hand: Secondary | ICD-10-CM | POA: Diagnosis not present

## 2023-08-13 NOTE — Therapy (Signed)
 OUTPATIENT OCCUPATIONAL THERAPY ORTHO TREATMENT  [name] MRN: [id number] DOB:08-21-1960, 63 y.o., female Today's Date: 08/13/2023  PCP: Dr Oneil Pinal REFERRING PROVIDER: Dr Oneil Pinal  END OF SESSION:  OT End of Session - 08/13/23 1045     Visit Number 5    Number of Visits 12    Date for OT Re-Evaluation 10/15/23    OT Start Time 1045    Activity Tolerance Patient tolerated treatment well    Behavior During Therapy [facility] for tasks assessed/performed          Past Medical History:  Diagnosis Date   B12 deficiency    Endometrial cancer (HCC)    GERD (gastroesophageal reflux disease)    No past surgical history on file. 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) 12/11/2018   Left hand weakness 12/09/2018   Malignant neoplasm of endometrium (HCC) 10/28/2013    ONSET DATE: Jan 25  REFERRING DIAG: Right fourth trigger finger  THERAPY DIAG:  Pain in right hand  Trigger finger, right ring finger  Muscle weakness (generalized)  Rationale for Evaluation and Treatment: Rehabilitation  SUBJECTIVE:   SUBJECTIVE STATEMENT: Is a little bit better.  I think the tenderness.  While he was at the beach I done my exercises more  accompanied by: self  PERTINENT HISTORY: PCP Dr Pinal Note 04/17/23 Chief Complaint  Patient presents with  Finger Pain  Patient complains of ring and small finger of the right hand x 2 months.   SUBJECTIVE: Patient has a trigger ring finger on the right, successfully has had it treated with steroid iontophoresis by physical therapy. Has upcoming scans   PRECAUTIONS: None     WEIGHT BEARING RESTRICTIONS: No  PAIN:  Are you having pain?  Pain decreasing 4/10 pain 4th R A1pulley and with flexion of 4th  FALLS: Has  patient fallen in last 6 months? 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  OBJECTIVE:  Note: Objective measures were completed at Evaluation unless otherwise noted.  HAND DOMINANCE: Right  ADLs: I have to pinch everything with my index and middle and thumb.  I cannot grab things -any gripping or lifting or carrying my finger locks  FUNCTIONAL OUTCOME MEASURES: Neck session  UPPER EXTREMITY ROM:     Active ROM Right eval Left eval  Shoulder flexion    Shoulder abduction    Shoulder adduction    Shoulder extension    Shoulder internal rotation    Shoulder external rotation    Elbow flexion    Elbow extension    Wrist flexion    Wrist extension    Wrist ulnar deviation    Wrist radial deviation    Wrist pronation    Wrist supination    (Blank rows = not tested)  Active ROM Right eval Left eval R 07/31/23 R 08/03/23  Thumb MCP (0-60)      Thumb IP (0-80)      Thumb Radial abd/add (0-55)       Thumb Palmar abd/add (0-45)       Thumb Opposition to Small  Finger       Index MCP (0-90) 80      Index PIP (0-100) 100      Index DIP (0-70)        Long MCP (0-90) 90       Long PIP (0-100) 100       Long DIP (0-70)        Ring MCP (0-90) 80    P 90 P90  Ring PIP (0-100)  75   P80 P90  Ring DIP (0-70)     P45 P55  Little MCP (0-90) 90       Little PIP (0-100)  95      Little DIP (0-70)        (Blank rows = not tested)    HAND FUNCTION: Grip strength: Right: 40 lbs; Left: 65 lbs, Lateral pinch: Right: 17 lbs, Left: 18 lbs, and 3 point pinch: Right: 15 lbs, Left: 15 lbs pain with gripping of R hand  COORDINATION: Patient compensating using only 3-point pinch avoiding flexion of the fourth digit  SENSATION: Did not any sensory issues  EDEMA: Edema over the  fourth A1 pulley on the right  COGNITION: Overall cognitive status: Within functional limits for tasks assessed      TREATMENT DATE: 08/13/23         Assess PROM - progressing - DIP 60 Contrast -decrease stiffness and pain - prior to PROM - 8 min   Pt to cont doing at home   Done Graston tool nr 2 for sweeping and brushing over volar 4th digit prior      ROM                                                                                                             passive range of motion to fourth DIP,  PIP  pain-free 10 reps Passive range of motion of her DIP/PIP flexion intrinsic  And composite PROM light pain free range 10 reps  Pt to do same at home NO AROM   Skin check done prior and pt to keep patch on for hour afterwards  Modalities: Iontophoresis:  Type: Dexamethasone Location: R 4th A1pulley Dose: med patch at current 2.0 current - decrease to 1.7  Time: 22 min   Patient to continue to wear MC block splint at nighttime as well as with any activity that involve composite fisting the patient can unmodified. Patient can do several times on day ice massage over right fourth A1 pulley  Modifications reviewed with patient about enlarging grips and use palmar forearm.       PATIENT EDUCATION: Education details: findings of eval and HEP /splint wearing and iontophoresis Person educated: Patient Education method: Explanation, Demonstration, Tactile cues, Verbal cues, and Handouts Education comprehension: verbalized understanding, returned demonstration, verbal cues required, and needs further education    GOALS: Goals reviewed with patient? Yes  LONG TERM GOALS: Target date: 12 wks  Patient to be independent in home program for modalities, passive range of motion and modifications to decrease pain to  less than 2/10 Baseline: Tenderness over fourth A1 pulley 8/10 with triggering every attempt of gripping or flexion-no knowledge of home program Goal status:  INITIAL  2.  Pain and tenderness over the right fourth digit decreased to less than 1/10 for patient flexion of fourth digit improving to touching palm with no triggering Baseline: Decreased flexion of fourth digit 80 MC flexion PIP 75.  With 8/10 pain. Goal status: INITIAL  3.  Flexion of right digits improved for patient to use in ADLs and IADLs with less than 2 triggers today Baseline: Every attempt of making a fist or flexion of fourth digit on the right patient's finger triggering pain and tenderness 8/10 patient avoiding composite grip and using 2 and 3-point pinch Goal status: INITIAL  4.  Grip strength improved in right hand with 10 pounds with no triggering for patient to return to prior level of function Baseline: Triggering every attempt of flexion.  8/10 pain over the A1 pulley of the fourth digit.  Grip on the right 40 pounds with pain left 65 pounds. Goal status: INITIAL  ASSESSMENT:  CLINICAL IMPRESSION: Patient seen for OT  for right fourth digit trigger finger since January 25.  Patient was seen 2 years ago by this OT for bilateral thumb triggering.  With great improvement.  Patient referred by her PCP.  Patient with 8/10 pain over her right fourth A1 pulley.  Report triggering every attempt of gripping or composite flexion.  Decreased MCP flexion of 80 and PIP 75 on the right fourth digit.  Grip decreased by 25 pounds compared to the left with 8/10 pain.  Pt tolerate 5th  session of Ionto with dexamethazone well today after being on vacation - pain decreasing and  PROM improving- tenderness decrease to 4/10 - Patient limited in functional use of right dominant hand in ADLs and IADLs.  Patient can benefit from skilled OT services to decrease pain and edema increase motion and increase strength to return to prior level of function.  PERFORMANCE DEFICITS: in functional skills including ADLs, IADLs, ROM, strength, pain, flexibility, decreased knowledge of use of DME, and UE  functional use,   and psychosocial skills including environmental adaptation and routines and behaviors.   IMPAIRMENTS: are limiting patient from ADLs, IADLs, rest and sleep, play, Soto, and social participation.   COMORBIDITIES: has no other co-morbidities that affects occupational performance. Patient will benefit from skilled OT to address above impairments and improve overall function.  MODIFICATION OR ASSISTANCE TO COMPLETE EVALUATION: No modification of tasks or assist necessary to complete an evaluation.  OT OCCUPATIONAL PROFILE AND HISTORY: Problem focused assessment: Including review of records relating to presenting problem.  CLINICAL DECISION MAKING: LOW - limited treatment options, no task modification necessary  REHAB POTENTIAL: Good for goals  EVALUATION COMPLEXITY: Low    PLAN:  OT FREQUENCY: 2x/week  OT DURATION: 12 wks  PLANNED INTERVENTIONS: 97168 OT Re-evaluation, 97535 self care/ADL training, 02889 therapeutic exercise, 97530 therapeutic activity, 97140 manual therapy, 97035 ultrasound, 97039 fluidotherapy, 97034 contrast bath, 97033 iontophoresis, 97760 Orthotic Initial, [id number] Orthotic/Prosthetic subsequent, passive range of motion, patient/family education, and DME and/or AE instructions    CONSULTED AND AGREED WITH PLAN OF CARE: Patient     Ancel Peters, OTR/L,CLT 08/13/2023, 10:45 AM

## 2023-08-20 ENCOUNTER — Ambulatory Visit: Attending: Internal Medicine | Admitting: Occupational Therapy

## 2023-08-20 DIAGNOSIS — M65341 Trigger finger, right ring finger: Secondary | ICD-10-CM | POA: Insufficient documentation

## 2023-08-20 DIAGNOSIS — M79641 Pain in right hand: Secondary | ICD-10-CM | POA: Diagnosis present

## 2023-08-20 DIAGNOSIS — M6281 Muscle weakness (generalized): Secondary | ICD-10-CM | POA: Diagnosis present

## 2023-08-23 ENCOUNTER — Ambulatory Visit: Admitting: Occupational Therapy

## 2023-08-23 DIAGNOSIS — M6281 Muscle weakness (generalized): Secondary | ICD-10-CM

## 2023-08-23 DIAGNOSIS — M79641 Pain in right hand: Secondary | ICD-10-CM

## 2023-08-23 DIAGNOSIS — M65341 Trigger finger, right ring finger: Secondary | ICD-10-CM

## 2023-08-25 ENCOUNTER — Encounter: Payer: Self-pay | Admitting: Occupational Therapy

## 2023-08-25 NOTE — Therapy (Signed)
 OUTPATIENT OCCUPATIONAL THERAPY ORTHO TREATMENT  [name] MRN: [id number] DOB:1960/08/06, 63 y.o., female Today's Date: 08/25/2023  PCP: Dr Oneil Pinal REFERRING PROVIDER: Dr Oneil Pinal  END OF SESSION:  OT End of Session - 08/25/23 1140     Visit Number 6    Number of Visits 12    Date for OT Re-Evaluation 10/15/23    OT Start Time 1114    OT Stop Time 1203    OT Time Calculation (min) 49 min    Activity Tolerance Patient tolerated treatment well    Behavior During Therapy WFL for tasks assessed/performed          Past Medical History:  Diagnosis Date   B12 deficiency    Endometrial cancer (HCC)    GERD (gastroesophageal reflux disease)    History reviewed. No pertinent surgical history. 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) 12/11/2018   Left hand weakness 12/09/2018   Malignant neoplasm of endometrium (HCC) 10/28/2013    ONSET DATE: Jan 25  REFERRING DIAG: Right fourth trigger finger  THERAPY DIAG:  Pain in right hand  Trigger finger, right ring finger  Muscle weakness (generalized)  Rationale for Evaluation and Treatment: Rehabilitation  SUBJECTIVE:   SUBJECTIVE STATEMENT: Is a little bit better.  I think the tenderness.  While he was at the beach I done my exercises more  accompanied by: self  PERTINENT HISTORY: PCP Dr Pinal Note 04/17/23 Chief Complaint  Patient presents with  Finger Pain  Patient complains of ring and small finger of the right hand x 2 months.   SUBJECTIVE: Patient has a trigger ring finger on the right, successfully has had it treated with steroid iontophoresis by physical therapy. Has upcoming scans   PRECAUTIONS: None     WEIGHT BEARING RESTRICTIONS: No  PAIN:  Are you having pain?  Pain  decreasing 4/10 pain 4th R A1pulley and with flexion of 4th  FALLS: Has patient fallen in last 6 months? 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  OBJECTIVE:  Note: Objective measures were completed at Evaluation unless otherwise noted.  HAND DOMINANCE: Right  ADLs: I have to pinch everything with my index and middle and thumb.  I cannot grab things -any gripping or lifting or carrying my finger locks  FUNCTIONAL OUTCOME MEASURES: Neck session  UPPER EXTREMITY ROM:     Active ROM Right eval Left eval  Shoulder flexion    Shoulder abduction    Shoulder adduction    Shoulder extension    Shoulder internal rotation    Shoulder external rotation    Elbow flexion    Elbow extension    Wrist flexion    Wrist extension    Wrist ulnar deviation    Wrist radial deviation    Wrist pronation    Wrist supination    (Blank rows = not tested)  Active ROM Right eval Left eval R 07/31/23 R 08/03/23  Thumb MCP (0-60)      Thumb IP (0-80)      Thumb Radial abd/add (0-55)  Thumb Palmar abd/add (0-45)       Thumb Opposition to Small Finger       Index MCP (0-90) 80      Index PIP (0-100) 100      Index DIP (0-70)        Long MCP (0-90) 90       Long PIP (0-100) 100       Long DIP (0-70)        Ring MCP (0-90) 80    P 90 P90  Ring PIP (0-100)  75   P80 P90  Ring DIP (0-70)     P45 P55  Little MCP (0-90) 90       Little PIP (0-100)  95      Little DIP (0-70)        (Blank rows = not tested)    HAND FUNCTION: Grip strength: Right: 40 lbs; Left: 65 lbs, Lateral pinch: Right: 17 lbs, Left: 18 lbs, and 3 point pinch: Right: 15 lbs, Left: 15 lbs pain with gripping of R hand  COORDINATION: Patient compensating using only 3-point pinch avoiding flexion of the fourth  digit  SENSATION: Did not any sensory issues  EDEMA: Edema over the fourth A1 pulley on the right  COGNITION: Overall cognitive status: Within functional limits for tasks assessed      TREATMENT DATE: 08/20/23        Pt reports she is improving with therapy, decreased pain and stiffness noted.  Will be traveling next week to [place]  for chemo treatment at [facility]    Contrast: Pt seen for use of contrast to right hand and digits to decrease edema, pain and increase tissue mobility.    Manual therapy:  Manual soft tissue massage to right hand to mobilize tissue, promote lymphatic drainage and decrease adhesions.  Use of Graston tool nr 2 for sweeping and brushing over volar 4th digit prior to performing ROM exercises.     Therapeutic Exercises:                                                                                                           Passive range of motion to fourth DIP,  PIP  pain-free 10 reps Passive range of motion of her DIP/PIP for flexion, intrinsics Composite PROM light pain free range, 10 reps   NO AROM recommended to avoid triggering and while decreasing inflammation to the area.  Modalities: Iontophoresis:  Type: Dexamethasone Location: R 4th A1pulley Dose: med patch at current 1.8, attempting to keep hand flat to keep patch in contact to tolerate intensity Time: 24 min Skin check performed prior to ionto with no issues and pt to keep patch on for hour afterwards   Patient to continue to wear MC block splint at nighttime as well as with any activity that involve composite fisting the patient can unmodified.  Continued discussion regarding modifications with patient about enlarging grips and use palmar forearm while engaged in tasks.     PATIENT EDUCATION: Education details: findings of eval and HEP /splint wearing  and iontophoresis Person educated: Patient Education method: Explanation, Demonstration, Tactile cues, Verbal cues, and  Handouts Education comprehension: verbalized understanding, returned demonstration, verbal cues required, and needs further education    GOALS: Goals reviewed with patient? Yes  LONG TERM GOALS: Target date: 12 wks  Patient to be independent in home program for modalities, passive range of motion and modifications to decrease pain to less than 2/10 Baseline: Tenderness over fourth A1 pulley 8/10 with triggering every attempt of gripping or flexion-no knowledge of home program Goal status: INITIAL  2.  Pain and tenderness over the right fourth digit decreased to less than 1/10 for patient flexion of fourth digit improving to touching palm with no triggering Baseline: Decreased flexion of fourth digit 80 MC flexion PIP 75.  With 8/10 pain. Goal status: INITIAL  3.  Flexion of right digits improved for patient to use in ADLs and IADLs with less than 2 triggers today Baseline: Every attempt of making a fist or flexion of fourth digit on the right patient's finger triggering pain and tenderness 8/10 patient avoiding composite grip and using 2 and 3-point pinch Goal status: INITIAL  4.  Grip strength improved in right hand with 10 pounds with no triggering for patient to return to prior level of function Baseline: Triggering every attempt of flexion.  8/10 pain over the A1 pulley of the fourth digit.  Grip on the right 40 pounds with pain left 65 pounds. Goal status: INITIAL  ASSESSMENT:  CLINICAL IMPRESSION: Patient seen for OT  for right fourth digit trigger finger since January 25.  Patient was seen 2 years ago by this OT for bilateral thumb triggering, with great improvement.  Patient referred by her PCP.  Patient with 8/10 pain over her right fourth A1 pulley at eval.  Report triggering every attempt of gripping or composite flexion.  Decreased MCP flexion of 80 and PIP 75 on the right fourth digit.  Grip decreased by 25 pounds compared to the left with 8/10 pain.  Pt tolerate 6th session  of Ionto with dexamethazone well,  pain continues to decrease and  PROM improving.  Pain rated around 4/10 this date. Pt will be traveling to [place]  for additional treatment for cancer next week.  She has been using hand more this week for functional tasks while preparing for trip. Patient limited in functional use of right dominant hand in ADLs and IADLs.  Patient can benefit from skilled OT services to decrease pain and edema increase motion and increase strength to return to prior level of function.  PERFORMANCE DEFICITS: in functional skills including ADLs, IADLs, ROM, strength, pain, flexibility, decreased knowledge of use of DME, and UE functional use,   and psychosocial skills including environmental adaptation and routines and behaviors.   IMPAIRMENTS: are limiting patient from ADLs, IADLs, rest and sleep, play, leisure, and social participation.   COMORBIDITIES: has no other co-morbidities that affects occupational performance. Patient will benefit from skilled OT to address above impairments and improve overall function.  MODIFICATION OR ASSISTANCE TO COMPLETE EVALUATION: No modification of tasks or assist necessary to complete an evaluation.  OT OCCUPATIONAL PROFILE AND HISTORY: Problem focused assessment: Including review of records relating to presenting problem.  CLINICAL DECISION MAKING: LOW - limited treatment options, no task modification necessary  REHAB POTENTIAL: Good for goals  EVALUATION COMPLEXITY: Low    PLAN:  OT FREQUENCY: 2x/week  OT DURATION: 12 wks  PLANNED INTERVENTIONS: 97168 OT Re-evaluation, 97535 self care/ADL training, 02889 therapeutic exercise, 97530 therapeutic  activity, 97140 manual therapy, 97035 ultrasound, 02960 fluidotherapy, 97034 contrast bath, 97033 iontophoresis, 97760 Orthotic Initial, [id number] Orthotic/Prosthetic subsequent, passive range of motion, patient/family education, and DME and/or AE instructions  CONSULTED AND AGREED WITH PLAN OF  CARE: Patient   Prabhleen Montemayor, OTR/L,CLT 08/25/2023, 11:43 AM

## 2023-08-25 NOTE — Therapy (Signed)
 OUTPATIENT OCCUPATIONAL THERAPY ORTHO TREATMENT  [name] MRN: [id number] DOB:1960-04-10, 63 y.o., female Today's Date: 08/25/2023  PCP: Dr Oneil Pinal REFERRING PROVIDER: Dr Oneil Pinal  END OF SESSION:  OT End of Session - 08/25/23 1246     Visit Number 7    Number of Visits 12    Date for OT Re-Evaluation 10/15/23    OT Start Time 1116    OT Stop Time 1210    OT Time Calculation (min) 54 min    Activity Tolerance Patient tolerated treatment well    Behavior During Therapy WFL for tasks assessed/performed          Past Medical History:  Diagnosis Date   B12 deficiency    Endometrial cancer (HCC)    GERD (gastroesophageal reflux disease)    History reviewed. No pertinent surgical history. 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) 12/11/2018   Left hand weakness 12/09/2018   Malignant neoplasm of endometrium (HCC) 10/28/2013    ONSET DATE: Jan 25  REFERRING DIAG: Right fourth trigger finger  THERAPY DIAG:  Pain in right hand  Trigger finger, right ring finger  Muscle weakness (generalized)  Rationale for Evaluation and Treatment: Rehabilitation  SUBJECTIVE:   SUBJECTIVE STATEMENT: See below in treatment note for subjective accompanied by: self  PERTINENT HISTORY: PCP Dr Pinal Note 04/17/23 Chief Complaint  Patient presents with  Finger Pain  Patient complains of ring and small finger of the right hand x 2 months.   SUBJECTIVE: Patient has a trigger ring finger on the right, successfully has had it treated with steroid iontophoresis by physical therapy. Has upcoming scans   PRECAUTIONS: None  WEIGHT BEARING RESTRICTIONS: No  PAIN:  Are you having pain?  Pain decreasing 4/10 pain 4th R A1pulley and with flexion of  4th  FALLS: Has patient fallen in last 6 months? 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  OBJECTIVE:  Note: Objective measures were completed at Evaluation unless otherwise noted.  HAND DOMINANCE: Right  ADLs: I have to pinch everything with my index and middle and thumb.  I cannot grab things -any gripping or lifting or carrying my finger locks  FUNCTIONAL OUTCOME MEASURES: Neck session  UPPER EXTREMITY ROM:     Active ROM Right eval Left eval  Shoulder flexion    Shoulder abduction    Shoulder adduction    Shoulder extension    Shoulder internal rotation    Shoulder external rotation    Elbow flexion    Elbow extension    Wrist flexion    Wrist extension    Wrist ulnar deviation    Wrist radial deviation    Wrist pronation    Wrist supination    (Blank rows = not tested)  Active ROM Right eval Left eval R 07/31/23 R 08/03/23  Thumb MCP (0-60)      Thumb IP (0-80)      Thumb Radial abd/add (0-55)       Thumb Palmar abd/add (0-45)       Thumb Opposition to Small Finger  Index MCP (0-90) 80      Index PIP (0-100) 100      Index DIP (0-70)        Long MCP (0-90) 90       Long PIP (0-100) 100       Long DIP (0-70)        Ring MCP (0-90) 80    P 90 P90  Ring PIP (0-100)  75   P80 P90  Ring DIP (0-70)     P45 P55  Little MCP (0-90) 90       Little PIP (0-100)  95      Little DIP (0-70)        (Blank rows = not tested)    HAND FUNCTION: Grip strength: Right: 40 lbs; Left: 65 lbs, Lateral pinch: Right: 17 lbs, Left: 18 lbs, and 3 point pinch: Right: 15 lbs, Left: 15 lbs pain with gripping of R hand  COORDINATION: Patient compensating using only 3-point pinch avoiding flexion of the fourth digit  SENSATION: Did not any sensory issues  EDEMA:  Edema over the fourth A1 pulley on the right  COGNITION: Overall cognitive status: Within functional limits for tasks assessed  TREATMENT DATE: 08/23/23        Pt continues to do well, getting ready for her trip to [place]  for cancer treatment.  She has been working with the Child psychotherapist to arrange her accommodations.   Pain 3/10 continually decreasing over time.    Contrast: Pt seen for use of contrast to right hand and digits to decrease edema, pain and increase tissue mobility, performed prior to range of motion exercises.     Manual therapy:  Pt seen for therapist directed manual soft tissue massage to right hand to mobilize tissue, promote lymphatic drainage and decrease adhesions.  Use of Graston tool nr 2 for sweeping and brushing over volar 4th digit prior to performing ROM exercises.     Therapeutic Exercises:                                                                                                           Passive range of motion to fourth DIP,  PIP  pain-free 10 reps Passive range of motion of her DIP/PIP for flexion, intrinsics Composite PROM light pain free range, 10 reps Cues for exercises and reminder for no AROM for flexion at this point to avoid triggering.   NO AROM recommended to avoid triggering and while decreasing inflammation to the area.  Modalities: Iontophoresis:  Type: Dexamethasone Location: R 4th A1pulley Dose: med patch at current 1.8-2.0 , kept hand flat to keep patch in contact to tolerate intensity and able to tolerate ionto more today than last couple of sessions. Time: 21 min Skin check performed prior to ionto with no issues and pt to keep patch on for hour afterwards   Patient to continue HEP while away next week with use of contrast, PROM,  to wear MC block splint at nighttime as well as with any activity that involve composite fisting the patient  can unmodified.    PATIENT EDUCATION: Education details: findings of eval and HEP /splint  wearing and iontophoresis Person educated: Patient Education method: Explanation, Demonstration, Tactile cues, Verbal cues, and Handouts Education comprehension: verbalized understanding, returned demonstration, verbal cues required, and needs further education    GOALS: Goals reviewed with patient? Yes  LONG TERM GOALS: Target date: 12 wks  Patient to be independent in home program for modalities, passive range of motion and modifications to decrease pain to less than 2/10 Baseline: Tenderness over fourth A1 pulley 8/10 with triggering every attempt of gripping or flexion-no knowledge of home program Goal status: INITIAL  2.  Pain and tenderness over the right fourth digit decreased to less than 1/10 for patient flexion of fourth digit improving to touching palm with no triggering Baseline: Decreased flexion of fourth digit 80 MC flexion PIP 75.  With 8/10 pain. Goal status: INITIAL  3.  Flexion of right digits improved for patient to use in ADLs and IADLs with less than 2 triggers today Baseline: Every attempt of making a fist or flexion of fourth digit on the right patient's finger triggering pain and tenderness 8/10 patient avoiding composite grip and using 2 and 3-point pinch Goal status: INITIAL  4.  Grip strength improved in right hand with 10 pounds with no triggering for patient to return to prior level of function Baseline: Triggering every attempt of flexion.  8/10 pain over the A1 pulley of the fourth digit.  Grip on the right 40 pounds with pain left 65 pounds. Goal status: INITIAL  ASSESSMENT:  CLINICAL IMPRESSION: Patient seen for OT  for right fourth digit trigger finger since January 25.  Patient was seen 2 years ago by this OT for bilateral thumb triggering, with great improvement.  Patient referred by her PCP.  Patient with 8/10 pain over her right fourth A1 pulley at eval.  Report triggering every attempt of gripping or composite flexion.  Decreased MCP flexion of 80  and PIP 75 on the right fourth digit.  Grip decreased by 25 pounds compared to the left with 8/10 pain at eval.  Pt tolerate 7th session of Ionto with dexamethazone well,  pain continues to decrease and  PROM improving.  Pain rated around 3/10 this date. Pt will be traveling to [place]  for additional treatment for cancer next week.  She has been using hand more this week for functional tasks while preparing for trip and has occasional achiness.  Wearing her splint when performing active tasks to help support finger to avoid triggering.  She is aware she will need to continue with home program while she is away and will follow up with therapy when she returns.  She will be gone the whole next week. Patient limited in functional use of right dominant hand in ADLs and IADLs.  Patient can benefit from skilled OT services to decrease pain and edema increase motion and increase strength to return to prior level of function.  PERFORMANCE DEFICITS: in functional skills including ADLs, IADLs, ROM, strength, pain, flexibility, decreased knowledge of use of DME, and UE functional use,   and psychosocial skills including environmental adaptation and routines and behaviors.   IMPAIRMENTS: are limiting patient from ADLs, IADLs, rest and sleep, play, leisure, and social participation.   COMORBIDITIES: has no other co-morbidities that affects occupational performance. Patient will benefit from skilled OT to address above impairments and improve overall function.  MODIFICATION OR ASSISTANCE TO COMPLETE EVALUATION: No modification of tasks or assist necessary to  complete an evaluation.  OT OCCUPATIONAL PROFILE AND HISTORY: Problem focused assessment: Including review of records relating to presenting problem.  CLINICAL DECISION MAKING: LOW - limited treatment options, no task modification necessary  REHAB POTENTIAL: Good for goals  EVALUATION COMPLEXITY: Low  PLAN:  OT FREQUENCY: 2x/week  OT DURATION: 12  wks  PLANNED INTERVENTIONS: 97168 OT Re-evaluation, 97535 self care/ADL training, 02889 therapeutic exercise, 97530 therapeutic activity, 97140 manual therapy, 97035 ultrasound, 97039 fluidotherapy, 97034 contrast bath, 97033 iontophoresis, 97760 Orthotic Initial, [id number] Orthotic/Prosthetic subsequent, passive range of motion, patient/family education, and DME and/or AE instructions  CONSULTED AND AGREED WITH PLAN OF CARE: Patient   Kerie Badger, OTR/L,CLT 08/25/2023, 12:47 PM

## 2023-08-27 ENCOUNTER — Ambulatory Visit: Admitting: Occupational Therapy

## 2023-08-30 ENCOUNTER — Encounter: Admitting: Occupational Therapy

## 2023-09-11 ENCOUNTER — Ambulatory Visit: Admitting: Occupational Therapy

## 2023-09-13 ENCOUNTER — Ambulatory Visit: Admitting: Occupational Therapy

## 2023-09-17 ENCOUNTER — Ambulatory Visit: Attending: Internal Medicine | Admitting: Occupational Therapy

## 2023-09-17 DIAGNOSIS — M79641 Pain in right hand: Secondary | ICD-10-CM | POA: Diagnosis present

## 2023-09-17 DIAGNOSIS — M6281 Muscle weakness (generalized): Secondary | ICD-10-CM | POA: Diagnosis present

## 2023-09-17 DIAGNOSIS — M65341 Trigger finger, right ring finger: Secondary | ICD-10-CM | POA: Insufficient documentation

## 2023-09-17 DIAGNOSIS — M65331 Trigger finger, right middle finger: Secondary | ICD-10-CM | POA: Insufficient documentation

## 2023-09-17 NOTE — Therapy (Signed)
 OUTPATIENT OCCUPATIONAL THERAPY ORTHO TREATMENT  [name] MRN: [id number] DOB:[birth year], 63 y.o., female Today's Date: 09/17/2023  PCP: Dr Oneil Pinal REFERRING PROVIDER: Dr Oneil Pinal  END OF SESSION:  OT End of Session - 09/17/23 1701     Visit Number 8    Number of Visits 12    Date for OT Re-Evaluation 10/15/23    OT Start Time 1435    OT Stop Time 1522    OT Time Calculation (min) 47 min    Activity Tolerance Patient tolerated treatment well    Behavior During Therapy WFL for tasks assessed/performed          Past Medical History:  Diagnosis Date   B12 deficiency    Endometrial cancer (HCC)    GERD (gastroesophageal reflux disease)    No past surgical history on file. 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) 12/11/2018   Left hand weakness 12/09/2018   Malignant neoplasm of endometrium (HCC) 10/28/2013    ONSET DATE: Jan 25  REFERRING DIAG: Right fourth trigger finger  THERAPY DIAG:  Pain in right hand  Trigger finger, right ring finger  Muscle weakness (generalized)  Rationale for Evaluation and Treatment: Rehabilitation  SUBJECTIVE:   SUBJECTIVE STATEMENT: The radiation treatment was stopped.  I took a few weeks to recover.  But starting to feel better.  I was in [place]  for the treatment.  So my fingers a little bit worse again the pain.  But I try to do the exercises. accompanied by: self  PERTINENT HISTORY: PCP Dr Pinal Note 04/17/23 Chief Complaint  Patient presents with  Finger Pain  Patient complains of ring and small finger of the right hand x 2 months.   SUBJECTIVE: Patient has a trigger ring finger on the right, successfully has had it treated with steroid iontophoresis by physical therapy. Has upcoming scans    PRECAUTIONS: None  WEIGHT BEARING RESTRICTIONS: No  PAIN:  Are you having pain?  Pain decreasing 7/10 pain 4th R A1pulley   FALLS: Has patient fallen in last 6 months? 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  OBJECTIVE:  Note: Objective measures were completed at Evaluation unless otherwise noted.  HAND DOMINANCE: Right  ADLs: I have to pinch everything with my index and middle and thumb.  I cannot grab things -any gripping or lifting or carrying my finger locks  FUNCTIONAL OUTCOME MEASURES: Neck session  UPPER EXTREMITY ROM:     Active ROM Right eval Left eval  Shoulder flexion    Shoulder abduction    Shoulder adduction    Shoulder extension    Shoulder internal rotation    Shoulder external rotation    Elbow flexion    Elbow extension    Wrist flexion    Wrist extension    Wrist ulnar deviation    Wrist radial deviation    Wrist pronation    Wrist supination    (Blank rows = not tested)  Active ROM Right eval Left eval R 07/31/23 R 08/03/23 R 09/17/23  Thumb MCP (0-60)  Thumb IP (0-80)       Thumb Radial abd/add (0-55)        Thumb Palmar abd/add (0-45)        Thumb Opposition to Small Finger        Index MCP (0-90) 80       Index PIP (0-100) 100       Index DIP (0-70)         Long MCP (0-90) 90        Long PIP (0-100) 100        Long DIP (0-70)         Ring MCP (0-90) 80    P 90 P90 P90  Ring PIP (0-100)  75   P80 P90 P90  Ring DIP (0-70)     P45 P55 P60  Little MCP (0-90) 90        Little PIP (0-100)  95       Little DIP (0-70)         (Blank rows = not tested)    HAND FUNCTION: Grip strength: Right: 40 lbs; Left: 65 lbs, Lateral pinch: Right: 17 lbs, Left: 18 lbs, and 3 point pinch: Right: 15 lbs, Left: 15 lbs pain with gripping of  R hand 09/17/23 Grip strength: Right: 30 lbs; Left: 65 lbs, Lateral pinch: Right: 11 lbs, Left: 18 lbs, and 3 point pinch: Right: 14 lbs, Left: 15 lbs pain with gripping of R hand  COORDINATION: Patient compensating using only 3-point pinch avoiding flexion of the fourth digit  SENSATION: Did not any sensory issues  EDEMA: Edema over the fourth A1 pulley on the right  COGNITION: Overall cognitive status: Within functional limits for tasks assessed  TREATMENT DATE: 09/17/23        Patient return after being seen the last time 08/23/2023 patient patient was in [place]  for cancer treatment.  Patient was doing great with decreasing pain and increasing motion prior to leaving for treatment. Patient returned this date with tenderness and pain in the right fourth digit increasing to 7/10.  But was able to maintain her passive range of motion. Grip and prehension strength decreased because of pain.  Contrast: Pt seen for use of contrast to right hand and digits to decrease edema, pain and increase tissue mobility, performed prior to range of motion exercises.     Manual therapy:  Pt seen for therapist directed manual soft tissue massage to right hand to mobilize tissue, promote lymphatic drainage and decrease adhesions.  Use of Graston tool nr 2 for sweeping and brushing over volar 4th digit prior to performing ROM exercises.     Therapeutic Exercises:                                                                                                           Passive range of motion to fourth MC with PIP extention 10 reps Passive range of motion of her DIP/PIP for flexion, intrinsics Composite PROM light pain free range, 10 reps- stop when feeling pull  Cues for exercises and reminder  for no AROM for flexion at this point to avoid triggering.   NO AROM recommended to avoid triggering and while decreasing inflammation to the area.  Modalities: Iontophoresis:  Type: Dexamethasone Location: R 4th  A1pulley Dose: med patch at current 1.8-2.0 , kept hand flat to keep patch in contact to tolerate intensity and able to tolerate ionto more today than last couple of sessions. Time: 21 min Skin check performed prior to ionto with no issues and pt to keep patch on for hour afterwards    PATIENT EDUCATION: Education details: findings of eval and HEP /splint wearing and iontophoresis Person educated: Patient Education method: Explanation, Demonstration, Tactile cues, Verbal cues, and Handouts Education comprehension: verbalized understanding, returned demonstration, verbal cues required, and needs further education    GOALS: Goals reviewed with patient? Yes  LONG TERM GOALS: Target date: 12 wks  Patient to be independent in home program for modalities, passive range of motion and modifications to decrease pain to less than 2/10 Baseline: Tenderness over fourth A1 pulley 8/10 with triggering every attempt of gripping or flexion-no knowledge of home program Goal status: INITIAL  2.  Pain and tenderness over the right fourth digit decreased to less than 1/10 for patient flexion of fourth digit improving to touching palm with no triggering Baseline: Decreased flexion of fourth digit 80 MC flexion PIP 75.  With 8/10 pain. Goal status: INITIAL  3.  Flexion of right digits improved for patient to use in ADLs and IADLs with less than 2 triggers today Baseline: Every attempt of making a fist or flexion of fourth digit on the right patient's finger triggering pain and tenderness 8/10 patient avoiding composite grip and using 2 and 3-point pinch Goal status: INITIAL  4.  Grip strength improved in right hand with 10 pounds with no triggering for patient to return to prior level of function Baseline: Triggering every attempt of flexion.  8/10 pain over the A1 pulley of the fourth digit.  Grip on the right 40 pounds with pain left 65 pounds. Goal status: INITIAL  ASSESSMENT:  CLINICAL  IMPRESSION: Patient seen for OT  for right fourth digit trigger finger since January 25.  Patient was seen 2 years ago by this OT for bilateral thumb triggering, with great improvement.  Patient referred by her PCP.  Patient with 8/10 pain over her right fourth A1 pulley at eval.  Report triggering every attempt of gripping or composite flexion.  Decreased MCP flexion of 80 and PIP 75 on the right fourth digit.  Grip decreased by 25 pounds compared to the left with 8/10 pain at eval. NOW patient has been showing progress but then had to stop treatment on 7/11/17/2023 to have cancer treatment in [place] .  Patient return with increased pain at the right fourth digit as well as A1 pulley to 08/23/2023 but was able to maintain her progress with her passive range of motion.  Appear patient was doing also active motion.  Reinforced with patient again no active range of motion and passive range of motion in a pain-free range.  Initiated iontophoresis with dexamethasone again as well as reviewed with patient again contrast, soft tissue and passive range of motion.  Reinforced with patient again to wear her splint when performing active tasks to help support finger to avoid triggering. Patient limited in functional use of right dominant hand in ADLs and IADLs.  Patient can benefit from skilled OT services to decrease pain and edema increase motion and increase strength to return to  prior level of function.  PERFORMANCE DEFICITS: in functional skills including ADLs, IADLs, ROM, strength, pain, flexibility, decreased knowledge of use of DME, and UE functional use,   and psychosocial skills including environmental adaptation and routines and behaviors.   IMPAIRMENTS: are limiting patient from ADLs, IADLs, rest and sleep, play, leisure, and social participation.   COMORBIDITIES: has no other co-morbidities that affects occupational performance. Patient will benefit from skilled OT to address above impairments and improve  overall function.  MODIFICATION OR ASSISTANCE TO COMPLETE EVALUATION: No modification of tasks or assist necessary to complete an evaluation.  OT OCCUPATIONAL PROFILE AND HISTORY: Problem focused assessment: Including review of records relating to presenting problem.  CLINICAL DECISION MAKING: LOW - limited treatment options, no task modification necessary  REHAB POTENTIAL: Good for goals  EVALUATION COMPLEXITY: Low  PLAN:  OT FREQUENCY: 2x/week  OT DURATION: 12 wks  PLANNED INTERVENTIONS: 97168 OT Re-evaluation, 97535 self care/ADL training, 02889 therapeutic exercise, 97530 therapeutic activity, 97140 manual therapy, 97035 ultrasound, 97039 fluidotherapy, 97034 contrast bath, 97033 iontophoresis, 97760 Orthotic Initial, [id number] Orthotic/Prosthetic subsequent, passive range of motion, patient/family education, and DME and/or AE instructions  CONSULTED AND AGREED WITH PLAN OF CARE: Patient   Ancel Peters, OTR/L,CLT 09/17/2023, 5:02 PM

## 2023-09-20 ENCOUNTER — Ambulatory Visit: Admitting: Occupational Therapy

## 2023-09-20 DIAGNOSIS — M79641 Pain in right hand: Secondary | ICD-10-CM | POA: Diagnosis not present

## 2023-09-20 DIAGNOSIS — M65341 Trigger finger, right ring finger: Secondary | ICD-10-CM

## 2023-09-20 DIAGNOSIS — M6281 Muscle weakness (generalized): Secondary | ICD-10-CM

## 2023-09-20 NOTE — Therapy (Signed)
 OUTPATIENT OCCUPATIONAL THERAPY ORTHO TREATMENT  [name] MRN: [id number] DOB:[birth year], 63 y.o., female Today's Date: 09/20/2023  PCP: Dr Oneil Pinal REFERRING PROVIDER: Dr Oneil Pinal  END OF SESSION:  OT End of Session - 09/20/23 1213     Visit Number 9    Number of Visits 12    Date for OT Re-Evaluation 10/15/23    OT Start Time 1212    OT Stop Time 1301    OT Time Calculation (min) 49 min    Activity Tolerance Patient tolerated treatment well    Behavior During Therapy WFL for tasks assessed/performed          Past Medical History:  Diagnosis Date   B12 deficiency    Endometrial cancer (HCC)    GERD (gastroesophageal reflux disease)    No past surgical history on file. 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) 12/11/2018   Left hand weakness 12/09/2018   Malignant neoplasm of endometrium (HCC) 10/28/2013    ONSET DATE: Jan 25  REFERRING DIAG: Right fourth trigger finger  THERAPY DIAG:  Pain in right hand  Trigger finger, right ring finger  Muscle weakness (generalized)  Rationale for Evaluation and Treatment: Rehabilitation  SUBJECTIVE:   SUBJECTIVE STATEMENT: I done my motion but not the whole finger down to palm -and not by itself - think it is little better the pain  accompanied by: self  PERTINENT HISTORY: PCP Dr Pinal Note 04/17/23 Chief Complaint  Patient presents with  Finger Pain  Patient complains of ring and small finger of the right hand x 2 months.   SUBJECTIVE: Patient has a trigger ring finger on the right, successfully has had it treated with steroid iontophoresis by physical therapy. Has upcoming scans   PRECAUTIONS: None  WEIGHT BEARING RESTRICTIONS: No  PAIN:  Are you having pain?  Pain decreasing  5-6/10 pain 4th R A1pulley   FALLS: Has patient fallen in last 6 months? 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  OBJECTIVE:  Note: Objective measures were completed at Evaluation unless otherwise noted.  HAND DOMINANCE: Right  ADLs: I have to pinch everything with my index and middle and thumb.  I cannot grab things -any gripping or lifting or carrying my finger locks  FUNCTIONAL OUTCOME MEASURES: Neck session  UPPER EXTREMITY ROM:     Active ROM Right eval Left eval  Shoulder flexion    Shoulder abduction    Shoulder adduction    Shoulder extension    Shoulder internal rotation    Shoulder external rotation    Elbow flexion    Elbow extension    Wrist flexion    Wrist extension    Wrist ulnar deviation    Wrist radial deviation    Wrist pronation    Wrist supination    (Blank rows = not tested)  Active ROM Right eval Left eval R 07/31/23 R 08/03/23 R 09/17/23  Thumb MCP (0-60)       Thumb IP (0-80)       Thumb Radial abd/add (0-55)  Thumb Palmar abd/add (0-45)        Thumb Opposition to Small Finger        Index MCP (0-90) 80       Index PIP (0-100) 100       Index DIP (0-70)         Long MCP (0-90) 90        Long PIP (0-100) 100        Long DIP (0-70)         Ring MCP (0-90) 80    P 90 P90 P90  Ring PIP (0-100)  75   P80 P90 P90  Ring DIP (0-70)     P45 P55 P60  Little MCP (0-90) 90        Little PIP (0-100)  95       Little DIP (0-70)         (Blank rows = not tested)    HAND FUNCTION: Grip strength: Right: 40 lbs; Left: 65 lbs, Lateral pinch: Right: 17 lbs, Left: 18 lbs, and 3 point pinch: Right: 15 lbs, Left: 15 lbs pain with gripping of R hand 09/17/23 Grip strength: Right: 30 lbs; Left: 65 lbs, Lateral pinch: Right: 11 lbs, Left: 18 lbs,  and 3 point pinch: Right: 14 lbs, Left: 15 lbs pain with gripping of R hand  COORDINATION: Patient compensating using only 3-point pinch avoiding flexion of the fourth digit  SENSATION: Did not any sensory issues  EDEMA: Edema over the fourth A1 pulley on the right  COGNITION: Overall cognitive status: Within functional limits for tasks assessed  TREATMENT DATE: 09/20/23         Tenderness and pain in the right fourth digit increasing to 5-6/10.  But was able to maintain her passive range of motion from before cancer treatment   Contrast: Pt seen for use of contrast to right hand and digits to decrease edema, pain and increase tissue mobility, performed prior to range of motion exercises.     Manual therapy:  Pt seen for therapist directed manual soft tissue massage to right hand to mobilize tissue, promote lymphatic drainage and decrease adhesions.  Use of Graston tool nr 2 for sweeping and brushing over volar 4th digit prior to performing ROM exercises. Graston tool nr 6 done sweeping over volar forearm - increase tightness in proximal forearm flexors  Add for pt to do at home rolling over red foam roller for soft tissue mobs prior to forearm stretch - ext arm and in supination  5 reps hold 5 sec    Therapeutic Exercises:                                                                                                           Passive range of motion to fourth MC with PIP extention 10 reps Passive range of motion of her DIP/PIP for flexion, intrinsics pain free at PIP - REINFORCE Composite PROM light pain free range, 10 reps- stop when feeling pull only in clinic Cues for exercises and reminder for no AROM for  flexion at this point to avoid triggering.   NO AROM recommended to avoid triggering and while decreasing inflammation to the area.  Modalities: Iontophoresis:  Type: Dexamethasone Location: R 4th A1pulley Dose: med patch at current 1.9 , kept hand flat to keep patch in  contact to tolerate intensity and able to tolerate ionto  Time: 20 min Skin check performed prior to ionto with no issues and pt to keep patch on for hour afterwards    PATIENT EDUCATION: Education details: findings of eval and HEP /splint wearing and iontophoresis Person educated: Patient Education method: Explanation, Demonstration, Tactile cues, Verbal cues, and Handouts Education comprehension: verbalized understanding, returned demonstration, verbal cues required, and needs further education    GOALS: Goals reviewed with patient? Yes  LONG TERM GOALS: Target date: 12 wks  Patient to be independent in home program for modalities, passive range of motion and modifications to decrease pain to less than 2/10 Baseline: Tenderness over fourth A1 pulley 8/10 with triggering every attempt of gripping or flexion-no knowledge of home program Goal status: INITIAL  2.  Pain and tenderness over the right fourth digit decreased to less than 1/10 for patient flexion of fourth digit improving to touching palm with no triggering Baseline: Decreased flexion of fourth digit 80 MC flexion PIP 75.  With 8/10 pain. Goal status: INITIAL  3.  Flexion of right digits improved for patient to use in ADLs and IADLs with less than 2 triggers today Baseline: Every attempt of making a fist or flexion of fourth digit on the right patient's finger triggering pain and tenderness 8/10 patient avoiding composite grip and using 2 and 3-point pinch Goal status: INITIAL  4.  Grip strength improved in right hand with 10 pounds with no triggering for patient to return to prior level of function Baseline: Triggering every attempt of flexion.  8/10 pain over the A1 pulley of the fourth digit.  Grip on the right 40 pounds with pain left 65 pounds. Goal status: INITIAL  ASSESSMENT:  CLINICAL IMPRESSION: Patient seen for OT  for right fourth digit trigger finger since January 25.  Patient was seen 2 years ago by this OT  for bilateral thumb triggering, with great improvement.  Patient referred by her PCP.  Patient with 8/10 pain over her right fourth A1 pulley at eval.  Report triggering every attempt of gripping or composite flexion.  Decreased MCP flexion of 80 and PIP 75 on the right fourth digit.  Grip decreased by 25 pounds compared to the left with 8/10 pain at eval. NOW cont to have 5-6/10 tenderness over A1pulley R 4th - .  Reinforced with patient again no active range of motion and passive range of motion in a pain-free range for DIP and Intrinsic only - not composite.  Initiated iontophoresis with dexamethasone again as well as reviewed with patient again contrast, soft tissue and passive range of motion for digit and forearm.  Reinforced with patient again to wear her splint when performing active tasks to help support finger to avoid triggering. Patient limited in functional use of right dominant hand in ADLs and IADLs.  Patient can benefit from skilled OT services to decrease pain and edema increase motion and increase strength to return to prior level of function.  PERFORMANCE DEFICITS: in functional skills including ADLs, IADLs, ROM, strength, pain, flexibility, decreased knowledge of use of DME, and UE functional use,   and psychosocial skills including environmental adaptation and routines and behaviors.   IMPAIRMENTS: are limiting patient  from ADLs, IADLs, rest and sleep, play, leisure, and social participation.   COMORBIDITIES: has no other co-morbidities that affects occupational performance. Patient will benefit from skilled OT to address above impairments and improve overall function.  MODIFICATION OR ASSISTANCE TO COMPLETE EVALUATION: No modification of tasks or assist necessary to complete an evaluation.  OT OCCUPATIONAL PROFILE AND HISTORY: Problem focused assessment: Including review of records relating to presenting problem.  CLINICAL DECISION MAKING: LOW - limited treatment options, no task  modification necessary  REHAB POTENTIAL: Good for goals  EVALUATION COMPLEXITY: Low  PLAN:  OT FREQUENCY: 2x/week  OT DURATION: 12 wks  PLANNED INTERVENTIONS: 97168 OT Re-evaluation, 97535 self care/ADL training, 02889 therapeutic exercise, 97530 therapeutic activity, 97140 manual therapy, 97035 ultrasound, 97039 fluidotherapy, 97034 contrast bath, 97033 iontophoresis, 97760 Orthotic Initial, [id number] Orthotic/Prosthetic subsequent, passive range of motion, patient/family education, and DME and/or AE instructions  CONSULTED AND AGREED WITH PLAN OF CARE: Patient   Ancel Peters, OTR/L,CLT 09/20/2023, 12:55 PM

## 2023-09-25 ENCOUNTER — Ambulatory Visit: Admitting: Occupational Therapy

## 2023-09-25 DIAGNOSIS — M79641 Pain in right hand: Secondary | ICD-10-CM

## 2023-09-25 DIAGNOSIS — M6281 Muscle weakness (generalized): Secondary | ICD-10-CM

## 2023-09-25 DIAGNOSIS — M65341 Trigger finger, right ring finger: Secondary | ICD-10-CM

## 2023-09-25 NOTE — Therapy (Signed)
 OUTPATIENT OCCUPATIONAL THERAPY ORTHO TREATMENT/RECERT   [name] MRN: [id number] DOB:1960-04-15, 63 y.o., female Today's Date: 09/25/2023  PCP: Dr Oneil Pinal REFERRING PROVIDER: Dr Oneil Pinal  END OF SESSION:  OT End of Session - 09/25/23 0848     Visit Number 10    Number of Visits 20    Date for OT Re-Evaluation 11/20/23    OT Start Time 0816    OT Stop Time 0905    OT Time Calculation (min) 49 min    Activity Tolerance Patient tolerated treatment well    Behavior During Therapy [facility] for tasks assessed/performed          Past Medical History:  Diagnosis Date   B12 deficiency    Endometrial cancer (HCC)    GERD (gastroesophageal reflux disease)    No past surgical history on file. 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) 12/11/2018   Left hand weakness 12/09/2018   Malignant neoplasm of endometrium (HCC) 10/28/2013    ONSET DATE: Jan 25  REFERRING DIAG: Right fourth trigger finger  THERAPY DIAG:  Pain in right hand  Trigger finger, right ring finger  Muscle weakness (generalized)  Rationale for Evaluation and Treatment: Rehabilitation  SUBJECTIVE:   SUBJECTIVE STATEMENT: My finger is little mad -  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   SUBJECTIVE: Patient has a trigger ring finger on the right, successfully has had it treated with steroid iontophoresis by physical  therapy. Has upcoming scans   PRECAUTIONS: None  WEIGHT BEARING RESTRICTIONS: No  PAIN:  Are you having pain?  Pain decreasing 5-6/10 pain 4th R A1pulley   FALLS: Has patient fallen in last 6 months? 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  OBJECTIVE:  Note: Objective measures were completed at Evaluation unless otherwise noted.  HAND DOMINANCE: Right  ADLs: I have to pinch everything with my index and middle and thumb.  I cannot grab things -any gripping or lifting or carrying my finger locks  FUNCTIONAL OUTCOME MEASURES: Neck session  UPPER EXTREMITY ROM:     Active ROM Right eval Left eval  Shoulder flexion    Shoulder abduction    Shoulder adduction    Shoulder extension    Shoulder internal rotation    Shoulder external rotation    Elbow flexion    Elbow extension    Wrist flexion    Wrist extension    Wrist ulnar deviation    Wrist radial deviation    Wrist pronation    Wrist supination    (Blank rows = not tested)  Active ROM Right eval Left eval R 07/31/23 R 08/03/23 R 09/17/23  Thumb MCP (0-60)  Thumb IP (0-80)       Thumb Radial abd/add (0-55)        Thumb Palmar abd/add (0-45)        Thumb Opposition to Small Finger        Index MCP (0-90) 80       Index PIP (0-100) 100       Index DIP (0-70)         Long MCP (0-90) 90        Long PIP (0-100) 100        Long DIP (0-70)         Ring MCP (0-90) 80    P 90 P90 P90  Ring PIP (0-100)  75   P80 P90 P90  Ring DIP (0-70)     P45 P55 P60  Little MCP (0-90) 90        Little PIP (0-100)  95       Little DIP (0-70)         (Blank rows = not tested)    HAND FUNCTION: Grip strength: Right: 40 lbs; Left: 65 lbs, Lateral pinch: Right: 17 lbs, Left: 18 lbs, and 3 point pinch: Right: 15 lbs,  Left: 15 lbs pain with gripping of R hand 09/17/23 Grip strength: Right: 30 lbs; Left: 65 lbs, Lateral pinch: Right: 11 lbs, Left: 18 lbs, and 3 point pinch: Right: 14 lbs, Left: 15 lbs pain with gripping of R hand  COORDINATION: Patient compensating using only 3-point pinch avoiding flexion of the fourth digit  SENSATION: Did not any sensory issues  EDEMA: Edema over the fourth A1 pulley on the right  COGNITION: Overall cognitive status: Within functional limits for tasks assessed  TREATMENT DATE: 09/24/23         Tenderness and pain in the right fourth digit increasing to 5-6/10.   Pt was out of state for Cancer Treatment 3-4 wks  - pain increased   Contrast: Pt seen for use of contrast to right hand and digits to decrease edema, pain and increase tissue mobility, performed prior to range of motion exercises.    8 min - 2 rotation of ice 1 min at time   Manual therapy:  Pt seen for therapist directed manual soft tissue massage to right hand to mobilize tissue, promote lymphatic drainage and decrease adhesions.  Use of Graston tool nr 2 for sweeping and brushing over volar 4th digit prior to performing ROM exercises. Graston tool nr 6 done sweeping over volar forearm - increase tightness in proximal forearm flexors  pt to cont to do at home rolling over red foam roller for soft tissue mobs prior to forearm stretch - ext arm and in supination  5 reps hold 5 sec    Therapeutic Exercises:                                                                                                           Passive range of motion to fourth MC with PIP extention 10 reps Passive range of motion of her DIP/PIP  for flexion, intrinsics pain free at PIP - REINFORCE not to over do Composite PROM light pain free range, 10 reps- stop when feeling pull only in clinic Cues for exercises and reminder for no AROM for flexion at this point to avoid triggering.   NO AROM recommended to avoid triggering and while  decreasing inflammation to the area.  Modalities: Iontophoresis:  Type: Dexamethasone Location: R 4th A1pulley Dose: med patch at current 1.9 , kept hand flat to keep patch in contact to tolerate intensity and able to tolerate ionto  Time: 20 min Skin check performed prior to ionto with no issues and pt to keep patch on for hour afterwards    PATIENT EDUCATION: Education details: findings of eval and HEP /splint wearing and iontophoresis Person educated: Patient Education method: Explanation, Demonstration, Tactile cues, Verbal cues, and Handouts Education comprehension: verbalized understanding, returned demonstration, verbal cues required, and needs further education    GOALS: Goals reviewed with patient? Yes  LONG TERM GOALS: Target date: 8 wks  Patient to be independent in home program for modalities, passive range of motion and modifications to decrease pain to less than 2/10 Baseline: Tenderness over fourth A1 pulley 8/10 with triggering every attempt of gripping or flexion-no knowledge of home program NOW did improve but then missed 3-4 wks because of Cancer Treatment - 5-6/10 pain  Goal status: progressing  2.  Pain and tenderness over the right fourth digit decreased to less than 1/10 for patient flexion of fourth digit improving to touching palm with no triggering Baseline: Decreased flexion of fourth digit 80 MC flexion PIP 75.  With 8/10 pain. NOW pain decrease 5-6/10  PROM 90 MC, 90 PIP and 60DIP since - increase symptoms because of missing OT - had 3-4 wks  cancer treatment Goal status:progressing   3.  Flexion of right digits improved for patient to use in ADLs and IADLs with less than 2 triggers today Baseline: Every attempt of making a fist or flexion of fourth digit on the right patient's finger triggering pain and tenderness 8/10 patient avoiding composite grip and using 2 and 3-point pinch NOW triggering cont if using and pain  5-6/10 was decrease but increase  since missed OT 3-4 wks with cancer treatment  Goal status: progressing  4.  Grip strength improved in right hand with 10 pounds with no triggering for patient to return to prior level of function Baseline: Triggering every attempt of flexion.  8/10 pain over the A1 pulley of the fourth digit.  Grip on the right 40 pounds with pain left 65 pounds. NOW improved but then missed 3-4 wks because of cancer treatmtnet Goal status: progressing   ASSESSMENT:  CLINICAL IMPRESSION: Patient seen for OT  for right fourth digit trigger finger since January 25.  Patient was seen 2 years ago by this OT for bilateral thumb triggering, with great improvement.  Patient referred by her PCP.  Patient with 8/10 pain over her right fourth A1 pulley at eval.  Report triggering every attempt of gripping or composite flexion.  Decreased MCP flexion of 80 and PIP 75 on the right fourth digit.  Grip decreased by 25 pounds compared to the left with 8/10 pain at eval. NOW pt was making great progress  in pain , ROM and triggering - but missed 3-4 wks of OT receiving cancer treatment she return to OT last week -  5-6/10 tenderness over A1pulley R 4th - Reinforced with patient again no active range of motion and passive  range of motion in a pain-free range for DIP and Intrinsic only - not composite.  Initiatediontophoresis with dexamethasone again - 2nd session today.  Reinforced with patient again to wear her splint when performing active tasks to help support finger to avoid triggering. Patient limited in functional use of right dominant hand in ADLs and IADLs.  Patient can benefit from skilled OT services to decrease pain and edema increase motion and increase strength to return to prior level of function.  PERFORMANCE DEFICITS: in functional skills including ADLs, IADLs, ROM, strength, pain, flexibility, decreased knowledge of use of DME, and UE functional use,   and psychosocial skills including environmental adaptation and  routines and behaviors.   IMPAIRMENTS: are limiting patient from ADLs, IADLs, rest and sleep, play, leisure, and social participation.   COMORBIDITIES: has no other co-morbidities that affects occupational performance. Patient will benefit from skilled OT to address above impairments and improve overall function.  MODIFICATION OR ASSISTANCE TO COMPLETE EVALUATION: No modification of tasks or assist necessary to complete an evaluation.  OT OCCUPATIONAL PROFILE AND HISTORY: Problem focused assessment: Including review of records relating to presenting problem.  CLINICAL DECISION MAKING: LOW - limited treatment options, no task modification necessary  REHAB POTENTIAL: Good for goals  EVALUATION COMPLEXITY: Low  PLAN:  OT FREQUENCY: 1-2x wk   OT DURATION: 8 wks  PLANNED INTERVENTIONS: 97168 OT Re-evaluation, 97535 self care/ADL training, 02889 therapeutic exercise, 97530 therapeutic activity, 97140 manual therapy, 97035 ultrasound, 97039 fluidotherapy, 97034 contrast bath, 97033 iontophoresis, 97760 Orthotic Initial, [id number] Orthotic/Prosthetic subsequent, passive range of motion, patient/family education, and DME and/or AE instructions  CONSULTED AND AGREED WITH PLAN OF CARE: Patient   Ancel Peters, OTR/L,CLT 09/25/2023, 10:15 AM

## 2023-09-26 NOTE — Therapy (Signed)
 OUTPATIENT OCCUPATIONAL THERAPY ORTHO TREATMENT  [name] MRN: [id number] DOB:03/08/1960, 63 y.o., female Today's Date: 09/27/2023  PCP: Dr Oneil Pinal REFERRING PROVIDER: Dr Oneil Pinal  END OF SESSION:  OT End of Session - 09/27/23 0814     Visit Number 11    Number of Visits 20    Date for OT Re-Evaluation 11/20/23    OT Start Time 0814    Activity Tolerance Patient tolerated treatment well    Behavior During Therapy [facility] for tasks assessed/performed           Past Medical History:  Diagnosis Date   B12 deficiency    Endometrial cancer (HCC)    GERD (gastroesophageal reflux disease)    No past surgical history on file. 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  That I could not do for a long long time.  It hurts some but it got a little better.  And have also been wearing my trigger finger splint most of the time.  My pain and triggering is better. accompanied by: self  PERTINENT HISTORY: PCP Dr Pinal Note 04/17/23 Chief Complaint  Patient presents with  Finger Pain  Patient complains of ring and small finger of the right hand x 2 months.   SUBJECTIVE: Patient has a trigger ring finger on the right, successfully has had it treated with steroid iontophoresis by physical therapy. Has upcoming scans   PRECAUTIONS: None  WEIGHT BEARING  RESTRICTIONS: No  PAIN:  Are you having pain?  Pain decreasing 2-4/10 pain 4th R A1pulley   FALLS: Has patient fallen in last 6 months? 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  OBJECTIVE:  Note: Objective measures were completed at Evaluation unless otherwise noted.  HAND DOMINANCE: Right  ADLs: I have to pinch everything with my index and middle and thumb.  I cannot grab things -any gripping or lifting or carrying my finger locks  FUNCTIONAL OUTCOME MEASURES: Neck session  UPPER EXTREMITY ROM:     Active ROM Right eval Left eval  Shoulder flexion    Shoulder abduction    Shoulder adduction    Shoulder extension    Shoulder internal rotation    Shoulder external rotation    Elbow flexion    Elbow extension    Wrist flexion    Wrist extension    Wrist ulnar deviation    Wrist radial deviation    Wrist pronation    Wrist supination    (Blank rows = not tested)  Active ROM Right eval Left eval R 07/31/23 R 08/03/23 R 09/17/23  Thumb MCP (0-60)       Thumb IP (0-80)       Thumb Radial abd/add (  0-55)        Thumb Palmar abd/add (0-45)        Thumb Opposition to Small Finger        Index MCP (0-90) 80       Index PIP (0-100) 100       Index DIP (0-70)         Long MCP (0-90) 90        Long PIP (0-100) 100        Long DIP (0-70)         Ring MCP (0-90) 80    P 90 P90 P90  Ring PIP (0-100)  75   P80 P90 P90  Ring DIP (0-70)     P45 P55 P60  Little MCP (0-90) 90        Little PIP (0-100)  95       Little DIP (0-70)         (Blank rows = not tested)    HAND FUNCTION: Grip strength: Right: 40 lbs; Left: 65 lbs, Lateral pinch: Right: 17 lbs, Left: 18 lbs, and 3 point pinch: Right: 15 lbs, Left: 15 lbs pain with gripping of R hand 09/17/23 Grip strength: Right:  30 lbs; Left: 65 lbs, Lateral pinch: Right: 11 lbs, Left: 18 lbs, and 3 point pinch: Right: 14 lbs, Left: 15 lbs pain with gripping of R hand  COORDINATION: Patient compensating using only 3-point pinch avoiding flexion of the fourth digit  SENSATION: Did not any sensory issues  EDEMA: Edema over the fourth A1 pulley on the right  COGNITION: Overall cognitive status: Within functional limits for tasks assessed  TREATMENT DATE: 09/27/23         Tenderness and pain in the right fourth digit improved since last time 2-4/10   Contrast: Pt seen for use of contrast to right hand and digits to decrease edema, pain and increase tissue mobility, performed prior to range of motion exercises.    8 min - 2 rotation of ice 1 min at time   Manual therapy:  Pt seen for therapist directed manual soft tissue massage to right hand to mobilize tissue, promote lymphatic drainage and decrease adhesions.  Use of Graston tool nr 2 for sweeping and brushing over volar 4th digit prior to performing ROM exercises. Graston tool nr 6 done sweeping over volar forearm - increase tightness in proximal forearm flexors  pt to cont to do at home rolling over red foam roller for soft tissue mobs prior to forearm stretch - ext arm and in supination  5 reps hold 5 sec   Some dry cupping 5 cm cups done 2 at a time over the volar flexors proximal and distal with a light pressure and static with AAROM wrist flexion extension 10 reps.  Repeated 3 times Repeated also same cupping techniques size over the proximal volar flexors of the forearm and palm with AAROM wrist flexion extension and digits flexion and extension 10 repetitions repeated 2 times Decrease tightness and trigger points in forearm and hand   Therapeutic Exercises:  Passive range of motion to fourth [facility] with PIP extention 10 reps Passive range of motion of  her DIP/PIP for flexion, intrinsics pain free at PIP - REINFORCE not to over do Composite PROM light pain free range, 10 reps- stop when feeling pull only in clinic Cues for exercises and reminder for no AROM for flexion at this point to avoid triggering.   NO AROM recommended to avoid triggering and while decreasing inflammation to the area.  Modalities: Iontophoresis:  Type: Dexamethasone Location: R 4th A1pulley Dose: med patch at current 2.0 and decrease to 1.8 -  kept hand flat to keep patch in contact to tolerate intensity and able to tolerate ionto  Time: 20 min Skin check performed prior to ionto with no issues and pt to keep patch on for hour afterwards    PATIENT EDUCATION: Education details: findings of eval and HEP /splint wearing and iontophoresis Person educated: Patient Education method: Explanation, Demonstration, Tactile cues, Verbal cues, and Handouts Education comprehension: verbalized understanding, returned demonstration, verbal cues required, and needs further education    GOALS: Goals reviewed with patient? Yes  LONG TERM GOALS: Target date: 8 wks  Patient to be independent in home program for modalities, passive range of motion and modifications to decrease pain to less than 2/10 Baseline: Tenderness over fourth A1 pulley 8/10 with triggering every attempt of gripping or flexion-no knowledge of home program NOW did improve but then missed 3-4 wks because of Cancer Treatment - 5-6/10 pain  Goal status: progressing  2.  Pain and tenderness over the right fourth digit decreased to less than 1/10 for patient flexion of fourth digit improving to touching palm with no triggering Baseline: Decreased flexion of fourth digit 80 MC flexion PIP 75.  With 8/10 pain. NOW pain decrease 5-6/10  PROM 90 MC, 90 PIP and 60DIP since - increase symptoms because of missing OT - had 3-4 wks  cancer treatment Goal status:progressing   3.  Flexion of right digits improved for  patient to use in ADLs and IADLs with less than 2 triggers today Baseline: Every attempt of making a fist or flexion of fourth digit on the right patient's finger triggering pain and tenderness 8/10 patient avoiding composite grip and using 2 and 3-point pinch NOW triggering cont if using and pain  5-6/10 was decrease but increase since missed OT 3-4 wks with cancer treatment  Goal status: progressing  4.  Grip strength improved in right hand with 10 pounds with no triggering for patient to return to prior level of function Baseline: Triggering every attempt of flexion.  8/10 pain over the A1 pulley of the fourth digit.  Grip on the right 40 pounds with pain left 65 pounds. NOW improved but then missed 3-4 wks because of cancer treatmtnet Goal status: progressing   ASSESSMENT:  CLINICAL IMPRESSION: Patient seen for OT  for right fourth digit trigger finger since January 25.  Patient was seen 2 years ago by this OT for bilateral thumb triggering, with great improvement.  Patient referred by her PCP.  Patient with 8/10 pain over her right fourth A1 pulley at eval.  Report triggering every attempt of gripping or composite flexion.  Decreased MCP flexion of 80 and PIP 75 on the right fourth digit.  Grip decreased by 25 pounds compared to the left with 8/10 pain at eval. NOW pt was making great progress  in pain , ROM and triggering - but missed 3-4 wks of OT receiving cancer treatment she return  to OT 09/17/23 -  5-6/10 tenderness over A1pulley R 4th - Reinforced with patient again no active range of motion and passive range of motion in a pain-free range for DIP and Intrinsic only - not composite. This date decrease pain and tenderness- tolerate PROM better-   iontophoresis with dexamethasone again - 3rd session today.  Reinforced with patient again to wear her splint when performing active tasks to help support finger to avoid triggering. Patient limited in functional use of right dominant hand in ADLs and  IADLs.  Patient can benefit from skilled OT services to decrease pain and edema increase motion and increase strength to return to prior level of function.  PERFORMANCE DEFICITS: in functional skills including ADLs, IADLs, ROM, strength, pain, flexibility, decreased knowledge of use of DME, and UE functional use,   and psychosocial skills including environmental adaptation and routines and behaviors.   IMPAIRMENTS: are limiting patient from ADLs, IADLs, rest and sleep, play, leisure, and social participation.   COMORBIDITIES: has no other co-morbidities that affects occupational performance. Patient will benefit from skilled OT to address above impairments and improve overall function.  MODIFICATION OR ASSISTANCE TO COMPLETE EVALUATION: No modification of tasks or assist necessary to complete an evaluation.  OT OCCUPATIONAL PROFILE AND HISTORY: Problem focused assessment: Including review of records relating to presenting problem.  CLINICAL DECISION MAKING: LOW - limited treatment options, no task modification necessary  REHAB POTENTIAL: Good for goals  EVALUATION COMPLEXITY: Low  PLAN:  OT FREQUENCY: 1-2x wk   OT DURATION: 8 wks  PLANNED INTERVENTIONS: 97168 OT Re-evaluation, 97535 self care/ADL training, 02889 therapeutic exercise, 97530 therapeutic activity, 97140 manual therapy, 97035 ultrasound, 97039 fluidotherapy, 97034 contrast bath, 97033 iontophoresis, 97760 Orthotic Initial, [id number] Orthotic/Prosthetic subsequent, passive range of motion, patient/family education, and DME and/or AE instructions  CONSULTED AND AGREED WITH PLAN OF CARE: Patient   Ancel Peters, OTR/L,CLT 09/27/2023, 8:15 AM

## 2023-09-27 ENCOUNTER — Ambulatory Visit: Admitting: Occupational Therapy

## 2023-09-27 DIAGNOSIS — M79641 Pain in right hand: Secondary | ICD-10-CM

## 2023-09-27 DIAGNOSIS — M65341 Trigger finger, right ring finger: Secondary | ICD-10-CM

## 2023-09-27 DIAGNOSIS — M6281 Muscle weakness (generalized): Secondary | ICD-10-CM

## 2023-09-28 ENCOUNTER — Ambulatory Visit: Admitting: Occupational Therapy

## 2023-10-01 ENCOUNTER — Ambulatory Visit: Admitting: Occupational Therapy

## 2023-10-01 DIAGNOSIS — M79641 Pain in right hand: Secondary | ICD-10-CM

## 2023-10-01 DIAGNOSIS — M65341 Trigger finger, right ring finger: Secondary | ICD-10-CM

## 2023-10-01 DIAGNOSIS — M6281 Muscle weakness (generalized): Secondary | ICD-10-CM

## 2023-10-01 NOTE — Therapy (Signed)
 OUTPATIENT OCCUPATIONAL THERAPY ORTHO TREATMENT  [name] MRN: [id number] DOB:25-Jul-1960, 63 y.o., female Today's Date: 10/01/2023  PCP: Dr Oneil Pinal REFERRING PROVIDER: Dr Oneil Pinal  END OF SESSION:  OT End of Session - 10/01/23 1520     Visit Number 12    Number of Visits 20    Date for OT Re-Evaluation 11/20/23    OT Start Time 1520    OT Stop Time 1610    OT Time Calculation (min) 50 min    Activity Tolerance Patient tolerated treatment well    Behavior During Therapy WFL for tasks assessed/performed           Past Medical History:  Diagnosis Date   B12 deficiency    Endometrial cancer (HCC)    GERD (gastroesophageal reflux disease)    No past surgical history on file. 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) 12/11/2018   Left hand weakness 12/09/2018   Malignant neoplasm of endometrium (HCC) 10/28/2013    ONSET DATE: Jan 25  REFERRING DIAG: Right fourth trigger finger  THERAPY DIAG:  Trigger finger, right ring finger  Pain in right hand  Muscle weakness (generalized)  Rationale for Evaluation and Treatment: Rehabilitation  SUBJECTIVE:   SUBJECTIVE STATEMENT: I brought my splint in for you to clean up and did feel better.  The pain is better.  In the tenderness.  And I have used a lot today accompanied by: self  PERTINENT HISTORY: PCP Dr Pinal Note 04/17/23 Chief Complaint  Patient presents with  Finger Pain  Patient complains of ring and small finger of the right hand x 2 months.   SUBJECTIVE: Patient has a trigger ring finger on the right, successfully has had it treated with steroid iontophoresis by physical therapy. Has upcoming scans   PRECAUTIONS: None  WEIGHT BEARING RESTRICTIONS: No  PAIN:  Are you  having pain?  Pain decreasing 2/10 pain 4th R A1pulley   FALLS: Has patient fallen in last 6 months? 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  OBJECTIVE:  Note: Objective measures were completed at Evaluation unless otherwise noted.  HAND DOMINANCE: Right  ADLs: I have to pinch everything with my index and middle and thumb.  I cannot grab things -any gripping or lifting or carrying my finger locks  FUNCTIONAL OUTCOME MEASURES: Neck session  UPPER EXTREMITY ROM:     Active ROM Right eval Left eval  Shoulder flexion    Shoulder abduction    Shoulder adduction    Shoulder extension    Shoulder internal rotation    Shoulder external rotation    Elbow flexion    Elbow extension    Wrist flexion    Wrist extension    Wrist ulnar deviation    Wrist radial deviation    Wrist pronation    Wrist supination    (Blank rows = not tested)  Active ROM Right eval Left eval R 07/31/23 R 08/03/23 R 09/17/23  Thumb MCP (0-60)       Thumb IP (0-80)       Thumb Radial abd/add (0-55)  Thumb Palmar abd/add (0-45)        Thumb Opposition to Small Finger        Index MCP (0-90) 80       Index PIP (0-100) 100       Index DIP (0-70)         Long MCP (0-90) 90        Long PIP (0-100) 100        Long DIP (0-70)         Ring MCP (0-90) 80    P 90 P90 P90  Ring PIP (0-100)  75   P80 P90 P90  Ring DIP (0-70)     P45 P55 P60  Little MCP (0-90) 90        Little PIP (0-100)  95       Little DIP (0-70)         (Blank rows = not tested)    HAND FUNCTION: Grip strength: Right: 40 lbs; Left: 65 lbs, Lateral pinch: Right: 17 lbs, Left: 18 lbs, and 3 point pinch: Right: 15 lbs, Left: 15 lbs pain with gripping of R hand 09/17/23 Grip strength: Right: 30 lbs; Left: 65 lbs, Lateral pinch:  Right: 11 lbs, Left: 18 lbs, and 3 point pinch: Right: 14 lbs, Left: 15 lbs pain with gripping of R hand  COORDINATION: Patient compensating using only 3-point pinch avoiding flexion of the fourth digit  SENSATION: Did not any sensory issues  EDEMA: Edema over the fourth A1 pulley on the right  COGNITION: Overall cognitive status: Within functional limits for tasks assessed  TREATMENT DATE: 10/01/23         Tenderness and pain in the right fourth digit improved since last time 2/10 Per patient the cupping in the right palm and forearm helped a lot.  Pain is better and tenderness and tightness   Contrast: Pt seen for use of contrast to right hand and digits to decrease edema, pain and increase tissue mobility, performed prior to range of motion exercises.    8 min - 2 rotation of ice 1 min at time   Manual therapy:  Pt seen for therapist directed manual soft tissue massage to right hand to mobilize tissue, promote lymphatic drainage and decrease adhesions.  Use of Graston tool nr 2 for sweeping and brushing over volar 4th digit prior to performing ROM exercises. Graston tool nr 6 done sweeping over volar forearm - increase tightness in proximal forearm flexors  pt to cont to do at home rolling over red foam roller for soft tissue mobs prior to forearm stretch - ext arm and in supination  5 reps hold 5 sec   Some dry cupping 5 cm cups done 2 at a time over the volar flexors proximal  and  volar palm  with a light pressure and static with AAROM wrist flexion extension 10 reps.  And digits extention  Repeated 3 times  Decrease tightness and trigger points in forearm and hand   Changes made with extra moleskin for preventing skin issues on the volar proximal part and volar distal part.  As well as new Velcro. Appear patient was wearing it upside down providing a block on the PIP instead of into the palm. Reviewed with patient again donning and doffing as well as wearing correctly MC  block splint   Therapeutic Exercises:  Passive range of motion to fourth [facility] with PIP extention 10 reps Passive range of motion of her DIP/PIP for flexion, intrinsics pain free at PIP - REINFORCE not to over do Composite PROM light pain free range, 10 reps- stop when feeling pull only in clinic Cues for exercises and reminder for no AROM for flexion at this point to avoid triggering.   NO AROM recommended to avoid triggering and while decreasing inflammation to the area.  Modalities: Iontophoresis:  Type: Dexamethasone Location: R 4th A1pulley Dose: med patch at current 2.0 and decrease to 1.8 -  kept hand flat to keep patch in contact to tolerate intensity and able to tolerate ionto  Time: 20 min Skin check performed prior to ionto with no issues and pt to keep patch on for hour afterwards    PATIENT EDUCATION: Education details: findings of eval and HEP /splint wearing and iontophoresis Person educated: Patient Education method: Explanation, Demonstration, Tactile cues, Verbal cues, and Handouts Education comprehension: verbalized understanding, returned demonstration, verbal cues required, and needs further education    GOALS: Goals reviewed with patient? Yes  LONG TERM GOALS: Target date: 8 wks  Patient to be independent in home program for modalities, passive range of motion and modifications to decrease pain to less than 2/10 Baseline: Tenderness over fourth A1 pulley 8/10 with triggering every attempt of gripping or flexion-no knowledge of home program NOW did improve but then missed 3-4 wks because of Cancer Treatment - 5-6/10 pain  Goal status: progressing  2.  Pain and tenderness over the right fourth digit decreased to less than 1/10 for patient flexion of fourth digit improving to touching palm with no triggering Baseline: Decreased flexion of fourth digit 80 MC  flexion PIP 75.  With 8/10 pain. NOW pain decrease 5-6/10  PROM 90 MC, 90 PIP and 60DIP since - increase symptoms because of missing OT - had 3-4 wks  cancer treatment Goal status:progressing   3.  Flexion of right digits improved for patient to use in ADLs and IADLs with less than 2 triggers today Baseline: Every attempt of making a fist or flexion of fourth digit on the right patient's finger triggering pain and tenderness 8/10 patient avoiding composite grip and using 2 and 3-point pinch NOW triggering cont if using and pain  5-6/10 was decrease but increase since missed OT 3-4 wks with cancer treatment  Goal status: progressing  4.  Grip strength improved in right hand with 10 pounds with no triggering for patient to return to prior level of function Baseline: Triggering every attempt of flexion.  8/10 pain over the A1 pulley of the fourth digit.  Grip on the right 40 pounds with pain left 65 pounds. NOW improved but then missed 3-4 wks because of cancer treatmtnet Goal status: progressing   ASSESSMENT:  CLINICAL IMPRESSION: Patient seen for OT  for right fourth digit trigger finger since January 25.  Patient was seen 2 years ago by this OT for bilateral thumb triggering, with great improvement.  Patient referred by her PCP.  Patient with 8/10 pain over her right fourth A1 pulley at eval.  Report triggering every attempt of gripping or composite flexion.  Decreased MCP flexion of 80 and PIP 75 on the right fourth digit.  Grip decreased by 25 pounds compared to the left with 8/10 pain at eval. NOW pt was making great progress  in pain , ROM and triggering - but missed 3-4 wks of OT receiving cancer treatment she return to  OT 09/17/23 -this day decrease pain and tenderness to 2/10 at the A1 pulley of the right fourth.  Patient also report after doing manual therapy cupping on the volar forearm and palm great improvement in pain and flexibility with less tightness.  Tolerate PROM better-   iontophoresis  with dexamethasone again - 4th session today.  Reinforced with patient again to wear her splint when performing active tasks to help support finger to avoid triggering. Patient limited in functional use of right dominant hand in ADLs and IADLs.  Patient can benefit from skilled OT services to decrease pain and edema increase motion and increase strength to return to prior level of function.  PERFORMANCE DEFICITS: in functional skills including ADLs, IADLs, ROM, strength, pain, flexibility, decreased knowledge of use of DME, and UE functional use,   and psychosocial skills including environmental adaptation and routines and behaviors.   IMPAIRMENTS: are limiting patient from ADLs, IADLs, rest and sleep, play, leisure, and social participation.   COMORBIDITIES: has no other co-morbidities that affects occupational performance. Patient will benefit from skilled OT to address above impairments and improve overall function.  MODIFICATION OR ASSISTANCE TO COMPLETE EVALUATION: No modification of tasks or assist necessary to complete an evaluation.  OT OCCUPATIONAL PROFILE AND HISTORY: Problem focused assessment: Including review of records relating to presenting problem.  CLINICAL DECISION MAKING: LOW - limited treatment options, no task modification necessary  REHAB POTENTIAL: Good for goals  EVALUATION COMPLEXITY: Low  PLAN:  OT FREQUENCY: 1-2x wk   OT DURATION: 8 wks  PLANNED INTERVENTIONS: 97168 OT Re-evaluation, 97535 self care/ADL training, 02889 therapeutic exercise, 97530 therapeutic activity, 97140 manual therapy, 97035 ultrasound, 97039 fluidotherapy, 97034 contrast bath, 97033 iontophoresis, 97760 Orthotic Initial, [id number] Orthotic/Prosthetic subsequent, passive range of motion, patient/family education, and DME and/or AE instructions  CONSULTED AND AGREED WITH PLAN OF CARE: Patient   Ancel Peters, OTR/L,CLT 10/01/2023, 3:59 PM

## 2023-10-08 ENCOUNTER — Ambulatory Visit: Admitting: Occupational Therapy

## 2023-10-09 ENCOUNTER — Ambulatory Visit: Admitting: Occupational Therapy

## 2023-10-09 DIAGNOSIS — M79641 Pain in right hand: Secondary | ICD-10-CM | POA: Diagnosis not present

## 2023-10-09 DIAGNOSIS — M65341 Trigger finger, right ring finger: Secondary | ICD-10-CM

## 2023-10-09 DIAGNOSIS — M65331 Trigger finger, right middle finger: Secondary | ICD-10-CM

## 2023-10-09 DIAGNOSIS — M6281 Muscle weakness (generalized): Secondary | ICD-10-CM

## 2023-10-09 NOTE — Therapy (Signed)
 OUTPATIENT OCCUPATIONAL THERAPY ORTHO TREATMENT  [name] MRN: [id number] DOB:02/13/1961, 63 y.o., female Today's Date: 10/09/2023  PCP: Dr Oneil Pinal REFERRING PROVIDER: Dr Oneil Pinal  END OF SESSION:  OT End of Session - 10/09/23 1541     Visit Number 13    Number of Visits 20    Date for OT Re-Evaluation 11/20/23    OT Start Time 1539    OT Stop Time 1625    OT Time Calculation (min) 46 min    Activity Tolerance Patient tolerated treatment well    Behavior During Therapy WFL for tasks assessed/performed           Past Medical History:  Diagnosis Date   B12 deficiency    Endometrial cancer (HCC)    GERD (gastroesophageal reflux disease)    No past surgical history on file. 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  Also feels her finger is little worse because she was able to only come 1 time last week accompanied by: self  PERTINENT HISTORY: PCP Dr Pinal Note 04/17/23 Chief Complaint  Patient presents with  Finger Pain  Patient complains of ring and small finger of the right hand x 2 months.   SUBJECTIVE: Patient has a trigger ring finger on the right, successfully has had it treated with steroid iontophoresis by physical  therapy. Has upcoming scans   PRECAUTIONS: None  WEIGHT BEARING RESTRICTIONS: No  PAIN:  Are you having pain?  Pain decreasing 2/10 pain 4th R A1pulley   FALLS: Has patient fallen in last 6 months? 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  OBJECTIVE:  Note: Objective measures were completed at Evaluation unless otherwise noted.  HAND DOMINANCE: Right  ADLs: I have to pinch everything with my index and middle and thumb.  I cannot grab things -any gripping or lifting or carrying my finger locks  FUNCTIONAL OUTCOME MEASURES: Neck session  UPPER EXTREMITY ROM:     Active ROM Right eval Left eval  Shoulder flexion    Shoulder abduction    Shoulder adduction    Shoulder extension    Shoulder internal rotation    Shoulder external rotation    Elbow flexion    Elbow extension    Wrist flexion    Wrist extension    Wrist ulnar deviation    Wrist radial deviation    Wrist pronation    Wrist supination    (Blank rows = not tested)  Active ROM Right eval Left eval R 07/31/23 R 08/03/23 R 09/17/23 R  10/09/23  Thumb MCP (0-60)  Thumb IP (0-80)        Thumb Radial abd/add (0-55)         Thumb Palmar abd/add (0-45)         Thumb Opposition to Small Finger         Index MCP (0-90) 80        Index PIP (0-100) 100        Index DIP (0-70)          Long MCP (0-90) 90         Long PIP (0-100) 100         Long DIP (0-70)          Ring MCP (0-90) 80    P 90 P90 P90 P90  Ring PIP (0-100)  75   P80 P90 P90 P90  Ring DIP (0-70)     P45 P55 P60 P65  Little MCP (0-90) 90         Little PIP (0-100)  95        Little DIP (0-70)          (Blank rows = not tested)    HAND FUNCTION: Grip strength: Right: 40 lbs; Left: 65 lbs, Lateral pinch: Right: 17 lbs, Left: 18 lbs,  and 3 point pinch: Right: 15 lbs, Left: 15 lbs pain with gripping of R hand 09/17/23 Grip strength: Right: 30 lbs; Left: 65 lbs, Lateral pinch: Right: 11 lbs, Left: 18 lbs, and 3 point pinch: Right: 14 lbs, Left: 15 lbs pain with gripping of R hand  COORDINATION: Patient compensating using only 3-point pinch avoiding flexion of the fourth digit  SENSATION: Did not any sensory issues  EDEMA: Edema over the fourth A1 pulley on the right  COGNITION: Overall cognitive status: Within functional limits for tasks assessed  TREATMENT DATE: 10/09/23         Tenderness and pain in the right fourth digit improved since last time 2/10 Per patient the cupping in the right palm and forearm helped a lot.  Pain is better and tenderness and tightness   Contrast: Pt seen for use of contrast to bilateral hand and digits to decrease edema, pain and increase tissue mobility, performed prior to range of motion exercises.    8 min - alternating heat 2 min and cold 1 min total of 8 minutes  Manual therapy:  Pt seen for therapist directed manual soft tissue massage to right hand to mobilize tissue, promote lymphatic drainage and decrease adhesions.  Use of Graston tool nr 2 for sweeping and brushing over volar 4th digit prior to performing ROM exercises. Graston tool nr 6 done sweeping over volar forearm - increase tightness in proximal forearm flexors  pt to cont to do at home rolling over red foam roller for soft tissue mobs prior to forearm stretch - ext arm and in supination  5 reps hold 5 sec   Some dry cupping 5 cm cups done 2 at a time over the volar flexors proximal  and  volar palm  with a light pressure and static with AAROM wrist flexion extension 10 reps.  And digits extention  Repeated 3 times  Decrease tightness and trigger points in forearm and hand   Changes made with extra moleskin for preventing skin issues on the volar proximal part and volar distal part.  As well as new Velcro. Appear  patient was wearing it upside down providing a block on the PIP instead of into the palm. Reviewed with patient again  donning and doffing as well as wearing correctly MC block splint   Therapeutic Exercises:                                                                                                           Passive range of motion to fourth MC with PIP extention 10 reps Passive range of motion of her DIP/PIP for flexion, intrinsics pain free at PIP - REINFORCE not to over do Composite PROM light pain free range, 10 reps- stop when feeling pull only in clinic Cues for exercises and reminder for no AROM for flexion at this point to avoid triggering.   NO AROM recommended to avoid triggering and while decreasing inflammation to the area.  Modalities: Iontophoresis:  Type: Dexamethasone Location: R 4th A1pulley Dose: med patch at current 2.0 and decrease to 1.8 -  kept hand flat to keep patch in contact to tolerate intensity and able to tolerate ionto  Time: 20 min Skin check performed prior to ionto with no issues and pt to keep patch on for hour afterwards    PATIENT EDUCATION: Education details: findings of eval and HEP /splint wearing and iontophoresis Person educated: Patient Education method: Explanation, Demonstration, Tactile cues, Verbal cues, and Handouts Education comprehension: verbalized understanding, returned demonstration, verbal cues required, and needs further education    GOALS: Goals reviewed with patient? Yes  LONG TERM GOALS: Target date: 8 wks  Patient to be independent in home program for modalities, passive range of motion and modifications to decrease pain to less than 2/10 Baseline: Tenderness over fourth A1 pulley 8/10 with triggering every attempt of gripping or flexion-no knowledge of home program NOW did improve but then missed 3-4 wks because of Cancer Treatment - 5-6/10 pain  Goal status: progressing  2.  Pain and tenderness over the right fourth  digit decreased to less than 1/10 for patient flexion of fourth digit improving to touching palm with no triggering Baseline: Decreased flexion of fourth digit 80 MC flexion PIP 75.  With 8/10 pain. NOW pain decrease 5-6/10  PROM 90 MC, 90 PIP and 60DIP since - increase symptoms because of missing OT - had 3-4 wks  cancer treatment Goal status:progressing   3.  Flexion of right digits improved for patient to use in ADLs and IADLs with less than 2 triggers today Baseline: Every attempt of making a fist or flexion of fourth digit on the right patient's finger triggering pain and tenderness 8/10 patient avoiding composite grip and using 2 and 3-point pinch NOW triggering cont if using and pain  5-6/10 was decrease but increase since missed OT 3-4 wks with cancer treatment  Goal status: progressing  4.  Grip strength improved in right hand with 10 pounds with no triggering for patient to return to prior level of function Baseline: Triggering every attempt of flexion.  8/10 pain over the A1 pulley of the fourth digit.  Grip on the right 40 pounds with pain left 65 pounds. NOW improved but then missed 3-4 wks because of cancer treatmtnet Goal status: progressing  ASSESSMENT:  CLINICAL IMPRESSION: Patient seen for OT  for right fourth digit trigger finger since January 25.  Patient was seen 2 years ago by this OT for bilateral thumb triggering, with great improvement.  Patient referred by her PCP.  Patient with 8/10 pain over her right fourth A1 pulley at eval.  Report triggering every attempt of gripping or composite flexion.  Decreased MCP flexion of 80 and PIP 75 on the right fourth digit.  Grip decreased by 25 pounds compared to the left with 8/10 pain at eval. NOW pt was making great progress  in pain , ROM and triggering - but missed 3-4 wks of OT receiving cancer treatment she return to OT 09/17/23 - cot to show decrease pain and tenderness to 2/10 at the A1 pulley of the right fourth.  Patient also  report after doing manual therapy cupping on the volar forearm and palm great improvement in pain and flexibility with less tightness.  Tolerate PROM better-   iontophoresis with dexamethasone again-  Reinforced with patient again to wear her splint when performing active tasks to help support finger to avoid triggering. Patient limited in functional use of right dominant hand in ADLs and IADLs.  Patient can benefit from skilled OT services to decrease pain and edema increase motion and increase strength to return to prior level of function.  PERFORMANCE DEFICITS: in functional skills including ADLs, IADLs, ROM, strength, pain, flexibility, decreased knowledge of use of DME, and UE functional use,   and psychosocial skills including environmental adaptation and routines and behaviors.   IMPAIRMENTS: are limiting patient from ADLs, IADLs, rest and sleep, play, leisure, and social participation.   COMORBIDITIES: has no other co-morbidities that affects occupational performance. Patient will benefit from skilled OT to address above impairments and improve overall function.  MODIFICATION OR ASSISTANCE TO COMPLETE EVALUATION: No modification of tasks or assist necessary to complete an evaluation.  OT OCCUPATIONAL PROFILE AND HISTORY: Problem focused assessment: Including review of records relating to presenting problem.  CLINICAL DECISION MAKING: LOW - limited treatment options, no task modification necessary  REHAB POTENTIAL: Good for goals  EVALUATION COMPLEXITY: Low  PLAN:  OT FREQUENCY: 1-2x wk   OT DURATION: 8 wks  PLANNED INTERVENTIONS: 97168 OT Re-evaluation, 97535 self care/ADL training, 02889 therapeutic exercise, 97530 therapeutic activity, 97140 manual therapy, 97035 ultrasound, 97039 fluidotherapy, 97034 contrast bath, 97033 iontophoresis, 97760 Orthotic Initial, [id number] Orthotic/Prosthetic subsequent, passive range of motion, patient/family education, and DME and/or AE  instructions  CONSULTED AND AGREED WITH PLAN OF CARE: Patient   Ancel Peters, OTR/L,CLT 10/09/2023, 5:26 PM

## 2023-10-11 ENCOUNTER — Ambulatory Visit: Admitting: Occupational Therapy

## 2023-10-11 DIAGNOSIS — M6281 Muscle weakness (generalized): Secondary | ICD-10-CM

## 2023-10-11 DIAGNOSIS — M65341 Trigger finger, right ring finger: Secondary | ICD-10-CM

## 2023-10-11 DIAGNOSIS — M79641 Pain in right hand: Secondary | ICD-10-CM

## 2023-10-11 DIAGNOSIS — M65331 Trigger finger, right middle finger: Secondary | ICD-10-CM

## 2023-10-11 NOTE — Therapy (Signed)
 OUTPATIENT OCCUPATIONAL THERAPY ORTHO TREATMENT  [name] MRN: [id number] DOB:[birth year], 63 y.o., female Today's Date: 10/11/2023  PCP: Dr Oneil Pinal REFERRING PROVIDER: Dr Oneil Pinal  END OF SESSION:  OT End of Session - 10/11/23 1610     Visit Number 14    Number of Visits 20    Date for OT Re-Evaluation 11/20/23    OT Start Time 1610    OT Stop Time 1655    OT Time Calculation (min) 45 min    Activity Tolerance Patient tolerated treatment well    Behavior During Therapy WFL for tasks assessed/performed           Past Medical History:  Diagnosis Date   B12 deficiency    Endometrial cancer (HCC)    GERD (gastroesophageal reflux disease)    No past surgical history on file. 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) 12/11/2018   Left hand weakness 12/09/2018   Malignant neoplasm of endometrium (HCC) 10/28/2013    ONSET DATE: Jan 25  REFERRING DIAG: Right fourth trigger finger  THERAPY DIAG:  Trigger finger, right ring finger  Trigger finger, right middle finger  Pain in right hand  Muscle weakness (generalized)  Rationale for Evaluation and Treatment: Rehabilitation  SUBJECTIVE:   SUBJECTIVE STATEMENT: Thinners and pain is better.  Is mostly in the joint when I do the motion.  I do that several times during the day.   Accompanied by: self  PERTINENT HISTORY: PCP Dr Pinal Note 04/17/23 Chief Complaint  Patient presents with  Finger Pain  Patient complains of ring and small finger of the right hand x 2 months.   SUBJECTIVE: Patient has a trigger ring finger on the right, successfully has had it treated with steroid iontophoresis by physical therapy. Has upcoming scans   PRECAUTIONS: None  WEIGHT BEARING RESTRICTIONS:  No  PAIN:  Are you having pain?  Pain decreasing 1/10 pain 4th R A1pulley   FALLS: Has patient fallen in last 6 months? 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  OBJECTIVE:  Note: Objective measures were completed at Evaluation unless otherwise noted.  HAND DOMINANCE: Right  ADLs: I have to pinch everything with my index and middle and thumb.  I cannot grab things -any gripping or lifting or carrying my finger locks  FUNCTIONAL OUTCOME MEASURES: Neck session  UPPER EXTREMITY ROM:     Active ROM Right eval Left eval  Shoulder flexion    Shoulder abduction    Shoulder adduction    Shoulder extension    Shoulder internal rotation    Shoulder external rotation    Elbow flexion    Elbow extension    Wrist flexion    Wrist extension    Wrist ulnar deviation    Wrist radial deviation    Wrist pronation    Wrist supination    (Blank rows = not tested)  Active ROM Right eval Left eval R 07/31/23 R 08/03/23 R 09/17/23 R  10/09/23  Thumb MCP (0-60)        Thumb IP (0-80)  Thumb Radial abd/add (0-55)         Thumb Palmar abd/add (0-45)         Thumb Opposition to Small Finger         Index MCP (0-90) 80        Index PIP (0-100) 100        Index DIP (0-70)          Long MCP (0-90) 90         Long PIP (0-100) 100         Long DIP (0-70)          Ring MCP (0-90) 80    P 90 P90 P90 P90  Ring PIP (0-100)  75   P80 P90 P90 P90  Ring DIP (0-70)     P45 P55 P60 P65  Little MCP (0-90) 90         Little PIP (0-100)  95        Little DIP (0-70)          (Blank rows = not tested)    HAND FUNCTION: Grip strength: Right: 40 lbs; Left: 65 lbs, Lateral pinch: Right: 17 lbs, Left: 18 lbs, and 3 point pinch: Right: 15 lbs, Left: 15 lbs pain with gripping of R hand 09/17/23  Grip strength: Right: 30 lbs; Left: 65 lbs, Lateral pinch: Right: 11 lbs, Left: 18 lbs, and 3 point pinch: Right: 14 lbs, Left: 15 lbs pain with gripping of R hand  COORDINATION: Patient compensating using only 3-point pinch avoiding flexion of the fourth digit  SENSATION: Did not any sensory issues  EDEMA: Edema over the fourth A1 pulley on the right  COGNITION: Overall cognitive status: Within functional limits for tasks assessed  TREATMENT DATE: 10/11/23         Tenderness and pain in the right fourth digit improved since last time 1/10 Per patient the cupping in the right palm and forearm helped a lot.  Pain is better and tenderness and tightness   Contrast: Pt seen for use of contrast to bilateral hand and digits to decrease edema, pain and increase tissue mobility, performed prior to range of motion exercises.    8 min - alternating heat 2 min and cold 1 min total of 8 minutes  Manual therapy:  Pt seen for therapist directed manual soft tissue massage to right hand to mobilize tissue, promote lymphatic drainage and decrease adhesions.  Use of Graston tool nr 2 for sweeping and brushing over volar 4th digit prior to performing ROM exercises. Graston tool nr 6 done sweeping over volar forearm - increase tightness in proximal forearm flexors  pt to cont to do at home rolling over red foam roller for soft tissue mobs prior to forearm stretch - ext arm and in supination  5 reps hold 5 sec   Some dry cupping 5 cm cups done 2 at a time over the dorsal extensors on forearm  proximal  and middle as well as volar palm  with a light pressure and static with AAROM wrist flexion extension 10 reps.  And digits extention   Also done PROM composite flexion wrist and digits Repeated 3 times  Decrease tightness and trigger points in forearm and hand   Changes made with extra moleskin for preventing skin issues on the volar proximal part and volar distal part.  As well as new Velcro. Appear  patient was wearing it upside down providing a block on the PIP instead of into the  palm. Reviewed with patient again donning and doffing as well as wearing correctly MC block splint   Therapeutic Exercises:                                                                                                           Passive range of motion to fourth MC with PIP extention 10 reps Passive range of motion of her DIP/PIP for flexion, intrinsics pain free at PIP - REINFORCE not to over do Composite PROM light pain free range, 10 reps- stop when feeling pull only in clinic Cues for exercises and reminder for no AROM for flexion at this point to avoid triggering.   NO AROM recommended to avoid triggering and while decreasing inflammation to the area.  Modalities: Iontophoresis:  Type: Dexamethasone Location: R 4th A1pulley Dose: med patch at current 2.0 -  kept hand flat to keep patch in contact to tolerate intensity and able to tolerate ionto  Time: 20 min Skin check performed prior to ionto with no issues and pt to keep patch on for hour afterwards    PATIENT EDUCATION: Education details: findings of eval and HEP /splint wearing and iontophoresis Person educated: Patient Education method: Explanation, Demonstration, Tactile cues, Verbal cues, and Handouts Education comprehension: verbalized understanding, returned demonstration, verbal cues required, and needs further education    GOALS: Goals reviewed with patient? Yes  LONG TERM GOALS: Target date: 8 wks  Patient to be independent in home program for modalities, passive range of motion and modifications to decrease pain to less than 2/10 Baseline: Tenderness over fourth A1 pulley 8/10 with triggering every attempt of gripping or flexion-no knowledge of home program NOW did improve but then missed 3-4 wks because of Cancer Treatment - 5-6/10 pain  Goal status: progressing  2.  Pain and tenderness over the right fourth digit decreased to  less than 1/10 for patient flexion of fourth digit improving to touching palm with no triggering Baseline: Decreased flexion of fourth digit 80 MC flexion PIP 75.  With 8/10 pain. NOW pain decrease 5-6/10  PROM 90 MC, 90 PIP and 60DIP since - increase symptoms because of missing OT - had 3-4 wks  cancer treatment Goal status:progressing   3.  Flexion of right digits improved for patient to use in ADLs and IADLs with less than 2 triggers today Baseline: Every attempt of making a fist or flexion of fourth digit on the right patient's finger triggering pain and tenderness 8/10 patient avoiding composite grip and using 2 and 3-point pinch NOW triggering cont if using and pain  5-6/10 was decrease but increase since missed OT 3-4 wks with cancer treatment  Goal status: progressing  4.  Grip strength improved in right hand with 10 pounds with no triggering for patient to return to prior level of function Baseline: Triggering every attempt of flexion.  8/10 pain over the A1 pulley of the fourth digit.  Grip on the right 40 pounds with pain left 65 pounds. NOW improved but then missed 3-4 wks because of cancer treatmtnet Goal status:  progressing   ASSESSMENT:  CLINICAL IMPRESSION: Patient seen for OT  for right fourth digit trigger finger since January 25.  Patient was seen 2 years ago by this OT for bilateral thumb triggering, with great improvement.  Patient referred by her PCP.  Patient with 8/10 pain over her right fourth A1 pulley at eval.  Report triggering every attempt of gripping or composite flexion.  Decreased MCP flexion of 80 and PIP 75 on the right fourth digit.  Grip decreased by 25 pounds compared to the left with 8/10 pain at eval. NOW pt was making great progress  in pain , ROM and triggering - but missed 3-4 wks of OT receiving cancer treatment she return to OT 09/17/23 - cot to show decrease pain and tenderness to 1/10 at the A1 pulley of the right fourth.  Patient also report after doing  manual therapy cupping on the volar forearm and palm great improvement in pain and flexibility with less tightness. Done dorsal forearm with composite flexion today -   Tolerate PROM better-   iontophoresis with dexamethasone again-  Reinforced with patient again to wear her splint when performing active tasks to help support finger to avoid triggering. Patient limited in functional use of right dominant hand in ADLs and IADLs.  Patient can benefit from skilled OT services to decrease pain and edema increase motion and increase strength to return to prior level of function.  PERFORMANCE DEFICITS: in functional skills including ADLs, IADLs, ROM, strength, pain, flexibility, decreased knowledge of use of DME, and UE functional use,   and psychosocial skills including environmental adaptation and routines and behaviors.   IMPAIRMENTS: are limiting patient from ADLs, IADLs, rest and sleep, play, leisure, and social participation.   COMORBIDITIES: has no other co-morbidities that affects occupational performance. Patient will benefit from skilled OT to address above impairments and improve overall function.  MODIFICATION OR ASSISTANCE TO COMPLETE EVALUATION: No modification of tasks or assist necessary to complete an evaluation.  OT OCCUPATIONAL PROFILE AND HISTORY: Problem focused assessment: Including review of records relating to presenting problem.  CLINICAL DECISION MAKING: LOW - limited treatment options, no task modification necessary  REHAB POTENTIAL: Good for goals  EVALUATION COMPLEXITY: Low  PLAN:  OT FREQUENCY: 1-2x wk   OT DURATION: 8 wks  PLANNED INTERVENTIONS: 97168 OT Re-evaluation, 97535 self care/ADL training, 02889 therapeutic exercise, 97530 therapeutic activity, 97140 manual therapy, 97035 ultrasound, 97039 fluidotherapy, 97034 contrast bath, 97033 iontophoresis, 97760 Orthotic Initial, [id number] Orthotic/Prosthetic subsequent, passive range of motion, patient/family education,  and DME and/or AE instructions  CONSULTED AND AGREED WITH PLAN OF CARE: Patient   Ancel Peters, OTR/L,CLT 10/11/2023, 4:47 PM

## 2023-10-16 ENCOUNTER — Ambulatory Visit: Attending: Internal Medicine | Admitting: Occupational Therapy

## 2023-10-16 DIAGNOSIS — M6281 Muscle weakness (generalized): Secondary | ICD-10-CM | POA: Diagnosis present

## 2023-10-16 DIAGNOSIS — M79641 Pain in right hand: Secondary | ICD-10-CM | POA: Insufficient documentation

## 2023-10-16 DIAGNOSIS — M65331 Trigger finger, right middle finger: Secondary | ICD-10-CM | POA: Insufficient documentation

## 2023-10-16 DIAGNOSIS — M65341 Trigger finger, right ring finger: Secondary | ICD-10-CM | POA: Insufficient documentation

## 2023-10-16 NOTE — Therapy (Signed)
 OUTPATIENT OCCUPATIONAL THERAPY ORTHO TREATMENT  [name] MRN: [id number] DOB:08/26/1960, 63 y.o., female Today's Date: 10/16/2023  PCP: Dr Oneil Pinal REFERRING PROVIDER: Dr Oneil Pinal  END OF SESSION:  OT End of Session - 10/16/23 1400     Visit Number 15    Number of Visits 20    Date for OT Re-Evaluation 11/20/23    OT Start Time 1400    OT Stop Time 1456    OT Time Calculation (min) 56 min    Activity Tolerance Patient tolerated treatment well    Behavior During Therapy WFL for tasks assessed/performed           Past Medical History:  Diagnosis Date   B12 deficiency    Endometrial cancer (HCC)    GERD (gastroesophageal reflux disease)    No past surgical history on file. 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) 12/11/2018   Left hand weakness 12/09/2018   Malignant neoplasm of endometrium (HCC) 10/28/2013    ONSET DATE: Jan 25  REFERRING DIAG: Right fourth trigger finger  THERAPY DIAG:  Trigger finger, right ring finger  Trigger finger, right middle finger  Pain in right hand  Muscle weakness (generalized)  Rationale for Evaluation and Treatment: Rehabilitation  SUBJECTIVE:   SUBJECTIVE STATEMENT: Pain is better.  Is mostly in the joint when I do the motion.  I do that several times during the day.   Accompanied by: self  PERTINENT HISTORY: PCP Dr Pinal Note 04/17/23 Chief Complaint  Patient presents with  Finger Pain  Patient complains of ring and small finger of the right hand x 2 months.   SUBJECTIVE: Patient has a trigger ring finger on the right, successfully has had it treated with steroid iontophoresis by physical therapy. Has upcoming scans   PRECAUTIONS: None  WEIGHT BEARING RESTRICTIONS: No  PAIN:   Are you having pain?  Pain decreasing 1/10 pain 4th R A1pulley   FALLS: Has patient fallen in last 6 months? 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  OBJECTIVE:  Note: Objective measures were completed at Evaluation unless otherwise noted.  HAND DOMINANCE: Right  ADLs: I have to pinch everything with my index and middle and thumb.  I cannot grab things -any gripping or lifting or carrying my finger locks  FUNCTIONAL OUTCOME MEASURES: Neck session  UPPER EXTREMITY ROM:     Active ROM Right eval Left eval  Shoulder flexion    Shoulder abduction    Shoulder adduction    Shoulder extension    Shoulder internal rotation    Shoulder external rotation    Elbow flexion    Elbow extension    Wrist flexion    Wrist extension    Wrist ulnar deviation    Wrist radial deviation    Wrist pronation    Wrist supination    (Blank rows = not tested)  Active ROM Right eval Left eval R 07/31/23 R 08/03/23 R 09/17/23 R  10/09/23  Thumb MCP (0-60)        Thumb IP (0-80)  Thumb Radial abd/add (0-55)         Thumb Palmar abd/add (0-45)         Thumb Opposition to Small Finger         Index MCP (0-90) 80        Index PIP (0-100) 100        Index DIP (0-70)          Long MCP (0-90) 90         Long PIP (0-100) 100         Long DIP (0-70)          Ring MCP (0-90) 80    P 90 P90 P90 P90  Ring PIP (0-100)  75   P80 P90 P90 P90  Ring DIP (0-70)     P45 P55 P60 P65  Little MCP (0-90) 90         Little PIP (0-100)  95        Little DIP (0-70)          (Blank rows = not tested)    HAND FUNCTION: Grip strength: Right: 40 lbs; Left: 65 lbs, Lateral pinch: Right: 17 lbs, Left: 18 lbs, and 3 point pinch: Right: 15 lbs, Left: 15 lbs pain with gripping of R hand 09/17/23 Grip  strength: Right: 30 lbs; Left: 65 lbs, Lateral pinch: Right: 11 lbs, Left: 18 lbs, and 3 point pinch: Right: 14 lbs, Left: 15 10/16/23 Grip strength: Right: 42 lbs; Left: 61 lbs, Lateral pinch: Right: 18 lbs, Left: 21 lbs, and 3 point pinch: Right: 16 lbs, Left: 19 lbs    COORDINATION: Patient compensating using only 3-point pinch avoiding flexion of the fourth digit  SENSATION: Did not any sensory issues  EDEMA: Edema over the fourth A1 pulley on the right  COGNITION: Overall cognitive status: Within functional limits for tasks assessed  TREATMENT DATE: 10/16/23        Great improvement in grip and prehension strength see flowsheet Improvement in pain and tenderness in the right fourth digit -1/10 Per patient the cupping in the right palm and forearm helped a lot.  Pain is better and tenderness and tightness   Contrast: Pt seen for use of contrast to bilateral hand and digits to decrease edema, pain and increase tissue mobility, performed prior to range of motion exercises.    8 min - alternating heat 2 min and cold 1 min total of 8 minutes  Manual therapy:  Pt seen for therapist directed manual soft tissue massage to right hand to mobilize tissue, promote lymphatic drainage and decrease adhesions.  Use of Graston tool nr 2 for sweeping and brushing over volar 4th digit prior to performing ROM exercises. Graston tool nr 6 done sweeping over volar forearm - increase tightness in proximal forearm flexors  pt to cont to do at home rolling over red foam roller for soft tissue mobs prior to forearm stretch - ext arm and in supination  5 reps hold 5 sec   Some dry cupping 5 cm cups done 2 at a time over the dorsal extensors on forearm  proximal  and middle as well as volar palm  with a light pressure and static with AAROM wrist flexion extension 10 reps.  And digits extention   Also done PROM composite flexion wrist and digits Repeated 3 times  Decrease tightness and trigger points in  forearm and hand   Fabricated patient a DIP/PIP flexion strap for right fourth digit-use at this date  during contrast and heat. Patient can use for 2 to 3 minutes 5 times a day to increase intrinsic a fist prior to passive range of motion pain-free for composite flexion   Therapeutic Exercises:                                                                                                           Passive range of motion to fourth MC with PIP extention 10 reps Passive range of motion of her DIP/PIP for flexion, intrinsics pain free at PIP - REINFORCE not to over do Composite PROM light pain free range, 10 reps- stop when feeling pull only in clinic Cues for exercises and reminder for no AROM for flexion at this point to avoid triggering.   NO AROM recommended to avoid triggering and while decreasing inflammation to the area.  Modalities: Iontophoresis:  Type: Dexamethasone Location: R 4th A1pulley Dose: med patch at current 2.0 -  kept hand flat to keep patch in contact to tolerate intensity and able to tolerate ionto  Time: 20 min Skin check performed prior to ionto with no issues and pt to keep patch on for hour afterwards    PATIENT EDUCATION: Education details: findings of eval and HEP /splint wearing and iontophoresis Person educated: Patient Education method: Explanation, Demonstration, Tactile cues, Verbal cues, and Handouts Education comprehension: verbalized understanding, returned demonstration, verbal cues required, and needs further education    GOALS: Goals reviewed with patient? Yes  LONG TERM GOALS: Target date: 8 wks  Patient to be independent in home program for modalities, passive range of motion and modifications to decrease pain to less than 2/10 Baseline: Tenderness over fourth A1 pulley 8/10 with triggering every attempt of gripping or flexion-no knowledge of home program NOW did improve but then missed 3-4 wks because of Cancer Treatment - 5-6/10 pain   Goal status: progressing  2.  Pain and tenderness over the right fourth digit decreased to less than 1/10 for patient flexion of fourth digit improving to touching palm with no triggering Baseline: Decreased flexion of fourth digit 80 MC flexion PIP 75.  With 8/10 pain. NOW pain decrease 5-6/10  PROM 90 MC, 90 PIP and 60DIP since - increase symptoms because of missing OT - had 3-4 wks  cancer treatment Goal status:progressing   3.  Flexion of right digits improved for patient to use in ADLs and IADLs with less than 2 triggers today Baseline: Every attempt of making a fist or flexion of fourth digit on the right patient's finger triggering pain and tenderness 8/10 patient avoiding composite grip and using 2 and 3-point pinch NOW triggering cont if using and pain  5-6/10 was decrease but increase since missed OT 3-4 wks with cancer treatment  Goal status: progressing  4.  Grip strength improved in right hand with 10 pounds with no triggering for patient to return to prior level of function Baseline: Triggering every attempt of flexion.  8/10 pain over the A1 pulley of the fourth digit.  Grip on the right 40 pounds with pain left  65 pounds. NOW improved but then missed 3-4 wks because of cancer treatmtnet Goal status: progressing   ASSESSMENT:  CLINICAL IMPRESSION: Patient seen for OT  for right fourth digit trigger finger since January 25.  Patient was seen 2 years ago by this OT for bilateral thumb triggering, with great improvement.  Patient referred by her PCP.  Patient with 8/10 pain over her right fourth A1 pulley at eval.  Report triggering every attempt of gripping or composite flexion.  Decreased MCP flexion of 80 and PIP 75 on the right fourth digit.  Grip decreased by 25 pounds compared to the left with 8/10 pain at eval. NOW pt was making great progress  in pain , ROM and triggering - but missed 3-4 wks of OT receiving cancer treatment she return to OT 09/17/23 - cot to show decrease pain  and tenderness to 1/10 at the A1 pulley of the right fourth.  Patient also report after doing manual therapy cupping on the volar forearm and palm great improvement in pain and flexibility with less tightness.  Patient's grip and prehension strength improved greatly.  Fabricated patient DIP/PIP flexion strap to use every 2 hours to increase intrinsic fist.  Tolerate PROM better-   iontophoresis with dexamethasone again-  Reinforced with patient again to wear her splint when performing active tasks to help support finger to avoid triggering. Patient limited in functional use of right dominant hand in ADLs and IADLs.  Patient can benefit from skilled OT services to decrease pain and edema increase motion and increase strength to return to prior level of function.  PERFORMANCE DEFICITS: in functional skills including ADLs, IADLs, ROM, strength, pain, flexibility, decreased knowledge of use of DME, and UE functional use,   and psychosocial skills including environmental adaptation and routines and behaviors.   IMPAIRMENTS: are limiting patient from ADLs, IADLs, rest and sleep, play, leisure, and social participation.   COMORBIDITIES: has no other co-morbidities that affects occupational performance. Patient will benefit from skilled OT to address above impairments and improve overall function.  MODIFICATION OR ASSISTANCE TO COMPLETE EVALUATION: No modification of tasks or assist necessary to complete an evaluation.  OT OCCUPATIONAL PROFILE AND HISTORY: Problem focused assessment: Including review of records relating to presenting problem.  CLINICAL DECISION MAKING: LOW - limited treatment options, no task modification necessary  REHAB POTENTIAL: Good for goals  EVALUATION COMPLEXITY: Low  PLAN:  OT FREQUENCY: 1-2x wk   OT DURATION: 8 wks  PLANNED INTERVENTIONS: 97168 OT Re-evaluation, 97535 self care/ADL training, 02889 therapeutic exercise, 97530 therapeutic activity, 97140 manual therapy,  97035 ultrasound, 97039 fluidotherapy, 97034 contrast bath, 97033 iontophoresis, 97760 Orthotic Initial, [id number] Orthotic/Prosthetic subsequent, passive range of motion, patient/family education, and DME and/or AE instructions  CONSULTED AND AGREED WITH PLAN OF CARE: Patient   Ancel Peters, OTR/L,CLT 10/16/2023, 5:06 PM

## 2023-10-18 ENCOUNTER — Ambulatory Visit: Admitting: Occupational Therapy

## 2023-10-18 DIAGNOSIS — M65331 Trigger finger, right middle finger: Secondary | ICD-10-CM

## 2023-10-18 DIAGNOSIS — M6281 Muscle weakness (generalized): Secondary | ICD-10-CM

## 2023-10-18 DIAGNOSIS — M65341 Trigger finger, right ring finger: Secondary | ICD-10-CM

## 2023-10-18 DIAGNOSIS — M79641 Pain in right hand: Secondary | ICD-10-CM

## 2023-10-18 NOTE — Therapy (Signed)
 OUTPATIENT OCCUPATIONAL THERAPY ORTHO TREATMENT  [name] MRN: [id number] DOB:April 01, 1960, 63 y.o., female Today's Date: 10/18/2023  PCP: Dr Oneil Pinal REFERRING PROVIDER: Dr Oneil Pinal  END OF SESSION:  OT End of Session - 10/18/23 1534     Visit Number 16    Number of Visits 20    Date for OT Re-Evaluation 11/20/23    OT Start Time 1534    OT Stop Time 1624    OT Time Calculation (min) 50 min    Activity Tolerance Patient tolerated treatment well    Behavior During Therapy WFL for tasks assessed/performed           Past Medical History:  Diagnosis Date   B12 deficiency    Endometrial cancer (HCC)    GERD (gastroesophageal reflux disease)    No past surgical history on file. 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) 12/11/2018   Left hand weakness 12/09/2018   Malignant neoplasm of endometrium (HCC) 10/28/2013    ONSET DATE: Jan 25  REFERRING DIAG: Right fourth trigger finger  THERAPY DIAG:  Trigger finger, right ring finger  Trigger finger, right middle finger  Pain in right hand  Muscle weakness (generalized)  Rationale for Evaluation and Treatment: Rehabilitation  SUBJECTIVE:   SUBJECTIVE STATEMENT: Pain is better.  When I feel it I feel within that middle joint.  I forgot middle strep yesterday but I did not use it today.  I think my card is better than I can get in the paraffin Accompanied by: self  PERTINENT HISTORY: PCP Dr Pinal Note 04/17/23 Chief Complaint  Patient presents with  Finger Pain  Patient complains of ring and small finger of the right hand x 2 months.   SUBJECTIVE: Patient has a trigger ring finger on the right, successfully has had it treated with steroid iontophoresis by physical therapy. Has  upcoming scans   PRECAUTIONS: None  WEIGHT BEARING RESTRICTIONS: No  PAIN:  Are you having pain?  Pain decreasing 1/10 pain 4th R A1pulley   FALLS: Has patient fallen in last 6 months? 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  OBJECTIVE:  Note: Objective measures were completed at Evaluation unless otherwise noted.  HAND DOMINANCE: Right  ADLs: I have to pinch everything with my index and middle and thumb.  I cannot grab things -any gripping or lifting or carrying my finger locks  FUNCTIONAL OUTCOME MEASURES: Neck session  UPPER EXTREMITY ROM:     Active ROM Right eval Left eval  Shoulder flexion    Shoulder abduction    Shoulder adduction    Shoulder extension    Shoulder internal rotation    Shoulder external rotation    Elbow flexion    Elbow extension    Wrist flexion    Wrist extension    Wrist ulnar deviation    Wrist radial deviation    Wrist pronation    Wrist supination    (Blank rows = not tested)  Active ROM Right eval Left eval R 07/31/23 R 08/03/23 R 09/17/23 R  10/09/23 R 10/18/23  Thumb  MCP (0-60)         Thumb IP (0-80)         Thumb Radial abd/add (0-55)          Thumb Palmar abd/add (0-45)          Thumb Opposition to Small Finger          Index MCP (0-90) 80         Index PIP (0-100) 100         Index DIP (0-70)           Long MCP (0-90) 90          Long PIP (0-100) 100          Long DIP (0-70)           Ring MCP (0-90) 80    P 90 P90 P90 P90 90  Ring PIP (0-100)  75   P80 P90 P90 P90 95  Ring DIP (0-70)     P45 P55 P60 P65 65  Little MCP (0-90) 90          Little PIP (0-100)  95         Little DIP (0-70)           (Blank rows = not tested)    HAND FUNCTION: Grip strength: Right: 40 lbs; Left: 65 lbs, Lateral pinch: Right: 17  lbs, Left: 18 lbs, and 3 point pinch: Right: 15 lbs, Left: 15 lbs pain with gripping of R hand 09/17/23 Grip strength: Right: 30 lbs; Left: 65 lbs, Lateral pinch: Right: 11 lbs, Left: 18 lbs, and 3 point pinch: Right: 14 lbs, Left: 15 10/16/23 Grip strength: Right: 42 lbs; Left: 61 lbs, Lateral pinch: Right: 18 lbs, Left: 21 lbs, and 3 point pinch: Right: 16 lbs, Left: 19 lbs    COORDINATION: Patient compensating using only 3-point pinch avoiding flexion of the fourth digit  SENSATION: Did not any sensory issues  EDEMA: Edema over the fourth A1 pulley on the right  COGNITION: Overall cognitive status: Within functional limits for tasks assessed  TREATMENT DATE: 10/18/23        Great improvement in grip and prehension strength last visit see flowsheet Improvement in pain and tenderness in the right fourth digit A1 pulley 1/10 Pain mostly with composite flexion at the PIP of the fourth right 2/10 Per patient the cupping in the right palm and forearm helped a lot.  Pain is better and tenderness and tightness    Paraffin done to hands 8 minutes to decrease stiffness and decrease pain at PIP.  With composite flexion and intrinsic of flexion.  Focusing on passive followed by placing hold. Was able to do placement hold 2-3 times in the right without triggering  Manual therapy:  Pt seen for therapist directed manual soft tissue massage to right hand to mobilize tissue, promote lymphatic drainage and decrease adhesions.  Use of Graston tool nr 2 for sweeping and brushing over volar 4th digit prior to performing ROM exercises. Graston tool nr 6 done sweeping over volar forearm - increase tightness in proximal forearm flexors  pt to cont to do at home rolling over red foam roller for soft tissue mobs prior to forearm stretch - ext arm and in supination  5 reps hold 5 sec   Some dry cupping 5 cm cups done 2 at a time over the dorsal extensors on forearm  proximal  and middle as well as volar forearm  2 as well as  1 on palm  with a light pressure and static with PROM wrist flexion extension with extended elbow 10 reps.  And digits extention   Repeated 3 times  Decrease tightness and trigger points in forearm and hand   Patient to continue with DIP/PIP flexion strap for right fourth digit-use at this date during contrast and heat. Patient can use for 2 to 3 minutes 5 times a day to increase intrinsic a fist prior to passive range of motion pain-free for composite flexion   Therapeutic Exercises:                                                                                                           Passive range of motion to fourth MC with PIP extention 10 reps Passive range of motion of her DIP/PIP for flexion, intrinsics pain free at PIP - REINFORCE not to over do Composite PROM light pain free range, 10 reps-  Was able to do placing hold composite 1 time prior to paraffin on the right and then after  Manual and passive range of motion 3 times with no triggering Cues for exercises and reminder for no AROM for flexion at this point to avoid triggering.   NO AROM recommended to avoid triggering and while decreasing inflammation to the area.  Modalities: Iontophoresis:  Type: Dexamethasone Location: R 4th A1pulley Dose: med patch at current 2.0 -  kept hand flat to keep patch in contact to tolerate intensity and able to tolerate ionto  Time: 20 min Skin check performed prior to ionto with no issues and pt to keep patch on for hour afterwards    PATIENT EDUCATION: Education details: findings of eval and HEP /splint wearing and iontophoresis Person educated: Patient Education method: Explanation, Demonstration, Tactile cues, Verbal cues, and Handouts Education comprehension: verbalized understanding, returned demonstration, verbal cues required, and needs further education    GOALS: Goals reviewed with patient? Yes  LONG TERM GOALS: Target date: 8 wks  Patient to be  independent in home program for modalities, passive range of motion and modifications to decrease pain to less than 2/10 Baseline: Tenderness over fourth A1 pulley 8/10 with triggering every attempt of gripping or flexion-no knowledge of home program NOW did improve but then missed 3-4 wks because of Cancer Treatment - 5-6/10 pain  Goal status: progressing  2.  Pain and tenderness over the right fourth digit decreased to less than 1/10 for patient flexion of fourth digit improving to touching palm with no triggering Baseline: Decreased flexion of fourth digit 80 MC flexion PIP 75.  With 8/10 pain. NOW pain decrease 5-6/10  PROM 90 MC, 90 PIP and 60DIP since - increase symptoms because of missing OT - had 3-4 wks  cancer treatment Goal status:progressing   3.  Flexion of right digits improved for patient to use in ADLs and IADLs with less than 2 triggers today Baseline: Every attempt of making a fist or flexion of fourth digit on the right patient's finger triggering pain and tenderness 8/10 patient avoiding composite grip and using 2  and 3-point pinch NOW triggering cont if using and pain  5-6/10 was decrease but increase since missed OT 3-4 wks with cancer treatment  Goal status: progressing  4.  Grip strength improved in right hand with 10 pounds with no triggering for patient to return to prior level of function Baseline: Triggering every attempt of flexion.  8/10 pain over the A1 pulley of the fourth digit.  Grip on the right 40 pounds with pain left 65 pounds. NOW improved but then missed 3-4 wks because of cancer treatmtnet Goal status: progressing   ASSESSMENT:  CLINICAL IMPRESSION: Patient seen for OT  for right fourth digit trigger finger since January 25.  Patient was seen 2 years ago by this OT for bilateral thumb triggering, with great improvement.  Patient referred by her PCP.  Patient with 8/10 pain over her right fourth A1 pulley at eval.  Report triggering every attempt of gripping  or composite flexion.  Decreased MCP flexion of 80 and PIP 75 on the right fourth digit.  Grip decreased by 25 pounds compared to the left with 8/10 pain at eval. NOW pt was making great progress  in pain , ROM and triggering - but missed 3-4 wks of OT receiving cancer treatment she return to OT 09/17/23 - cot to show decrease pain and tenderness to 1/10 at the A1 pulley of the right fourth.  Patient also report after doing manual therapy cupping on the volar forearm and palm great improvement in pain and flexibility with less tightness.  Patient's grip and prehension strength improved greatly.  Patient to continue with DIP/PIP flexion strap to use every 2 hours to increase intrinsic fist.  Tolerate PROM better-   iontophoresis with dexamethasone again-initiated paraffin today to decrease stiffness and increase passive range of motion.  Reinforced with patient again to wear her splint when performing active tasks to help support finger to avoid triggering. Patient limited in functional use of right dominant hand in ADLs and IADLs.  Patient can benefit from skilled OT services to decrease pain and edema increase motion and increase strength to return to prior level of function.  PERFORMANCE DEFICITS: in functional skills including ADLs, IADLs, ROM, strength, pain, flexibility, decreased knowledge of use of DME, and UE functional use,   and psychosocial skills including environmental adaptation and routines and behaviors.   IMPAIRMENTS: are limiting patient from ADLs, IADLs, rest and sleep, play, leisure, and social participation.   COMORBIDITIES: has no other co-morbidities that affects occupational performance. Patient will benefit from skilled OT to address above impairments and improve overall function.  MODIFICATION OR ASSISTANCE TO COMPLETE EVALUATION: No modification of tasks or assist necessary to complete an evaluation.  OT OCCUPATIONAL PROFILE AND HISTORY: Problem focused assessment: Including  review of records relating to presenting problem.  CLINICAL DECISION MAKING: LOW - limited treatment options, no task modification necessary  REHAB POTENTIAL: Good for goals  EVALUATION COMPLEXITY: Low  PLAN:  OT FREQUENCY: 1-2x wk   OT DURATION: 8 wks  PLANNED INTERVENTIONS: 97168 OT Re-evaluation, 97535 self care/ADL training, 02889 therapeutic exercise, 97530 therapeutic activity, 97140 manual therapy, 97035 ultrasound, 97039 fluidotherapy, 97034 contrast bath, 97033 iontophoresis, 97760 Orthotic Initial, [id number] Orthotic/Prosthetic subsequent, passive range of motion, patient/family education, and DME and/or AE instructions  CONSULTED AND AGREED WITH PLAN OF CARE: Patient   Ancel Peters, OTR/L,CLT 10/18/2023, 4:14 PM

## 2023-10-23 ENCOUNTER — Ambulatory Visit: Admitting: Occupational Therapy

## 2023-10-23 DIAGNOSIS — M65341 Trigger finger, right ring finger: Secondary | ICD-10-CM

## 2023-10-23 DIAGNOSIS — M6281 Muscle weakness (generalized): Secondary | ICD-10-CM

## 2023-10-23 DIAGNOSIS — M79641 Pain in right hand: Secondary | ICD-10-CM

## 2023-10-23 DIAGNOSIS — M65331 Trigger finger, right middle finger: Secondary | ICD-10-CM

## 2023-10-23 NOTE — Therapy (Signed)
 OUTPATIENT OCCUPATIONAL THERAPY ORTHO TREATMENT  [name] MRN: [id number] DOB:07-06-1960, 63 y.o., female Today's Date: 10/23/2023  PCP: Dr Oneil Pinal REFERRING PROVIDER: Dr Oneil Pinal  END OF SESSION:  OT End of Session - 10/23/23 1529     Visit Number 17    Number of Visits 20    Date for OT Re-Evaluation 11/20/23    OT Start Time 1529    OT Stop Time 1625    OT Time Calculation (min) 56 min    Activity Tolerance Patient tolerated treatment well    Behavior During Therapy WFL for tasks assessed/performed           Past Medical History:  Diagnosis Date   B12 deficiency    Endometrial cancer (HCC)    GERD (gastroesophageal reflux disease)    No past surgical history on file. 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) 12/11/2018   Left hand weakness 12/09/2018   Malignant neoplasm of endometrium (HCC) 10/28/2013    ONSET DATE: Jan 25  REFERRING DIAG: Right fourth trigger finger  THERAPY DIAG:  Trigger finger, right ring finger  Trigger finger, right middle finger  Pain in right hand  Muscle weakness (generalized)  Rationale for Evaluation and Treatment: Rehabilitation  SUBJECTIVE:   SUBJECTIVE STATEMENT: I worked my finger today every 2 hrs - wearing that little white strap on the right ring finger-left ring finger is triggering but it does not hurt Accompanied by: self  PERTINENT HISTORY: PCP Dr Pinal Note 04/17/23 Chief Complaint  Patient presents with  Finger Pain  Patient complains of ring and small finger of the right hand x 2 months.   SUBJECTIVE: Patient has a trigger ring finger on the right, successfully has had it treated with steroid iontophoresis by physical therapy. Has upcoming scans   PRECAUTIONS:  None  WEIGHT BEARING RESTRICTIONS: No  PAIN:  Are you having pain?  2/10 discomfort at the PIP with flexion and in the session 1-2/10 tenderness over the A1 pulley of the right fourth  FALLS: Has patient fallen in last 6 months? 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  OBJECTIVE:  Note: Objective measures were completed at Evaluation unless otherwise noted.  HAND DOMINANCE: Right  ADLs: I have to pinch everything with my index and middle and thumb.  I cannot grab things -any gripping or lifting or carrying my finger locks  FUNCTIONAL OUTCOME MEASURES: Neck session  UPPER EXTREMITY ROM:     Active ROM Right eval Left eval  Shoulder flexion    Shoulder abduction    Shoulder adduction    Shoulder extension    Shoulder internal rotation    Shoulder external rotation    Elbow flexion    Elbow extension    Wrist flexion    Wrist extension    Wrist ulnar deviation    Wrist radial deviation    Wrist pronation    Wrist supination    (Blank rows = not tested)  Active ROM Right eval Left eval R 07/31/23 R 08/03/23 R 09/17/23 R  10/09/23 R 10/18/23  Thumb  MCP (0-60)         Thumb IP (0-80)         Thumb Radial abd/add (0-55)          Thumb Palmar abd/add (0-45)          Thumb Opposition to Small Finger          Index MCP (0-90) 80         Index PIP (0-100) 100         Index DIP (0-70)           Long MCP (0-90) 90          Long PIP (0-100) 100          Long DIP (0-70)           Ring MCP (0-90) 80    P 90 P90 P90 P90 90  Ring PIP (0-100)  75   P80 P90 P90 P90 95  Ring DIP (0-70)     P45 P55 P60 P65 65  Little MCP (0-90) 90          Little PIP (0-100)  95         Little DIP (0-70)           (Blank rows = not tested)    HAND FUNCTION: Grip strength: Right: 40  lbs; Left: 65 lbs, Lateral pinch: Right: 17 lbs, Left: 18 lbs, and 3 point pinch: Right: 15 lbs, Left: 15 lbs pain with gripping of R hand 09/17/23 Grip strength: Right: 30 lbs; Left: 65 lbs, Lateral pinch: Right: 11 lbs, Left: 18 lbs, and 3 point pinch: Right: 14 lbs, Left: 15 10/16/23 Grip strength: Right: 42 lbs; Left: 61 lbs, Lateral pinch: Right: 18 lbs, Left: 21 lbs, and 3 point pinch: Right: 16 lbs, Left: 19 lbs    COORDINATION: Patient compensating using only 3-point pinch avoiding flexion of the fourth digit  SENSATION: Did not any sensory issues  EDEMA: Edema over the fourth A1 pulley on the right  COGNITION: Overall cognitive status: Within functional limits for tasks assessed  TREATMENT DATE: 10/23/23         Improvement in pain and tenderness in the right fourth digit A1 pulley 1/10 Pain mostly with composite flexion at the PIP of the fourth right 2/10 And after the passive range of motion increased to 2/10 tenderness over the A1 pulley Per patient the cupping in the right palm and forearm helped a lot.  Pain is better and tenderness and tightness    Paraffin done to hand 8 minutes to decrease stiffness and decrease pain at PIP.  With composite flexion and intrinsic of flexion.   Focusing on passive range of motion for DIP and PIP and intrinsic  Followed by active range of motion intrinsic  was able to do placement hold 2-3 times in the right without triggering  Manual therapy:  Pt seen for therapist directed manual soft tissue massage to right hand to mobilize tissue, promote lymphatic drainage and decrease adhesions.  Use of Graston tool nr 2 for sweeping and brushing over volar 4th digit prior to performing ROM exercises. Graston tool nr 6 done sweeping over volar forearm - increase tightness in proximal forearm flexors  pt to cont to do at home rolling over red foam roller for soft tissue mobs prior to forearm stretch - ext arm and in supination  5 reps hold 5 sec    Some dry cupping 5 cm cups done 2 at a time  over the dorsal extensors on forearm  proximal  and middle as well as volar forearm 2 as well as 1 on palm  with a light pressure and static with PROM wrist flexion extension with extended elbow 10 reps.  And digits extention   Repeated 3 times  Decrease tightness and trigger points in forearm and hand   Patient to continue with DIP/PIP flexion strap for right fourth digit-use at this date during contrast and heat. Patient can use for 2 to 3 minutes 5 times a day to increase intrinsic a fist prior to passive range of motion pain-free for composite flexion   Therapeutic Exercises:                                                                                                           Passive range of motion to fourth MC with PIP extention 10 reps Passive range of motion of her DIP/PIP for flexion, intrinsics pain free at PIP - REINFORCE not to over do Composite PROM light pain free range, 10 reps-  Was able to do placing hold composite 1 time prior to paraffin on the right and then after  Manual and passive range of motion 3 times with no triggering Cues for exercises and reminder for no AROM for flexion at this point to avoid triggering.   NO AROM recommended to avoid triggering and while decreasing inflammation to the area.  Modalities: Iontophoresis:  Type: Dexamethasone Location: R 4th A1pulley Dose: med patch at current 1.7 and decrease to 1.5  -  kept hand flat to keep patch in contact to tolerate intensity and able to tolerate ionto  Time: 24 min Skin check performed prior to ionto with no issues and pt to keep patch on for hour afterwards    PATIENT EDUCATION: Education details: findings of eval and HEP /splint wearing and iontophoresis Person educated: Patient Education method: Explanation, Demonstration, Tactile cues, Verbal cues, and Handouts Education comprehension: verbalized understanding, returned demonstration, verbal  cues required, and needs further education    GOALS: Goals reviewed with patient? Yes  LONG TERM GOALS: Target date: 8 wks  Patient to be independent in home program for modalities, passive range of motion and modifications to decrease pain to less than 2/10 Baseline: Tenderness over fourth A1 pulley 8/10 with triggering every attempt of gripping or flexion-no knowledge of home program NOW did improve but then missed 3-4 wks because of Cancer Treatment - 5-6/10 pain  Goal status: progressing  2.  Pain and tenderness over the right fourth digit decreased to less than 1/10 for patient flexion of fourth digit improving to touching palm with no triggering Baseline: Decreased flexion of fourth digit 80 MC flexion PIP 75.  With 8/10 pain. NOW pain decrease 5-6/10  PROM 90 MC, 90 PIP and 60DIP since - increase symptoms because of missing OT - had 3-4 wks  cancer treatment Goal status:progressing   3.  Flexion of right digits improved for patient to use in ADLs and IADLs with less than 2 triggers today Baseline: Every attempt of  making a fist or flexion of fourth digit on the right patient's finger triggering pain and tenderness 8/10 patient avoiding composite grip and using 2 and 3-point pinch NOW triggering cont if using and pain  5-6/10 was decrease but increase since missed OT 3-4 wks with cancer treatment  Goal status: progressing  4.  Grip strength improved in right hand with 10 pounds with no triggering for patient to return to prior level of function Baseline: Triggering every attempt of flexion.  8/10 pain over the A1 pulley of the fourth digit.  Grip on the right 40 pounds with pain left 65 pounds. NOW improved but then missed 3-4 wks because of cancer treatmtnet Goal status: progressing   ASSESSMENT:  CLINICAL IMPRESSION: Patient seen for OT  for right fourth digit trigger finger since January 25.  Patient was seen 2 years ago by this OT for bilateral thumb triggering, with great  improvement.  Patient referred by her PCP.  Patient with 8/10 pain over her right fourth A1 pulley at eval.  Report triggering every attempt of gripping or composite flexion.  Decreased MCP flexion of 80 and PIP 75 on the right fourth digit.  Grip decreased by 25 pounds compared to the left with 8/10 pain at eval. NOW pt was making great progress  in pain , ROM and triggering - but missed 3-4 wks of OT receiving cancer treatment she return to OT 09/17/23 - cot to show decrease pain and tenderness to 1/10 at the A1 pulley of the right fourth.  Patient also report after doing manual therapy cupping on the volar forearm and palm great improvement in pain and flexibility with less tightness.  Patient's grip and prehension strength improved greatly.  Patient to continue with DIP/PIP flexion strap to use every 2 hours to increase intrinsic fist.  Tolerate PROM better-  and focus on intrinsic block AROM -   iontophoresis with dexamethasone again-initiated paraffin today to decrease stiffness and increase passive range of motion.  Reinforced with patient again to wear her splint when performing active tasks to help support finger to avoid triggering. Patient limited in functional use of right dominant hand in ADLs and IADLs.  Patient can benefit from skilled OT services to decrease pain and edema increase motion and increase strength to return to prior level of function.  PERFORMANCE DEFICITS: in functional skills including ADLs, IADLs, ROM, strength, pain, flexibility, decreased knowledge of use of DME, and UE functional use,   and psychosocial skills including environmental adaptation and routines and behaviors.   IMPAIRMENTS: are limiting patient from ADLs, IADLs, rest and sleep, play, leisure, and social participation.   COMORBIDITIES: has no other co-morbidities that affects occupational performance. Patient will benefit from skilled OT to address above impairments and improve overall function.  MODIFICATION OR  ASSISTANCE TO COMPLETE EVALUATION: No modification of tasks or assist necessary to complete an evaluation.  OT OCCUPATIONAL PROFILE AND HISTORY: Problem focused assessment: Including review of records relating to presenting problem.  CLINICAL DECISION MAKING: LOW - limited treatment options, no task modification necessary  REHAB POTENTIAL: Good for goals  EVALUATION COMPLEXITY: Low  PLAN:  OT FREQUENCY: 1-2x wk   OT DURATION: 8 wks  PLANNED INTERVENTIONS: 97168 OT Re-evaluation, 97535 self care/ADL training, 02889 therapeutic exercise, 97530 therapeutic activity, 97140 manual therapy, 97035 ultrasound, 97039 fluidotherapy, 97034 contrast bath, 97033 iontophoresis, 97760 Orthotic Initial, [id number] Orthotic/Prosthetic subsequent, passive range of motion, patient/family education, and DME and/or AE instructions  CONSULTED AND AGREED WITH PLAN OF CARE:  Patient   Emily Soto, OTR/L,CLT 10/23/2023, 4:18 PM

## 2023-10-25 ENCOUNTER — Ambulatory Visit: Admitting: Occupational Therapy

## 2023-10-30 ENCOUNTER — Ambulatory Visit: Admitting: Occupational Therapy

## 2023-10-30 DIAGNOSIS — M6281 Muscle weakness (generalized): Secondary | ICD-10-CM

## 2023-10-30 DIAGNOSIS — M79641 Pain in right hand: Secondary | ICD-10-CM

## 2023-10-30 DIAGNOSIS — M65331 Trigger finger, right middle finger: Secondary | ICD-10-CM

## 2023-10-30 DIAGNOSIS — M65341 Trigger finger, right ring finger: Secondary | ICD-10-CM

## 2023-10-30 NOTE — Therapy (Signed)
 OUTPATIENT OCCUPATIONAL THERAPY ORTHO TREATMENT  [name] MRN: [id number] DOB:04/24/1960, 63 y.o., female Today's Date: 10/30/2023  PCP: Dr Oneil Pinal REFERRING PROVIDER: Dr Oneil Pinal  END OF SESSION:  OT End of Session - 10/30/23 1534     Visit Number 18    Number of Visits 20    Date for OT Re-Evaluation 11/20/23    OT Start Time 1535    Activity Tolerance Patient tolerated treatment well    Behavior During Therapy [facility] for tasks assessed/performed           Past Medical History:  Diagnosis Date   B12 deficiency    Endometrial cancer (HCC)    GERD (gastroesophageal reflux disease)    No past surgical history on file. 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) 12/11/2018   Left hand weakness 12/09/2018   Malignant neoplasm of endometrium (HCC) 10/28/2013    ONSET DATE: Jan 25  REFERRING DIAG: Right fourth trigger finger  THERAPY DIAG:  Trigger finger, right ring finger  Trigger finger, right middle finger  Pain in right hand  Muscle weakness (generalized)  Rationale for Evaluation and Treatment: Rehabilitation  SUBJECTIVE:   SUBJECTIVE STATEMENT: On following Monday to [facility] and [place]  to get my PET scan and follow-up.  But I lost my MC block splint in my strap to help with my bending of finger.  Accompanied by: self  PERTINENT HISTORY: PCP Dr Pinal Note 04/17/23 Chief Complaint  Patient presents with  Finger Pain  Patient complains of ring and small finger of the right hand x 2 months.   SUBJECTIVE: Patient has a trigger ring finger on the right, successfully has had it treated with steroid iontophoresis by physical therapy. Has upcoming scans   PRECAUTIONS: None  WEIGHT BEARING RESTRICTIONS: No  PAIN:   Are you having pain?  1/10 discomfort at the PIP with flexion and in the session 1-2/10 tenderness over the A1 pulley of the right fourth  FALLS: Has patient fallen in last 6 months? 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  OBJECTIVE:  Note: Objective measures were completed at Evaluation unless otherwise noted.  HAND DOMINANCE: Right  ADLs: I have to pinch everything with my index and middle and thumb.  I cannot grab things -any gripping or lifting or carrying my finger locks  FUNCTIONAL OUTCOME MEASURES: Neck session  UPPER EXTREMITY ROM:     Active ROM Right eval Left eval  Shoulder flexion    Shoulder abduction    Shoulder adduction    Shoulder extension    Shoulder internal rotation    Shoulder external rotation    Elbow flexion    Elbow extension    Wrist flexion    Wrist extension    Wrist ulnar deviation    Wrist radial deviation    Wrist pronation    Wrist supination    (Blank rows = not tested)  Active ROM Right eval Left eval R 07/31/23 R 08/03/23 R 09/17/23 R  10/09/23 R 10/18/23  Thumb MCP (0-60)  Thumb IP (0-80)         Thumb Radial abd/add (0-55)          Thumb Palmar abd/add (0-45)          Thumb Opposition to Small Finger          Index MCP (0-90) 80         Index PIP (0-100) 100         Index DIP (0-70)           Long MCP (0-90) 90          Long PIP (0-100) 100          Long DIP (0-70)           Ring MCP (0-90) 80    P 90 P90 P90 P90 90  Ring PIP (0-100)  75   P80 P90 P90 P90 95  Ring DIP (0-70)     P45 P55 P60 P65 65  Little MCP (0-90) 90          Little PIP (0-100)  95         Little DIP (0-70)           (Blank rows = not tested)    HAND FUNCTION: Grip strength: Right: 40 lbs; Left: 65 lbs, Lateral pinch: Right: 17 lbs,  Left: 18 lbs, and 3 point pinch: Right: 15 lbs, Left: 15 lbs pain with gripping of R hand 09/17/23 Grip strength: Right: 30 lbs; Left: 65 lbs, Lateral pinch: Right: 11 lbs, Left: 18 lbs, and 3 point pinch: Right: 14 lbs, Left: 15 10/16/23 Grip strength: Right: 42 lbs; Left: 61 lbs, Lateral pinch: Right: 18 lbs, Left: 21 lbs, and 3 point pinch: Right: 16 lbs, Left: 19 lbs  10/30/23 Grip strength: Right: 42 lbs; Left: 61 lbs, Lateral pinch: Right: 18 lbs, Left: 21 lbs, and 3 point pinch: Right: 16 lbs, Left: 19 lbs  COORDINATION: Patient compensating using only 3-point pinch avoiding flexion of the fourth digit  SENSATION: Did not any sensory issues  EDEMA: Edema over the fourth A1 pulley on the right  COGNITION: Overall cognitive status: Within functional limits for tasks assessed  TREATMENT DATE: 10/30/23         Improvement in pain and tenderness in the right fourth digit A1 pulley 1/10 Pain mostly with composite flexion at the PIP of the fourth right 2/10 And after the passive range of motion increased to 2/10 tenderness at PIP joint Per patient the cupping in the right palm and forearm helped a lot.  Pain is better and tenderness and tightness    Paraffin done to hand 8 minutes to decrease stiffness and decrease pain at PIP.  With composite flexion and intrinsic of flexion.   Coban flexion strap to DIP/PIP flexion  Tolerated well   Manual therapy:  Pt seen for therapist directed manual soft tissue massage to right hand to mobilize tissue, promote lymphatic drainage and decrease adhesions.  Use of Graston tool nr 2 for sweeping and brushing over volar 4th digit prior to performing ROM exercises. Graston tool nr 6 done sweeping over volar forearm - increase tightness in proximal forearm flexors  pt to cont to do at home rolling over red foam roller for soft tissue mobs prior to forearm stretch - ext arm and in supination  5 reps hold 5 sec   Some dry cupping 5 cm cups done 2 at a time  over the dorsal extensors on forearm  proximal  and middle as well as volar forearm 2 as well as 1 on palm  with a light pressure and static with PROM wrist flexion extension with extended elbow 10 reps.  And digits extention   Repeated 3 times  Decrease tightness and trigger points in forearm and hand   Fabricated for patient a new DIP/PIP flexion strap for right fourth digit-to use several times during the day to increase flexion distally prior to composite Patient can use for 2 to 3 minutes 5 times a day to increase intrinsic a fist prior to passive range of motion pain-free for composite flexion Also fabricated a custom MC block splint for patient to sleep with as well as during the day use to avoid composite flexion.  Patient again educated on donning and doffing as well as wearing correctly.  Therapeutic Exercises:                                                                                                           Passive range of motion to fourth MC with PIP extention 10 reps Passive range of motion of her DIP/PIP for flexion, intrinsics pain free at PIP - REINFORCE not to over do Composite PROM light pain free range, 10 reps-  Was able to do placing hold composite 1 time prior to paraffin on the right and then after  Manual and passive range of motion 3 times with no triggering Cues for exercises and reminder for no AROM for flexion at this point to avoid triggering.   NO AROM recommended to avoid triggering and while decreasing inflammation to the area.    PATIENT EDUCATION: Education details: findings of eval and HEP /splint wearing and iontophoresis Person educated: Patient Education method: Explanation, Demonstration, Tactile cues, Verbal cues, and Handouts Education comprehension: verbalized understanding, returned demonstration, verbal cues required, and needs further education    GOALS: Goals reviewed with patient? Yes  LONG TERM GOALS: Target date: 8  wks  Patient to be independent in home program for modalities, passive range of motion and modifications to decrease pain to less than 2/10 Baseline: Tenderness over fourth A1 pulley 8/10 with triggering every attempt of gripping or flexion-no knowledge of home program NOW did improve but then missed 3-4 wks because of Cancer Treatment - 5-6/10 pain  Goal status: progressing  2.  Pain and tenderness over the right fourth digit decreased to less than 1/10 for patient flexion of fourth digit improving to touching palm with no triggering Baseline: Decreased flexion of fourth digit 80 MC flexion PIP 75.  With 8/10 pain. NOW pain decrease 5-6/10  PROM 90 MC, 90 PIP and 60DIP since - increase symptoms because of missing OT - had 3-4 wks  cancer treatment Goal status:progressing   3.  Flexion of right digits improved for patient to use in ADLs and IADLs with less than 2 triggers today Baseline: Every attempt of making a fist or flexion of fourth digit on the right patient's finger triggering pain and tenderness 8/10 patient avoiding composite grip and using 2 and 3-point pinch  NOW triggering cont if using and pain  5-6/10 was decrease but increase since missed OT 3-4 wks with cancer treatment  Goal status: progressing  4.  Grip strength improved in right hand with 10 pounds with no triggering for patient to return to prior level of function Baseline: Triggering every attempt of flexion.  8/10 pain over the A1 pulley of the fourth digit.  Grip on the right 40 pounds with pain left 65 pounds. NOW improved but then missed 3-4 wks because of cancer treatmtnet Goal status: progressing   ASSESSMENT:  CLINICAL IMPRESSION: Patient seen for OT  for right fourth digit trigger finger since January 25.  Patient was seen 2 years ago by this OT for bilateral thumb triggering, with great improvement.  Patient referred by her PCP.  Patient with 8/10 pain over her right fourth A1 pulley at eval.  Report triggering  every attempt of gripping or composite flexion.  Decreased MCP flexion of 80 and PIP 75 on the right fourth digit.  Grip decreased by 25 pounds compared to the left with 8/10 pain at eval. NOW pt was making great progress  in pain , ROM and triggering - but missed 3-4 wks of OT receiving cancer treatment she return to OT 09/17/23 - cot to show decrease pain and tenderness to 1/10 at the A1 pulley of the right fourth.  Patient also report after doing manual therapy cupping on the volar forearm and palm great improvement in pain and flexibility with less tightness.  Patient's grip and prehension strength improved greatly.  I did fabricate patient a custom new  DIP/PIP flexion strap to use every 2 hours to increase intrinsic fist.  As well as a new custom MC block splint to avoid composite flexion to decrease triggering and pain.  Patient last traversed.  Patient flying Monday [facility] [place]  [facility].  Tolerate PROM better-  and focus on intrinsic block AROM - cont paraffin today to decrease stiffness and increase passive range of motion.  Reinforced with patient again to wear her splint when performing active tasks to help support finger to avoid triggering. Patient limited in functional use of right dominant hand in ADLs and IADLs.  Patient can benefit from skilled OT services to decrease pain and edema increase motion and increase strength to return to prior level of function.  PERFORMANCE DEFICITS: in functional skills including ADLs, IADLs, ROM, strength, pain, flexibility, decreased knowledge of use of DME, and UE functional use,   and psychosocial skills including environmental adaptation and routines and behaviors.   IMPAIRMENTS: are limiting patient from ADLs, IADLs, rest and sleep, play, leisure, and social participation.   COMORBIDITIES: has no other co-morbidities that affects occupational performance. Patient will benefit from skilled OT to address above impairments and improve overall  function.  MODIFICATION OR ASSISTANCE TO COMPLETE EVALUATION: No modification of tasks or assist necessary to complete an evaluation.  OT OCCUPATIONAL PROFILE AND HISTORY: Problem focused assessment: Including review of records relating to presenting problem.  CLINICAL DECISION MAKING: LOW - limited treatment options, no task modification necessary  REHAB POTENTIAL: Good for goals  EVALUATION COMPLEXITY: Low  PLAN:  OT FREQUENCY: 1-2x wk   OT DURATION: 8 wks  PLANNED INTERVENTIONS: 97168 OT Re-evaluation, 97535 self care/ADL training, 02889 therapeutic exercise, 97530 therapeutic activity, 97140 manual therapy, 97035 ultrasound, 97039 fluidotherapy, 97034 contrast bath, 97033 iontophoresis, 97760 Orthotic Initial, [id number] Orthotic/Prosthetic subsequent, passive range of motion, patient/family education, and DME and/or AE instructions  CONSULTED AND AGREED  WITH PLAN OF CARE: Patient   Ancel Peters, OTR/L,CLT 10/30/2023, 3:35 PM

## 2023-11-01 ENCOUNTER — Ambulatory Visit: Admitting: Occupational Therapy

## 2023-11-08 ENCOUNTER — Ambulatory Visit: Admitting: Occupational Therapy

## 2023-11-13 ENCOUNTER — Ambulatory Visit: Admitting: Occupational Therapy

## 2023-11-15 ENCOUNTER — Ambulatory Visit: Admitting: Occupational Therapy

## 2023-11-29 ENCOUNTER — Ambulatory Visit: Admitting: Occupational Therapy

## 2023-12-04 ENCOUNTER — Ambulatory Visit: Admitting: Occupational Therapy

## 2023-12-11 ENCOUNTER — Ambulatory Visit: Admitting: Occupational Therapy
